# Patient Record
Sex: Male | Born: 1950 | Race: White | Hispanic: No | Smoking: Former smoker
Health system: Southern US, Community
[De-identification: ages and names within clinical notes are randomized; demographics above are authoritative.]

## PROBLEM LIST (undated history)

## (undated) DIAGNOSIS — D1721 Benign lipomatous neoplasm of skin and subcutaneous tissue of right arm: Secondary | ICD-10-CM

## (undated) DIAGNOSIS — K219 Gastro-esophageal reflux disease without esophagitis: Secondary | ICD-10-CM

## (undated) DIAGNOSIS — R0600 Dyspnea, unspecified: Secondary | ICD-10-CM

## (undated) DIAGNOSIS — I10 Essential (primary) hypertension: Secondary | ICD-10-CM

## (undated) DIAGNOSIS — I350 Nonrheumatic aortic (valve) stenosis: Secondary | ICD-10-CM

## (undated) DIAGNOSIS — I5022 Chronic systolic (congestive) heart failure: Secondary | ICD-10-CM

## (undated) HISTORY — DX: Nonrheumatic aortic (valve) stenosis: I35.0

## (undated) HISTORY — DX: Chronic systolic (congestive) heart failure: I50.22

## (undated) HISTORY — PX: HAND SURGERY: SHX662

---

## 2010-01-13 ENCOUNTER — Encounter: Payer: Self-pay | Admitting: Cardiology

## 2010-01-15 ENCOUNTER — Encounter: Payer: Self-pay | Admitting: Cardiology

## 2010-01-15 ENCOUNTER — Ambulatory Visit: Payer: Self-pay | Admitting: Cardiology

## 2010-01-25 ENCOUNTER — Ambulatory Visit: Payer: Self-pay | Admitting: Cardiology

## 2010-01-25 DIAGNOSIS — I10 Essential (primary) hypertension: Secondary | ICD-10-CM | POA: Insufficient documentation

## 2010-01-25 DIAGNOSIS — R42 Dizziness and giddiness: Secondary | ICD-10-CM | POA: Insufficient documentation

## 2010-01-25 DIAGNOSIS — I359 Nonrheumatic aortic valve disorder, unspecified: Secondary | ICD-10-CM | POA: Insufficient documentation

## 2010-02-24 ENCOUNTER — Ambulatory Visit: Payer: Self-pay | Admitting: Cardiology

## 2010-02-24 ENCOUNTER — Ambulatory Visit: Payer: Self-pay

## 2010-06-15 NOTE — Progress Notes (Signed)
Summary: Office Visit/ [facility]  Office Visit/ [facility]   Imported By: Dorise Hiss 01/25/2010 08:37:14  _____________________________________________________________________  External Attachment:    Type:   Image     Comment:   External Document

## 2010-06-15 NOTE — Assessment & Plan Note (Signed)
Summary: NP-AORTIC STENOSIS/LVH   Visit Type:  Initial Consult Primary Provider:  Lonzo Candy  CC:  Aortic Stenosis.  History of Present Illness: The patient presents for evaluation of an abnormal echocardiogram. He had a heart murmur and was recently sent for a stress echocardiogram which demonstrated a probable bicuspid aortic valve with a peak gradient of 34 with moderate to severe aortic stenosis. There was moderate left ventricular hypertrophy with a well-preserved ejection fraction. The mitral valve demonstrated annular calcification. The patient has not seen physicians frequently in the past. He has not known of a heart murmur. He has been active as a Chiropractor. With this activity he denies any chest pressure, neck or arm discomfort. He denies any shortness of breath, PND or orthopnea. He has had some rare episodes of lightheadedness that pass quickly and are infrequent. He has not had any presyncope or syncope. 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  Preventive Screening-Counseling & Management  Alcohol-Tobacco     Smoking Status: quit     Year Quit: 2008     Cans of tobacco/week: chews tobacco 2 pks/week     Tobacco Counseling: not to resume use of tobacco products  Current Medications (verified): 1)  Aspir-Low 81 Mg Tbec (Aspirin) .... Take 1 Tablet By Mouth Once A Day 2)  Vitamin C 500 Mg Tabs (Ascorbic Acid) .... Take 1 Tablet By Mouth Once A Day 3)  Multivitamins  Tabs (Multiple Vitamin) .... Take 1 Tablet By Mouth Once A Day  Allergies (verified): No Known Drug Allergies  Comments:  Nurse/Medical Assistant: The patient's medication list and allergies were reviewed with the patient and were updated in the Medication and Allergy Lists.  Past History:  Past Medical History: Aortic Stenosis  Past Surgical History: Left hand surgery following trauma  Family History: Father died of lung CA Mother heart murmur Brothers  died of lung CA  Social History: Chews tobacco 2 cans/per week Quit tobacco 3 years ago after 40 years 2 ppd No alcohol No drug use Married Scientist, water quality  Smoking Status:  quit Cans of tobacco/week:  chews tobacco 2 pks/week  Review of Systems       As stated in the HPI and negative for all other systems.   Vital Signs:  Patient profile:   60 year old male Height:      68 inches Weight:      164 pounds BMI:     25.03 Pulse rate:   99 / minute BP sitting:   145 / 85  (left arm) Cuff size:   regular  Vitals Entered By: Carlye Grippe (January 25, 2010 3:20 PM)  Nutrition Counseling: Patient's BMI is greater than 25 and therefore counseled on weight management options.  Physical Exam  General:  Well developed, well nourished, in no acute distress. Head:  normocephalic and atraumatic Eyes:  PERRLA/EOM intact; conjunctiva and lids normal. Mouth:  edentulous. Oral mucosa normal. Neck:  Neck supple, no JVD. No masses, thyromegaly or abnormal cervical nodes. Chest Wall:  no deformities or breast masses noted Lungs:  Clear bilaterally to auscultation and percussion. Abdomen:  Bowel sounds positive; abdomen soft and non-tender without masses, organomegaly, or hernias noted. No hepatosplenomegaly. Msk:  Back normal, normal gait. Muscle strength and tone normal. Extremities:  No clubbing or cyanosis. Neurologic:  Alert and oriented x 3. Skin:  Intact without lesions or rashes. Cervical Nodes:  no significant adenopathy Axillary Nodes:  no significant adenopathy  Inguinal Nodes:  no significant adenopathy Psych:  Normal affect.   Detailed Cardiovascular Exam  Neck    Carotids: Carotids full and equal bilaterally without bruits.      Neck Veins: Normal, no JVD.    Heart    Inspection: no deformities or lifts noted.      Palpation: normal PMI with no thrills palpable.      Auscultation: Probable opening snap, S1 and S2 within normal limits, no S3, no S4, 3/6 mid peaking  systolic murmur heard best at the apex and aortic outflow tract, no diastolic murmurs  Vascular    Abdominal Aorta: no palpable masses, pulsations, or audible bruits.      Femoral Pulses: normal femoral pulses bilaterally.      Pedal Pulses: normal pedal pulses bilaterally.      Radial Pulses: normal radial pulses bilaterally.      Peripheral Circulation: no clubbing, cyanosis, or edema noted with normal capillary refill.     EKG  Procedure date:  01/25/2010  Findings:      Sinus rhythm, rate 94, left gingiva hypertrophy, no acute ST-T wave changes  Impression & Recommendations:  Problem # 1:  AORTIC VALVE DISORDERS (ICD-424.1) The patient has moderately severe aortic stenosis.  I reviewed with the patient and his wife this anatomy in detail. It's unclear to me whether any of the dizziness he has described could be related to this. At this point I'm going to put him on an exercise treadmill to see if his exercise tolerance is truly as good as he suspects. If he does well with this I will plan followup echocardiography in 6 months or sooner if he has any symptoms. We discussed at length the symptoms that could develop when this becomes severe to the point of needing valve replacement. Orders: EKG w/ Interpretation (93000) GXT (GXT)  Problem # 2:  DIZZINESS AND GIDDINESS (ICD-780.4) As above  Problem # 3:  ESSENTIAL HYPERTENSION, BENIGN (ICD-401.1) His blood pressure is slightly elevated. I will observe this at the time of his treadmill as well.  Patient Instructions: 1)  Exercise stress test - in [employer] office 2)  Follow up based on results of above

## 2011-11-22 ENCOUNTER — Encounter: Payer: Self-pay | Admitting: Cardiology

## 2017-03-02 ENCOUNTER — Encounter: Payer: Self-pay | Admitting: Cardiology

## 2017-03-02 ENCOUNTER — Ambulatory Visit (INDEPENDENT_AMBULATORY_CARE_PROVIDER_SITE_OTHER): Payer: BLUE CROSS/BLUE SHIELD | Admitting: Cardiology

## 2017-03-02 VITALS — BP 122/82 | HR 94 | Ht 69.0 in | Wt 168.0 lb

## 2017-03-02 DIAGNOSIS — I350 Nonrheumatic aortic (valve) stenosis: Secondary | ICD-10-CM

## 2017-03-02 DIAGNOSIS — Z8774 Personal history of (corrected) congenital malformations of heart and circulatory system: Secondary | ICD-10-CM | POA: Diagnosis not present

## 2017-03-02 DIAGNOSIS — I10 Essential (primary) hypertension: Secondary | ICD-10-CM

## 2017-03-02 NOTE — Patient Instructions (Signed)
Medication Instructions:  Your physician recommends that you continue on your current medications as directed. Please refer to the Current Medication list given to you today.   Labwork: I will request labs from pcp  Testing/Procedures: Your physician has requested that you have an echocardiogram. Echocardiography is a painless test that uses sound waves to create images of your heart. It provides your doctor with information about the size and shape of your heart and how well your heart's chambers and valves are working. This procedure takes approximately one hour. There are no restrictions for this procedure.  Non-Cardiac CT Angiography (CTA), is a special type of CT scan that uses a computer to produce multi-dimensional views of major blood vessels throughout the body. In CT angiography, a contrast material is injected through an IV to help visualize the blood vessels   Follow-Up: Your physician recommends that you schedule a follow-up appointment in: to be determined    Any Other Special Instructions Will Be Listed Below (If Applicable).     If you need a refill on your cardiac medications before your next appointment, please call your pharmacy.

## 2017-03-02 NOTE — Progress Notes (Addendum)
Clinical Summary Jeremy Larson is a 66 y.o.male seen today as a new consult, referred by Dr Quillian Quince for history of bicuspid aortic valve and aortic stenosis.   1. Aortic stenosis/Bicuspid aortic valve - probable bicuspid AV by prior echo.  - echo 01/2010 LVEF 60-65%, bicuspid AV mod AS mean grad 34 and AVA VTI 1.12 - he has not followed up with cardiology in several years.  - DOE at < 1/2 block, DOE 1 flight of stairs. Can have some chest tightness with exertion. No syncope   SH: brick mason. His grandaughter is also a patient, [name], who also has a bicuspid aortic valve.    Past Medical History:  Diagnosis Date  . Aortic stenosis      Allergies not on file   Current Outpatient Prescriptions  Medication Sig Dispense Refill  . aspirin 81 MG tablet Take 81 mg by mouth daily.    . Multiple Vitamins-Minerals (MULTIVITAMIN PO) Take by mouth.    . vitamin C (ASCORBIC ACID) 500 MG tablet Take 500 mg by mouth daily.     No current facility-administered medications for this visit.      Past Surgical History:  Procedure Laterality Date  . HAND SURGERY     left hang following trauma     Allergies not on file    Family History  Problem Relation Age of Onset  . Lung cancer Unknown        brother and father  . Heart murmur Unknown        mother     Social History Mr. Reiland reports that he has quit smoking. He uses smokeless tobacco. Mr. Otting reports that he does not drink alcohol.   Review of Systems CONSTITUTIONAL: No weight loss, fever, chills, weakness or fatigue.  HEENT: Eyes: No visual loss, blurred vision, double vision or yellow sclerae.No hearing loss, sneezing, congestion, runny nose or sore throat.  SKIN: No rash or itching.  CARDIOVASCULAR: per hpi RESPIRATORY: per hpi GASTROINTESTINAL: No anorexia, nausea, vomiting or diarrhea. No abdominal pain or blood.  GENITOURINARY: No burning on urination, no polyuria NEUROLOGICAL: No headache, dizziness,  syncope, paralysis, ataxia, numbness or tingling in the extremities. No change in bowel or bladder control.  MUSCULOSKELETAL: No muscle, back pain, joint pain or stiffness.  LYMPHATICS: No enlarged nodes. No history of splenectomy.  PSYCHIATRIC: No history of depression or anxiety.  ENDOCRINOLOGIC: No reports of sweating, cold or heat intolerance. No polyuria or polydipsia.  Marland Kitchen   Physical Examination Vitals:   03/02/17 0855 03/02/17 0901  BP: 122/82 122/82  Pulse: 94   SpO2: 99%    Vitals:   03/02/17 0855  Weight: 168 lb (76.2 kg)  Height: 5\' 9"  (1.753 m)    Gen: resting comfortably, no acute distress HEENT: no scleral icterus, pupils equal round and reactive, no palptable cervical adenopathy,  CV: RRR, 3/6 systolic murmur rusb, no jvd Resp: Clear to auscultation bilaterally GI: abdomen is soft, non-tender, non-distended, normal bowel sounds, no hepatosplenomegaly MSK: extremities are warm, no edema.  Skin: warm, no rash Neuro:  no focal deficits Psych: appropriate affect    Assessment and Plan  1. Aortic stenosis/bicuspid aortic valve - recent symptoms likely related to progression of his aortic stenosis. We will repeat echo - in setting of bicuspid AV obtain CTA entire aorta to evaluate for underlying aortopathy.  - pending results may require further workup with LHC/RHC, possible valve intervention - EKG today sinus rhythm, LVH with strain pattern likely  due to AS   F/u pending test results.    11/201/18 addendum I have reviewed the risks, indications, and alternatives to cardiac catheterization, possible angioplasty, and stenting with the patient . Risks include but are not limited to bleeding, infection, vascular injury, stroke, myocardial infection, arrhythmia, kidney injury, radiation-related injury in the case of prolonged fluoroscopy use, emergency cardiac surgery, and death. The patient understands the risks of serious complication is 1-2 in 3154 with diagnostic  cardiac cath and 1-2% or less with angioplasty/stenting.   Arnoldo Lenis, M.D.

## 2017-03-07 ENCOUNTER — Other Ambulatory Visit (HOSPITAL_COMMUNITY): Payer: BLUE CROSS/BLUE SHIELD

## 2017-03-14 ENCOUNTER — Ambulatory Visit (HOSPITAL_COMMUNITY): Payer: BLUE CROSS/BLUE SHIELD

## 2017-03-23 ENCOUNTER — Ambulatory Visit (HOSPITAL_COMMUNITY): Payer: BLUE CROSS/BLUE SHIELD

## 2017-04-03 ENCOUNTER — Telehealth: Payer: Self-pay

## 2017-04-03 ENCOUNTER — Ambulatory Visit (HOSPITAL_COMMUNITY)
Admission: RE | Admit: 2017-04-03 | Discharge: 2017-04-03 | Disposition: A | Discharge status: Home / Self Care | Payer: Medicare Other | Source: Ambulatory Visit | Attending: Cardiology | Admitting: Cardiology

## 2017-04-03 DIAGNOSIS — I350 Nonrheumatic aortic (valve) stenosis: Secondary | ICD-10-CM

## 2017-04-03 DIAGNOSIS — I700 Atherosclerosis of aorta: Secondary | ICD-10-CM | POA: Insufficient documentation

## 2017-04-03 DIAGNOSIS — K4020 Bilateral inguinal hernia, without obstruction or gangrene, not specified as recurrent: Secondary | ICD-10-CM | POA: Diagnosis not present

## 2017-04-03 DIAGNOSIS — N281 Cyst of kidney, acquired: Secondary | ICD-10-CM | POA: Insufficient documentation

## 2017-04-03 DIAGNOSIS — I70208 Unspecified atherosclerosis of native arteries of extremities, other extremity: Secondary | ICD-10-CM | POA: Insufficient documentation

## 2017-04-03 DIAGNOSIS — Z01818 Encounter for other preprocedural examination: Secondary | ICD-10-CM

## 2017-04-03 DIAGNOSIS — Z8774 Personal history of (corrected) congenital malformations of heart and circulatory system: Secondary | ICD-10-CM

## 2017-04-03 LAB — ECHOCARDIOGRAM COMPLETE
AO mean calculated velocity dopler: 338 cm/s
AOVTI: 123 cm
AV Area VTI index: 0.2 cm2/m2
AV Area mean vel: 0.43 cm2
AV area mean vel ind: 0.22 cm2/m2
AVA: 0.39 cm2
AVAREAVTI: 0.4 cm2
AVCELMEANRAT: 0.12
AVG: 51 mmHg
AVLVOTPG: 1 mmHg
AVPG: 86 mmHg
AVPKVEL: 465 cm/s
Ao pk vel: 0.12 m/s
CHL CUP AV PEAK INDEX: 0.21
CHL CUP AV VEL: 0.39
CHL CUP MV DEC (S): 225
CHL CUP RV SYS PRESS: 27 mmHg
E decel time: 225 msec
EERAT: 16.89
FS: 16 % — AB (ref 28–44)
IVS/LV PW RATIO, ED: 1.03
LA ID, A-P, ES: 41 mm
LA diam end sys: 41 mm
LA vol index: 33.8 mL/m2
LADIAMINDEX: 2.12 cm/m2
LAVOL: 65.3 mL
LAVOLA4C: 57 mL
LV PW d: 13.6 mm — AB (ref 0.6–1.1)
LV SIMPSON'S DISK: 38
LV TDI E'MEDIAL: 4.46
LV dias vol index: 73 mL/m2
LV dias vol: 142 mL (ref 62–150)
LV sys vol index: 46 mL/m2
LV sys vol: 88 mL — AB
LVEEAVG: 16.89
LVEEMED: 16.89
LVELAT: 4.24 cm/s
LVOT VTI: 13.8 cm
LVOT area: 3.46 cm2
LVOT diameter: 21 mm
LVOT peak VTI: 0.11 cm
LVOT peak vel: 54.3 cm/s
LVOTSV: 48 mL
MV Peak grad: 2 mmHg
MV pk A vel: 102 m/s
MV pk E vel: 71.6 m/s
RV LATERAL S' VELOCITY: 6.85 cm/s
Reg peak vel: 247 cm/s
Stroke v: 54 ml
TAPSE: 14.8 mm
TDI e' lateral: 4.24
TR max vel: 247 cm/s
Valve area index: 0.2

## 2017-04-03 LAB — POCT I-STAT CREATININE: CREATININE: 1.3 mg/dL — AB (ref 0.61–1.24)

## 2017-04-03 MED ORDER — LISINOPRIL 2.5 MG PO TABS: 2.5000 mg | ORAL_TABLET | Freq: Every day | ORAL | 3 refills | Status: DC

## 2017-04-03 MED ORDER — IOPAMIDOL (ISOVUE-370) INJECTION 76%
100.0000 mL | Freq: Once | INTRAVENOUS | Status: AC | PRN
  Administered 2017-04-03: 100 mL via INTRAVENOUS

## 2017-04-03 NOTE — Telephone Encounter (Signed)
Pt returned call. I advised him of test results and next step. I will schedule cath. & send  In lisinopril 2.5 mg  To [facility].

## 2017-04-03 NOTE — Telephone Encounter (Signed)
Returned pt call. Informed him of Cath instructions. He will have labs done Wednesday 11/21

## 2017-04-03 NOTE — Telephone Encounter (Signed)
Jeremy Larson called back requesting to speak with Jeremy Larson. Please call patient.

## 2017-04-03 NOTE — Telephone Encounter (Signed)
Called pt. No answer. Left message for pt. To return call.  

## 2017-04-03 NOTE — Telephone Encounter (Signed)
-----   Message from Jeremy Lenis, MD sent at 04/03/2017 12:12 PM EST ----- Echo shows that his aortic valve is severely thickened and not opening well, this is putting strain on the heart and has caused his heart muscle/pump to become weak. Please start lisinopril 2.5mg  daily, this medicine can help strengthen his heart. Ultimately he is going to need to have his heart valve replaced in the very near future. Please arrange a LHC/RHC with Dr Burt Knack or [facility] for severe aortic stenosis, this is the next step in workup getting him ready for possible valve replacement.    Carlyle Dolly MD

## 2017-04-03 NOTE — Telephone Encounter (Signed)
-----   Message from Arnoldo Lenis, MD sent at 04/03/2017 12:12 PM EST ----- Echo shows that his aortic valve is severely thickened and not opening well, this is putting strain on the heart and has caused his heart muscle/pump to become weak. Please start lisinopril 2.5mg  daily, this medicine can help strengthen his heart. Ultimately he is going to need to have his heart valve replaced in the very near future. Please arrange a LHC/RHC with Dr Burt Knack or [facility] for severe aortic stenosis, this is the next step in workup getting him ready for possible valve replacement.    Carlyle Dolly MD

## 2017-04-03 NOTE — Progress Notes (Signed)
*  PRELIMINARY RESULTS* Echocardiogram 2D Echocardiogram has been performed.  Jeremy Larson 04/03/2017, 9:59 AM

## 2017-04-04 ENCOUNTER — Other Ambulatory Visit: Payer: Self-pay | Admitting: Cardiology

## 2017-04-04 DIAGNOSIS — I350 Nonrheumatic aortic (valve) stenosis: Secondary | ICD-10-CM

## 2017-04-05 ENCOUNTER — Other Ambulatory Visit (HOSPITAL_COMMUNITY)
Admission: RE | Admit: 2017-04-05 | Discharge: 2017-04-05 | Disposition: A | Discharge status: Home / Self Care | Payer: Medicare Other | Source: Ambulatory Visit | Attending: Cardiology | Admitting: Cardiology

## 2017-04-05 DIAGNOSIS — Z01818 Encounter for other preprocedural examination: Secondary | ICD-10-CM | POA: Diagnosis present

## 2017-04-05 LAB — BASIC METABOLIC PANEL
Anion gap: 7 (ref 5–15)
BUN: 15 mg/dL (ref 6–20)
CALCIUM: 9.6 mg/dL (ref 8.9–10.3)
CO2: 27 mmol/L (ref 22–32)
CREATININE: 1.27 mg/dL — AB (ref 0.61–1.24)
Chloride: 102 mmol/L (ref 101–111)
GFR calc non Af Amer: 58 mL/min — ABNORMAL LOW (ref >60)
Glucose, Bld: 88 mg/dL (ref 65–99)
Potassium: 4.5 mmol/L (ref 3.5–5.1)
SODIUM: 136 mmol/L (ref 135–145)

## 2017-04-05 LAB — CBC WITH DIFFERENTIAL/PLATELET
BASOS PCT: 0 %
Basophils Absolute: 0 10*3/uL (ref 0.0–0.1)
EOS ABS: 0.2 10*3/uL (ref 0.0–0.7)
EOS PCT: 4 %
HEMATOCRIT: 46.1 % (ref 39.0–52.0)
HEMOGLOBIN: 15.1 g/dL (ref 13.0–17.0)
Lymphocytes Relative: 38 %
Lymphs Abs: 2.2 10*3/uL (ref 0.7–4.0)
MCH: 31.5 pg (ref 26.0–34.0)
MCHC: 32.8 g/dL (ref 30.0–36.0)
MCV: 96 fL (ref 78.0–100.0)
MONOS PCT: 13 %
Monocytes Absolute: 0.8 10*3/uL (ref 0.1–1.0)
NEUTROS PCT: 45 %
Neutro Abs: 2.6 10*3/uL (ref 1.7–7.7)
Platelets: 223 10*3/uL (ref 150–400)
RBC: 4.8 MIL/uL (ref 4.22–5.81)
RDW: 13 % (ref 11.5–15.5)
WBC: 5.9 10*3/uL (ref 4.0–10.5)

## 2017-04-05 LAB — PROTIME-INR
INR: 0.89
Prothrombin Time: 11.9 seconds (ref 11.4–15.2)

## 2017-04-11 ENCOUNTER — Telehealth: Payer: Self-pay

## 2017-04-11 NOTE — Telephone Encounter (Signed)
Patient contacted pre-catheterization at [facility] scheduled for:  04/12/2017 @ 1130 Verified arrival time and place:  NT @ 0900 Confirmed AM meds to be taken pre-cath with sip of water: Take ASA Confirmed patient has responsible person to drive home post procedure and observe patient for 24 hours: yes Addl concerns:  none

## 2017-04-12 ENCOUNTER — Ambulatory Visit (HOSPITAL_COMMUNITY)
Admission: RE | Admit: 2017-04-12 | Discharge: 2017-04-12 | Disposition: A | Discharge status: Home / Self Care | Payer: Medicare Other | Source: Ambulatory Visit | Attending: Cardiovascular Disease | Admitting: Cardiovascular Disease

## 2017-04-12 ENCOUNTER — Encounter (HOSPITAL_COMMUNITY)
Admission: RE | Disposition: A | Discharge status: Home / Self Care | Payer: Self-pay | Source: Ambulatory Visit | Attending: Cardiovascular Disease

## 2017-04-12 DIAGNOSIS — I2510 Atherosclerotic heart disease of native coronary artery without angina pectoris: Secondary | ICD-10-CM | POA: Insufficient documentation

## 2017-04-12 DIAGNOSIS — Z720 Tobacco use: Secondary | ICD-10-CM | POA: Diagnosis not present

## 2017-04-12 DIAGNOSIS — Z7982 Long term (current) use of aspirin: Secondary | ICD-10-CM | POA: Diagnosis not present

## 2017-04-12 DIAGNOSIS — I428 Other cardiomyopathies: Secondary | ICD-10-CM | POA: Insufficient documentation

## 2017-04-12 DIAGNOSIS — I350 Nonrheumatic aortic (valve) stenosis: Secondary | ICD-10-CM | POA: Diagnosis present

## 2017-04-12 HISTORY — PX: RIGHT/LEFT HEART CATH AND CORONARY ANGIOGRAPHY: CATH118266

## 2017-04-12 LAB — POCT I-STAT 3, ART BLOOD GAS (G3+)
Acid-Base Excess: 2 mmol/L (ref 0.0–2.0)
Bicarbonate: 28.1 mmol/L — ABNORMAL HIGH (ref 20.0–28.0)
O2 SAT: 94 %
PH ART: 7.374 (ref 7.350–7.450)
TCO2: 30 mmol/L (ref 22–32)
pCO2 arterial: 48.2 mmHg — ABNORMAL HIGH (ref 32.0–48.0)
pO2, Arterial: 75 mmHg — ABNORMAL LOW (ref 83.0–108.0)

## 2017-04-12 LAB — POCT I-STAT 3, VENOUS BLOOD GAS (G3P V)
Acid-Base Excess: 1 mmol/L (ref 0.0–2.0)
BICARBONATE: 27.3 mmol/L (ref 20.0–28.0)
O2 SAT: 65 %
PCO2 VEN: 50.5 mmHg (ref 44.0–60.0)
PH VEN: 7.341 (ref 7.250–7.430)
PO2 VEN: 36 mmHg (ref 32.0–45.0)
TCO2: 29 mmol/L (ref 22–32)

## 2017-04-12 SURGERY — RIGHT/LEFT HEART CATH AND CORONARY ANGIOGRAPHY
Anesthesia: LOCAL

## 2017-04-12 MED ORDER — SODIUM CHLORIDE 0.9% FLUSH: 3.0000 mL | INTRAVENOUS | Status: DC | PRN

## 2017-04-12 MED ORDER — SODIUM CHLORIDE 0.9% FLUSH: 3.0000 mL | Freq: Two times a day (BID) | INTRAVENOUS | Status: DC

## 2017-04-12 MED ORDER — LIDOCAINE HCL (PF) 1 % IJ SOLN
INTRAMUSCULAR | Status: AC
  Filled 2017-04-12: qty 30

## 2017-04-12 MED ORDER — SODIUM CHLORIDE 0.9 % IV SOLN: 250.0000 mL | INTRAVENOUS | Status: DC | PRN

## 2017-04-12 MED ORDER — HEPARIN SODIUM (PORCINE) 1000 UNIT/ML IJ SOLN
INTRAMUSCULAR | Status: DC | PRN
  Administered 2017-04-12: 4000 [IU] via INTRAVENOUS

## 2017-04-12 MED ORDER — LIDOCAINE HCL (PF) 1 % IJ SOLN
INTRAMUSCULAR | Status: DC | PRN
  Administered 2017-04-12: 8 mL via SUBCUTANEOUS

## 2017-04-12 MED ORDER — VERAPAMIL HCL 2.5 MG/ML IV SOLN
INTRAVENOUS | Status: DC | PRN
  Administered 2017-04-12: 10 mL via INTRA_ARTERIAL

## 2017-04-12 MED ORDER — HEPARIN (PORCINE) IN NACL 2-0.9 UNIT/ML-% IJ SOLN
INTRAMUSCULAR | Status: AC | PRN
  Administered 2017-04-12: 1000 mL

## 2017-04-12 MED ORDER — HEPARIN (PORCINE) IN NACL 2-0.9 UNIT/ML-% IJ SOLN
INTRAMUSCULAR | Status: AC
  Filled 2017-04-12: qty 1000

## 2017-04-12 MED ORDER — IOPAMIDOL (ISOVUE-370) INJECTION 76%
INTRAVENOUS | Status: AC
  Filled 2017-04-12: qty 100

## 2017-04-12 MED ORDER — SODIUM CHLORIDE 0.9 % IV SOLN: INTRAVENOUS | Status: AC

## 2017-04-12 MED ORDER — FENTANYL CITRATE (PF) 100 MCG/2ML IJ SOLN
INTRAMUSCULAR | Status: AC
  Filled 2017-04-12: qty 2

## 2017-04-12 MED ORDER — VERAPAMIL HCL 2.5 MG/ML IV SOLN
INTRAVENOUS | Status: AC
  Filled 2017-04-12: qty 2

## 2017-04-12 MED ORDER — SODIUM CHLORIDE 0.9 % IV SOLN
INTRAVENOUS | Status: DC
  Administered 2017-04-12: 10:00:00 via INTRAVENOUS

## 2017-04-12 MED ORDER — ASPIRIN 81 MG PO CHEW
CHEWABLE_TABLET | ORAL | Status: AC
  Administered 2017-04-12: 81 mg via ORAL
  Filled 2017-04-12: qty 1

## 2017-04-12 MED ORDER — FENTANYL CITRATE (PF) 100 MCG/2ML IJ SOLN
INTRAMUSCULAR | Status: DC | PRN
  Administered 2017-04-12: 50 ug via INTRAVENOUS
  Administered 2017-04-12: 25 ug via INTRAVENOUS

## 2017-04-12 MED ORDER — MIDAZOLAM HCL 2 MG/2ML IJ SOLN
INTRAMUSCULAR | Status: AC
  Filled 2017-04-12: qty 2

## 2017-04-12 MED ORDER — ASPIRIN 81 MG PO CHEW
81.0000 mg | CHEWABLE_TABLET | ORAL | Status: AC
  Administered 2017-04-12: 81 mg via ORAL

## 2017-04-12 MED ORDER — MIDAZOLAM HCL 2 MG/2ML IJ SOLN
INTRAMUSCULAR | Status: DC | PRN
  Administered 2017-04-12: 1 mg via INTRAVENOUS
  Administered 2017-04-12: 2 mg via INTRAVENOUS

## 2017-04-12 MED ORDER — IOPAMIDOL (ISOVUE-370) INJECTION 76%
INTRAVENOUS | Status: DC | PRN
  Administered 2017-04-12: 90 mL via INTRA_ARTERIAL

## 2017-04-12 SURGICAL SUPPLY — 16 items
CATH BALLN WEDGE 5F 110CM (CATHETERS) ×2 IMPLANT
CATH INFINITI 5 FR AL2 (CATHETERS) ×2 IMPLANT
CATH INFINITI 5 FR JL3.5 (CATHETERS) ×4 IMPLANT
CATH INFINITI 5FR AL1 (CATHETERS) ×2 IMPLANT
CATH INFINITI JR4 5F (CATHETERS) ×2 IMPLANT
DEVICE RAD COMP TR BAND LRG (VASCULAR PRODUCTS) ×2 IMPLANT
GLIDESHEATH SLEND SS 6F .021 (SHEATH) ×2 IMPLANT
GUIDEWIRE .025 260CM (WIRE) ×2 IMPLANT
GUIDEWIRE INQWIRE 1.5J.035X260 (WIRE) ×1 IMPLANT
INQWIRE 1.5J .035X260CM (WIRE) ×2
KIT HEART LEFT (KITS) ×2 IMPLANT
PACK CARDIAC CATHETERIZATION (CUSTOM PROCEDURE TRAY) ×2 IMPLANT
SHEATH GLIDE SLENDER 4/5FR (SHEATH) ×2 IMPLANT
TRANSDUCER W/STOPCOCK (MISCELLANEOUS) ×2 IMPLANT
TUBING CIL FLEX 10 FLL-RA (TUBING) ×2 IMPLANT
WIRE EMERALD ST .035X260CM (WIRE) ×2 IMPLANT

## 2017-04-12 NOTE — Interval H&P Note (Signed)
History and Physical Interval Note:  04/12/2017 10:11 AM  Jeremy Larson  has presented today for cardiac cath with the diagnosis of severe AS. The various methods of treatment have been discussed with the patient and family. After consideration of risks, benefits and other options for treatment, the patient has consented to  Procedure(s): RIGHT/LEFT HEART CATH AND CORONARY ANGIOGRAPHY (N/A) as a surgical intervention .  The patient's history has been reviewed, patient examined, no change in status, stable for surgery.  I have reviewed the patient's chart and labs.  Questions were answered to the patient's satisfaction.    Cath Lab Visit (complete for each Cath Lab visit)  Clinical Evaluation Leading to the Procedure:   ACS: No.  Non-ACS:    Anginal Classification: CCS III  Anti-ischemic medical therapy: Minimal Therapy (1 class of medications)  Non-Invasive Test Results: No non-invasive testing performed  Prior CABG: No previous CABG         Lauree Chandler

## 2017-04-12 NOTE — H&P (Signed)
Cardiology Admission History and Physical:   Patient ID: Jeremy Larson; MRN: [id number]; DOB: 1950/10/31   Admission date: 04/12/2017  Primary Care Provider: Caryl Bis, MD Primary Cardiologist: Branch  Chief Complaint:    Patient Profile:   Jeremy Larson is a 66 y.o. male with a history of bicuspid aortic valve with aortic stenosis referred today for cardiac cath in workup of severe AS. Most recent echo 04/03/17 with LVEF=35-40% with severe AS, mean gradient 51 mmHg, peak 86 mmHg, AVA 0.39 cm2). He reports dyspnea and chest pain with exertion.   History of Present Illness:    Jeremy Larson is a 66 y.o. male with a history of bicuspid aortic valve with aortic stenosis referred today for cardiac cath in workup of severe AS. Most recent echo 04/03/17 with LVEF=35-40% with severe AS, mean gradient 51 mmHg, peak 86 mmHg, AVA 0.39 cm2). He reports dyspnea and chest pain with exertion.    Past Medical History:  Diagnosis Date  . Aortic stenosis     Past Surgical History:  Procedure Laterality Date  . HAND SURGERY     left hang following trauma     Medications Prior to Admission: Prior to Admission medications   Medication Sig Start Date End Date Taking? Authorizing Provider  aspirin 81 MG tablet Take 81 mg by mouth daily.   Yes [provider]  lisinopril (PRINIVIL,ZESTRIL) 2.5 MG tablet Take 1 tablet (2.5 mg total) daily by mouth. 04/03/17 07/02/17 Yes Branch, Alphonse Guild, MD  loratadine (CLARITIN) 10 MG tablet Take 10 mg daily by mouth.   Yes [provider]  metoprolol succinate (TOPROL-XL) 25 MG 24 hr tablet TAKE 25 MG BY MOUTH DAILY FOR BLOOD PRESSURE 02/24/17  Yes [provider]     Allergies:   No Known Allergies  Social History:   Social History   Socioeconomic History  . Marital status: Married    Spouse name: Not on file  . Number of children: Not on file  . Years of education: Not on file  . Highest education level: Not on  file  Social Needs  . Financial resource strain: Not on file  . Food insecurity - worry: Not on file  . Food insecurity - inability: Not on file  . Transportation needs - medical: Not on file  . Transportation needs - non-medical: Not on file  Occupational History  . Not on file  Tobacco Use  . Smoking status: Former Research scientist (life sciences)  . Smokeless tobacco: Current User  Substance and Sexual Activity  . Alcohol use: No  . Drug use: No  . Sexual activity: Not on file  Other Topics Concern  . Not on file  Social History Narrative  . Not on file    Family History:   The patient's family history includes Alcohol abuse in his brother; Cancer in his brother, father, and mother; Cirrhosis in his brother; Heart failure in his mother; Heart murmur in his unknown relative; Lung cancer in his unknown relative.    ROS:  Please see the history of present illness.  All other ROS reviewed and negative.     Physical Exam/Data:   Vitals:   04/12/17 0857  BP: 123/84  Pulse: 76  Resp: 18  Temp: 98 F (36.7 C)  TempSrc: Oral  SpO2: 100%  Weight: 175 lb (79.4 kg)  Height: 5\' 9"  (1.753 m)   No intake or output data in the 24 hours ending 04/12/17 [place]   04/12/17 0857  Weight: 175 lb (79.4 kg)   Body mass index is 25.84 kg/m. General:  Well nourished, well developed, in no acute distress HEENT: normal Lymph: no adenopathy Neck: no JVD Endocrine:  No thryomegaly Vascular: No carotid bruits; FA pulses 2+ bilaterally without bruits  Cardiac:  normal S1, S2; RRR; systolic murmur Lungs:  clear to auscultation bilaterally, no wheezing, rhonchi or rales  Abd: soft, nontender, no hepatomegaly  Ext: no edema Musculoskeletal:  No deformities, BUE and BLE strength normal and equal Skin: warm and dry  Neuro:  CNs 2-12 intact, no focal abnormalities noted Psych:  Normal affect   Laboratory Data:  Radiology/Studies:  No results found.  Assessment and Plan:   1. Severe AS: Right  and left heart cath today to assess pressures and exclude CAD before surgical referral. Will likely be candidate for surgical AVR and not TAVR.   Severity of Illness: The appropriate patient status for this patient is OBSERVATION. Observation status is judged to be reasonable and necessary in order to provide the required intensity of service to ensure the patient's safety. The patient's presenting symptoms, physical exam findings, and initial radiographic and laboratory data in the context of their medical condition is felt to place them at decreased risk for further clinical deterioration. Furthermore, it is anticipated that the patient will be medically stable for discharge from the hospital within 2 midnights of admission. The following factors support the patient status of observation.   " The patient's presenting symptoms include dyspnea. " The physical exam findings include systolic murmur      For questions or updates, please contact [place] Please consult [email or web address] for contact info under Cardiology/STEMI.    Signed, Lauree Chandler, MD  04/12/2017 10:05 AM

## 2017-04-12 NOTE — Discharge Instructions (Signed)

## 2017-04-13 ENCOUNTER — Encounter (HOSPITAL_COMMUNITY): Payer: Self-pay | Admitting: Cardiovascular Disease

## 2017-04-19 ENCOUNTER — Other Ambulatory Visit: Payer: Self-pay

## 2017-04-19 ENCOUNTER — Ambulatory Visit (INDEPENDENT_AMBULATORY_CARE_PROVIDER_SITE_OTHER): Payer: Medicare Other | Admitting: Cardiology

## 2017-04-19 ENCOUNTER — Encounter: Payer: Self-pay | Admitting: Cardiology

## 2017-04-19 VITALS — BP 142/80 | HR 94 | Ht 69.0 in | Wt 172.0 lb

## 2017-04-19 DIAGNOSIS — I5022 Chronic systolic (congestive) heart failure: Secondary | ICD-10-CM | POA: Diagnosis not present

## 2017-04-19 DIAGNOSIS — I350 Nonrheumatic aortic (valve) stenosis: Secondary | ICD-10-CM

## 2017-04-19 DIAGNOSIS — Z8774 Personal history of (corrected) congenital malformations of heart and circulatory system: Secondary | ICD-10-CM

## 2017-04-19 MED ORDER — METOPROLOL SUCCINATE ER 25 MG PO TB24: 37.5000 mg | ORAL_TABLET | Freq: Every day | ORAL | 1 refills | Status: DC

## 2017-04-19 NOTE — Progress Notes (Signed)
Clinical Summary Mr. Popwell is a 66 y.o.male seen today for follow up of the following medical problems.   1. Aortic stenosis/Bicuspid aortic valve - probable bicuspid AV by prior echo.  - echo 01/2010 LVEF 60-65%, bicuspid AV mod AS mean grad 34 and AVA VTI 1.12 - he has not followed up with cardiology in several years.  - DOE at < 1/2 block, DOE 1 flight of stairs. Can have some chest tightness with exertion. No syncope   03/2017 echo: LVEF 58-09%, grade I diastolic dysfunction, AV mean grad 51, AVA VTI 0.39.  03/2017 cath: mild nonsobstructive CAD. Normal filling pressures by RHC 03/2017 CTA Chest/Abd/Pelvis: no aortic pathology  - since last visit stable SOB/DOE.    2. Chronic systolic HF - echo LVEF 98-33% - NICM, likely related to his severe AS  - stable SOB/DOE since last visit. No recent LE edema.      SH: brick mason. His grandaughter is also a patient, [name], who also has a bicuspid aortic valve.    Past Medical History:  Diagnosis Date  . Aortic stenosis      No Known Allergies   Current Outpatient Medications  Medication Sig Dispense Refill  . aspirin 81 MG tablet Take 81 mg by mouth daily.    Marland Kitchen lisinopril (PRINIVIL,ZESTRIL) 2.5 MG tablet Take 1 tablet (2.5 mg total) daily by mouth. 90 tablet 3  . loratadine (CLARITIN) 10 MG tablet Take 10 mg daily by mouth.    . metoprolol succinate (TOPROL-XL) 25 MG 24 hr tablet TAKE 25 MG BY MOUTH DAILY FOR BLOOD PRESSURE  0   No current facility-administered medications for this visit.      Past Surgical History:  Procedure Laterality Date  . HAND SURGERY     left hang following trauma  . RIGHT/LEFT HEART CATH AND CORONARY ANGIOGRAPHY N/A 04/12/2017   Procedure: RIGHT/LEFT HEART CATH AND CORONARY ANGIOGRAPHY;  Surgeon: Burnell Blanks, MD;  Location: [facility];  Service: Cardiovascular;  Laterality: N/A;     No Known Allergies    Family History  Problem Relation Age of Onset   . Lung cancer Unknown        brother and father  . Heart murmur Unknown        mother  . Cancer Mother   . Heart failure Mother   . Cancer Father   . Cancer Brother   . Cirrhosis Brother   . Alcohol abuse Brother      Social History Mr. Petrak reports that he has quit smoking. He uses smokeless tobacco. Mr. Mcinerny reports that he does not drink alcohol.   Review of Systems CONSTITUTIONAL: No weight loss, fever, chills, weakness or fatigue.  HEENT: Eyes: No visual loss, blurred vision, double vision or yellow sclerae.No hearing loss, sneezing, congestion, runny nose or sore throat.  SKIN: No rash or itching.  CARDIOVASCULAR: per hpi RESPIRATORY: per hpi GASTROINTESTINAL: No anorexia, nausea, vomiting or diarrhea. No abdominal pain or blood.  GENITOURINARY: No burning on urination, no polyuria NEUROLOGICAL: No headache, dizziness, syncope, paralysis, ataxia, numbness or tingling in the extremities. No change in bowel or bladder control.  MUSCULOSKELETAL: No muscle, back pain, joint pain or stiffness.  LYMPHATICS: No enlarged nodes. No history of splenectomy.  PSYCHIATRIC: No history of depression or anxiety.  ENDOCRINOLOGIC: No reports of sweating, cold or heat intolerance. No polyuria or polydipsia.  Marland Kitchen   Physical Examination Vitals:   04/19/17 1435  BP: (!) 142/80  Pulse: 94  SpO2: 96%   Vitals:   04/19/17 1435  Weight: 172 lb (78 kg)  Height: 5\' 9"  (0.240 m)    Gen: resting comfortably, no acute distress HEENT: no scleral icterus, pupils equal round and reactive, no palptable cervical adenopathy,  CV: RRR, 3/6 systolic murmur rusb, no jvd Resp: Clear to auscultation bilaterally GI: abdomen is soft, non-tender, non-distended, normal bowel sounds, no hepatosplenomegaly MSK: extremities are warm, no edema.  Skin: warm, no rash Neuro:  no focal deficits Psych: appropriate affect   Diagnostic Studies 03/2017 echo Study Conclusions  - Left ventricle: The  cavity size was normal. Wall thickness was   increased in a pattern of moderate LVH. Systolic function was   moderately reduced. The estimated ejection fraction was in the   range of 35% to 40%. Diffuse hypokinesis. Doppler parameters are   consistent with abnormal left ventricular relaxation (grade 1   diastolic dysfunction). Doppler parameters are consistent with   high ventricular filling pressure. - Aortic valve: Severely calcified annulus. Severely thickened   leaflets. There was severe stenosis. Mean gradient (S): 51 mm Hg.   VTI ratio of LVOT to aortic valve: 0.11. Valve area (VTI): 0.39   cm^2. Valve area (Vmax): 0.4 cm^2. Valve area (Vmean): 0.43 cm^2. - Left atrium: The atrium was mildly dilated. - Technically adequate study.  03/2017 Heart cath  Prox RCA lesion is 10% stenosed.  Ost 2nd Mrg to 2nd Mrg lesion is 10% stenosed.  Prox LAD to Mid LAD lesion is 20% stenosed.   1. Mild non-obstructive CAD 2. Severe aortic stenosis by echo (unable to cross the aortic valve from the radial approach).   Recommendations: He will need referral to CT surgery to discuss surgical AVR. He is low risk for surgery and will not be a candidate for TAVR if his surgical risk is felt to be low.     Assessment and Plan  1. Aortic stenosis/bicuspid aortic valve - severe symptomatic AS with systolic dysfunction - patient is reluctant to pursue AVR at this time, we spoke in detail the poor prognosis without intervention - we will refer to CT surgery for evaluation  2. Chronic systolic HF - increase Toprol XL to 37.5mg  daily   F/u 1 month      Arnoldo Lenis, M.D.

## 2017-04-19 NOTE — Patient Instructions (Signed)
Your physician recommends that you schedule a follow-up appointment in: Scott has recommended you make the following change in your medication:   CHANGE TOPROL XL 37.5 MG (1 AND 1/2 TABLETS) DAILY  You have been referred to CT SURGERY   Thank you for choosing [facility]!!

## 2017-04-22 ENCOUNTER — Encounter: Payer: Self-pay | Admitting: Cardiology

## 2017-04-27 ENCOUNTER — Institutional Professional Consult (permissible substitution) (INDEPENDENT_AMBULATORY_CARE_PROVIDER_SITE_OTHER): Payer: Medicare Other | Admitting: Surgery

## 2017-04-27 ENCOUNTER — Other Ambulatory Visit: Payer: Self-pay

## 2017-04-27 ENCOUNTER — Encounter: Payer: Self-pay | Admitting: Surgery

## 2017-04-27 VITALS — BP 117/82 | HR 77 | Resp 20 | Ht 69.0 in | Wt 175.0 lb

## 2017-04-27 DIAGNOSIS — I350 Nonrheumatic aortic (valve) stenosis: Secondary | ICD-10-CM

## 2017-04-28 ENCOUNTER — Encounter: Payer: Self-pay | Admitting: Surgery

## 2017-04-28 ENCOUNTER — Other Ambulatory Visit: Payer: Self-pay

## 2017-04-28 DIAGNOSIS — I350 Nonrheumatic aortic (valve) stenosis: Secondary | ICD-10-CM

## 2017-04-28 NOTE — Progress Notes (Signed)
Cardiothoracic Surgery Consultation  PCP is Caryl Bis, MD Referring Provider is Burnell Blanks*  Chief Complaint  Patient presents with  . New Patient (Initial Visit)    SAVR vs TAVR, severe aortic stenosis    HPI:  The patient is a 66 year old gentleman with a long history of a heart murmur and known bicuspid aortic valve found on echocardiogram in 2011. He was seen by Dr. Percival Spanish at that time and an echocardiogram in 01/2010 showed an ejection fraction of 60-65% with a mean gradient across aortic valve of 34 mmHg and a valve area of 1.12 cm.  He denies any symptoms at that time and subsequently underwent an exercise tolerance test which was unremarkable.  A decision was made to repeat his echocardiogram in 6 months but he did not return for follow-up.  In October 2018 he was referred to Dr. Harl Bowie by his PCP, Dr. Quillian Quince, for further evaluation.  He reports that he has been having shortness of breath with moderate exertion such as walking less than one half block or up one flight of stairs.  He has had some associated chest tightness.  He denies any dizziness or syncope.  He has had fatigue.  He underwent repeat echocardiogram on 04/03/2017 which showed severe aortic stenosis with a mean gradient of 51 mmHg and a peak gradient of 86 mmHg.  The dimensionless index was 0.12.  There was severe aortic annular calcification and severe thickening and calcification of the leaflets.  The ejection fraction was moderately reduced to 35-40% with diffuse hypokinesis and grade 1 diastolic dysfunction.  His echo in 2011 had shown normal left ventricular ejection fraction of 60-65%.  Cardiac catheterization was performed by Dr. Angelena Form on 04/12/2017 and this showed mild nonobstructive coronary disease.  The patient is here today with his wife.  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  He remains quite active although he has recently had to scale back his  activity to avoid symptoms.  He is 1 of 7 brothers and his family and does not know if any of them have aortic valve disease.  He does have a granddaughter who has bicuspid aortic valve.  Past Medical History:  Diagnosis Date  . Aortic stenosis     Past Surgical History:  Procedure Laterality Date  . HAND SURGERY     left hang following trauma  . RIGHT/LEFT HEART CATH AND CORONARY ANGIOGRAPHY N/A 04/12/2017   Procedure: RIGHT/LEFT HEART CATH AND CORONARY ANGIOGRAPHY;  Surgeon: Burnell Blanks, MD;  Location: [facility];  Service: Cardiovascular;  Laterality: N/A;    Family History  Problem Relation Age of Onset  . Lung cancer Unknown        brother and father  . Heart murmur Unknown        mother  . Cancer Mother   . Heart failure Mother   . Cancer Father   . Cancer Brother   . Cirrhosis Brother   . Alcohol abuse Brother     Social History Social History   Tobacco Use  . Smoking status: Former Smoker    Last attempt to quit: 1958    Years since quitting: 60.9  . Smokeless tobacco: Current User  Substance Use Topics  . Alcohol use: No  . Drug use: No    Current Outpatient Medications  Medication Sig Dispense Refill  . aspirin 81 MG tablet Take 81 mg by mouth daily.    Marland Kitchen  lisinopril (PRINIVIL,ZESTRIL) 2.5 MG tablet Take 1 tablet (2.5 mg total) daily by mouth. 90 tablet 3  . loratadine (CLARITIN) 10 MG tablet Take 10 mg daily by mouth.    . metoprolol succinate (TOPROL XL) 25 MG 24 hr tablet Take 1.5 tablets (37.5 mg total) by mouth daily. 135 tablet 1   No current facility-administered medications for this visit.     No Known Allergies  Review of Systems  Constitutional: Positive for activity change and fatigue.  HENT: Negative.        Full dentures  Eyes: Negative.   Respiratory: Positive for shortness of breath.   Cardiovascular: Positive for chest pain. Negative for leg swelling.  Gastrointestinal: Negative.   Endocrine: Negative.     Genitourinary: Negative.   Musculoskeletal: Negative.   Allergic/Immunologic: Negative.   Neurological: Negative for dizziness and syncope.  Hematological: Negative.   Psychiatric/Behavioral: Negative.     BP 117/82 (BP Location: Right Arm, Patient Position: Sitting, Cuff Size: Large)   Pulse 77   Resp 20   Ht 5\' 9"  (1.753 m)   Wt 175 lb (79.4 kg)   SpO2 99% Comment: on RA  BMI 25.84 kg/m  Physical Exam  Constitutional: He is oriented to person, place, and time. He appears well-developed and well-nourished. No distress.  HENT:  Head: Normocephalic and atraumatic.  Mouth/Throat: Oropharynx is clear and moist.  Eyes: Conjunctivae and EOM are normal. Pupils are equal, round, and reactive to light.  Neck: Normal range of motion. Neck supple. No JVD present. No thyromegaly present.  Cardiovascular: Normal rate, regular rhythm and intact distal pulses.  Murmur heard. 3/6 systolic murmur RSB  Pulmonary/Chest: Effort normal and breath sounds normal. No respiratory distress. He has no wheezes.  Abdominal: Soft. Bowel sounds are normal. He exhibits no distension and no mass. There is no tenderness.  Musculoskeletal: Normal range of motion. He exhibits no edema.  Lymphadenopathy:    He has no cervical adenopathy.  Neurological: He is alert and oriented to person, place, and time. He has normal strength. No cranial nerve deficit or sensory deficit.  Skin: Skin is warm and dry.  Psychiatric: He has a normal mood and affect.     Diagnostic Tests:  Result status: Final result                      *[facility] - [place], [place] [address]                            [phone]  ------------------------------------------------------------------- Transthoracic Echocardiography  Patient:    Claron, Rosencrans MR #:       [id number] Study Date: 04/03/2017 Gender:     M Age:        50 Height:     175.3  cm Weight:     76.2 kg BSA:        1.93 m^2 Pt. Status: Room:   ATTENDING    Kerry Hough, M.D.  Berna Spare, M.D.  REFERRING    Kerry Hough, M.D.  PERFORMING   [facility], [facility]  SONOGRAPHER  Alvino Chapel, RCS  cc:  ------------------------------------------------------------------- LV EF: 35% -   40%  ------------------------------------------------------------------- Indications:      Aortic stenosis 424.1.  ------------------------------------------------------------------- History:   PMH:  Acquired from the patient and from the patient&'s chart.  PMH:  History of probable bicuspid AV by prior echo- echo 01/2010 LVEF 60-65%, bicuspid AV mod AS mean grad 34 and AVA VTI 1.12.  Risk factors:  Hypertension.  ------------------------------------------------------------------- Study Conclusions  - Left ventricle: The cavity size was normal. Wall thickness was   increased in a pattern of moderate LVH. Systolic function was   moderately reduced. The estimated ejection fraction was in the   range of 35% to 40%. Diffuse hypokinesis. Doppler parameters are   consistent with abnormal left ventricular relaxation (grade 1   diastolic dysfunction). Doppler parameters are consistent with   high ventricular filling pressure. - Aortic valve: Severely calcified annulus. Severely thickened   leaflets. There was severe stenosis. Mean gradient (S): 51 mm Hg.   VTI ratio of LVOT to aortic valve: 0.11. Valve area (VTI): 0.39   cm^2. Valve area (Vmax): 0.4 cm^2. Valve area (Vmean): 0.43 cm^2. - Left atrium: The atrium was mildly dilated. - Technically adequate study.  ------------------------------------------------------------------- Study data:  No prior study was available for comparison.  Study status:  Routine.  Procedure:  The patient reported no pain pre or post test. Transthoracic echocardiography. Image quality was adequate.  Study completion:  There  were no complications. Transthoracic echocardiography.  M-mode, complete 2D, spectral Doppler, and color Doppler.  Birthdate:  Patient birthdate: 10-29-1950.  Age:  Patient is 66 yr old.  Sex:  Gender: male. BMI: 24.8 kg/m^2.  Blood pressure:     124/84  Patient status: Inpatient.  Study date:  Study date: 04/03/2017. Study time: 09:16 AM.  Location:  Echo laboratory.  -------------------------------------------------------------------  ------------------------------------------------------------------- Left ventricle:  The cavity size was normal. Wall thickness was increased in a pattern of moderate LVH. Systolic function was moderately reduced. The estimated ejection fraction was in the range of 35% to 40%. Diffuse hypokinesis. Doppler parameters are consistent with abnormal left ventricular relaxation (grade 1 diastolic dysfunction). Doppler parameters are consistent with high ventricular filling pressure.  ------------------------------------------------------------------- Aortic valve:   Severely calcified annulus. Severely thickened leaflets. Uncertain number of leaflets. Heavy calcification limits visualization.  Doppler:   There was severe stenosis.   There was no significant regurgitation.    VTI ratio of LVOT to aortic valve: 0.11. Valve area (VTI): 0.39 cm^2. Indexed valve area (VTI): 0.2 cm^2/m^2. Peak velocity ratio of LVOT to aortic valve: 0.12. Valve area (Vmax): 0.4 cm^2. Indexed valve area (Vmax): 0.21 cm^2/m^2. Mean velocity ratio of LVOT to aortic valve: 0.12. Valve area (Vmean): 0.43 cm^2. Indexed valve area (Vmean): 0.22 cm^2/m^2. Mean gradient (S): 51 mm Hg. Peak gradient (S): 86 mm Hg.  ------------------------------------------------------------------- Aorta:  Aortic root: The aortic root was normal in size.  ------------------------------------------------------------------- Mitral valve:   Normal thickness leaflets .  Doppler:   There was no evidence  for stenosis.   There was no significant regurgitation.    Peak gradient (D): 2 mm Hg.  ------------------------------------------------------------------- Left atrium:  The atrium was mildly dilated.  ------------------------------------------------------------------- Atrial septum:  No defect or patent foramen ovale was identified.   ------------------------------------------------------------------- Right ventricle:  The cavity size was normal. Systolic function was low normal.  ------------------------------------------------------------------- Pulmonic valve:   Not well visualized.  Doppler:   There was no evidence for stenosis.   There was no significant regurgitation.   ------------------------------------------------------------------- Tricuspid  valve:   Normal thickness leaflets.  Doppler:   There was no evidence for stenosis.   There was mild regurgitation.  ------------------------------------------------------------------- Pulmonary artery:   Systolic pressure was within the normal range.   ------------------------------------------------------------------- Right atrium:  The atrium was normal in size.  ------------------------------------------------------------------- Pericardium:  There was no pericardial effusion.  ------------------------------------------------------------------- Systemic veins: Inferior vena cava: The vessel was normal in size. The respirophasic diameter changes were in the normal range (>= 50%), consistent with normal central venous pressure.  ------------------------------------------------------------------- Measurements   Left ventricle                            Value          Reference  LV ID, ED, PLAX chordal                   44.6  mm       43 - 52  LV ID, ES, PLAX chordal                   37.3  mm       23 - 38  LV fx shortening, PLAX chordal    (L)     16    %        >=29  LV PW thickness, ED                       13.6   mm       ---------  IVS/LV PW ratio, ED                       1.03           <=1.3  Stroke volume, 2D                         48    ml       ---------  Stroke volume/bsa, 2D                     25    ml/m^2   ---------  LV ejection fraction, 1-p A4C             35    %        ---------  LV end-diastolic volume, 2-p              142   ml       ---------  LV end-systolic volume, 2-p               88    ml       ---------  LV ejection fraction, 2-p                 38    %        ---------  Stroke volume, 2-p                        54    ml       ---------  LV end-diastolic volume/bsa, 2-p          73    ml/m^2   ---------  LV end-systolic volume/bsa, 2-p           46    ml/m^2   ---------  Stroke volume/bsa, 2-p  27.9  ml/m^2   ---------  LV e&', lateral                            4.24  cm/s     ---------  LV E/e&', lateral                          16.89          ---------  LV e&', medial                             4.46  cm/s     ---------  LV E/e&', medial                           16.05          ---------  LV e&', average                            4.35  cm/s     ---------  LV E/e&', average                          16.46          ---------    Ventricular septum                        Value          Reference  IVS thickness, ED                         14    mm       ---------    LVOT                                      Value          Reference  LVOT ID, S                                21    mm       ---------  LVOT area                                 3.46  cm^2     ---------  LVOT peak velocity, S                     54.3  cm/s     ---------  LVOT mean velocity, S                     41.7  cm/s     ---------  LVOT VTI, S                               13.8  cm       ---------  LVOT peak gradient, S  1     mm Hg    ---------    Aortic valve                              Value          Reference  Aortic valve peak velocity, S             465   cm/s      ---------  Aortic valve mean velocity, S             338   cm/s     ---------  Aortic valve VTI, S                       123   cm       ---------  Aortic mean gradient, S                   51    mm Hg    ---------  Aortic peak gradient, S                   86    mm Hg    ---------  VTI ratio, LVOT/AV                        0.11           ---------  Aortic valve area, VTI                    0.39  cm^2     ---------  Aortic valve area/bsa, VTI                0.2   cm^2/m^2 ---------  Velocity ratio, peak, LVOT/AV             0.12           ---------  Aortic valve area, peak velocity          0.4   cm^2     ---------  Aortic valve area/bsa, peak               0.21  cm^2/m^2 ---------  velocity  Velocity ratio, mean, LVOT/AV             0.12           ---------  Aortic valve area, mean velocity          0.43  cm^2     ---------  Aortic valve area/bsa, mean               0.22  cm^2/m^2 ---------  velocity    Aorta                                     Value          Reference  Aortic root ID, ED                        33    mm       ---------    Left atrium                               Value  Reference  LA ID, A-P, ES                            41    mm       ---------  LA volume/bsa, S                          33.8  ml/m^2   ---------    Mitral valve                              Value          Reference  Mitral E-wave peak velocity               71.6  cm/s     ---------  Mitral A-wave peak velocity               102   cm/s     ---------  Mitral deceleration time                  225   ms       150 - 230  Mitral peak gradient, D                   2     mm Hg    ---------  Mitral E/A ratio, peak                    0.7            ---------    Pulmonary arteries                        Value          Reference  PA pressure, S, DP                        27    mm Hg    <=30    Right ventricle                           Value          Reference  TAPSE                                      16    mm       ---------  Legend: (L)  and  (H)  mark values outside specified reference range.  ------------------------------------------------------------------- Prepared and Electronically Authenticated by  Kerry Hough, M.D. 2018-11-19T11:56:20    Physicians   Panel Physicians Referring Physician Case Authorizing Physician  Burnell Blanks, MD (Primary)    Procedures   RIGHT/LEFT HEART CATH AND CORONARY ANGIOGRAPHY  Conclusion     Prox RCA lesion is 10% stenosed.  Ost 2nd Mrg to 2nd Mrg lesion is 10% stenosed.  Prox LAD to Mid LAD lesion is 20% stenosed.   1. Mild non-obstructive CAD 2. Severe aortic stenosis by echo (unable to cross the aortic valve from the radial approach).   Recommendations: He will need referral to CT surgery to discuss surgical AVR. He is low risk for surgery and will not be a candidate for TAVR if his surgical risk is  felt to be low.    Indications   Severe aortic stenosis [I35.0 (ICD-10-CM)]  Procedural Details/Technique   Technical Details Indication: 66 yo male with history of non-ischemic cardiomyopathy, severe AS, former tobacco abuse here today for cardiac cath as part of workup for his severe AS, probable surgical AVR.   Procedure: The risks, benefits, complications, treatment options, and expected outcomes were discussed with the patient. The patient and/or family concurred with the proposed plan, giving informed consent. The patient was brought to the cath lab after IV hydration was given. The patient was further sedated with Versed and Fentanyl. There was an IV catheter present in the right antecubital vein. This area was prepped and draped. I then changed it out for a 5 [place] sheath. Right heart cath performed with a balloon tipped catheter. The right wrist was prepped and draped in a sterile fashion. 1% lidocaine was used for local anesthesia. Using the modified Seldinger access technique, a 5 French sheath was  placed in the right radial artery. 3 mg Verapamil was given through the sheath. 4000 units IV heparin was given. Standard diagnostic catheters were used to perform selective coronary angiography. I was unable to cross the aortic valve despite attempts with multiple catheters and a straight wire. The sheath was removed from the right radial artery and a Terumo hemostasis band was applied at the arteriotomy site on the right wrist.     Estimated blood loss <50 mL.  During this procedure the patient was administered the following to achieve and maintain moderate conscious sedation: Versed 3 mg, Fentanyl 75 mcg, while the patient's heart rate, blood pressure, and oxygen saturation were continuously monitored. The period of conscious sedation was 43 minutes, of which I was present face-to-face 100% of this time.  Complications   Complications documented before study signed (04/12/2017 11:35 AM EST)    RIGHT/LEFT HEART CATH AND CORONARY ANGIOGRAPHY   None Documented by Burnell Blanks, MD 04/12/2017 11:27 AM EST  Time Range: Intra-procedure      Coronary Findings   Diagnostic  Dominance: Right  Left Anterior Descending  Vessel is large.  Prox LAD to Mid LAD lesion 20% stenosed  Prox LAD to Mid LAD lesion is 20% stenosed.  First Diagonal Branch  Vessel is moderate in size.  Second Diagonal Branch  Vessel is moderate in size.  Left Circumflex  First Obtuse Marginal Branch  Vessel is small in size.  Second Obtuse Marginal Branch  Vessel is moderate in size.  Ost 2nd Mrg to 2nd Mrg lesion 10% stenosed  Ost 2nd Mrg to 2nd Mrg lesion is 10% stenosed.  Third Obtuse Marginal Branch  Vessel is small in size.  Right Coronary Artery  Vessel is large.  Prox RCA lesion 10% stenosed  Prox RCA lesion is 10% stenosed.  Intervention   No interventions have been documented.  Coronary Diagrams   Diagnostic Diagram       Implants     No implant documentation for this case.  MERGE  Images   Show images for CARDIAC CATHETERIZATION   Link to Procedure Log   Procedure Log    Hemo Data    Most Recent Value  RA A Wave 6 mmHg  RA V Wave 5 mmHg  RA Mean 4 mmHg  RV Systolic Pressure 29 mmHg  RV Diastolic Pressure 3 mmHg  RV EDP 6 mmHg  PA Systolic Pressure 29 mmHg  PA Diastolic Pressure 0 mmHg  PA Mean 22 mmHg  PW A Wave  13 mmHg  PW V Wave 11 mmHg  PW Mean 9 mmHg  AO Systolic Pressure 87 mmHg  AO Diastolic Pressure 51 mmHg  AO Mean 67 mmHg    Impression:  This 66 year old gentleman has stage D, severe, symptomatic aortic stenosis with New York Heart Association class II symptoms of exertional shortness of breath and fatigue as well as chest pressure consistent with chronic diastolic heart failure.  He has moderate left ventricular systolic dysfunction with ejection fraction of 35-40% compared to 60-65% in 2011.  This is probably the end result of untreated severe aortic stenosis.  I have personally reviewed his echocardiogram and cardiac catheterization.  His echocardiogram shows severe calcification of the aortic annulus and leaflets with markedly restricted mobility and severe aortic stenosis.  He has moderate left ventricular hypertrophy.  His aorta appears normal size.  He had a CTA of the chest on 04/03/2017 which showed no aortic aneurysm.  His cardiac catheterization shows minimal nonobstructive coronary disease.  I agree that aortic valve replacement is indicated in this patient to prevent progressive left ventricular deterioration and to improve his symptoms.  He is in the low risk group for open surgical aortic valve replacement and I do not think TAVR is an option for him.  I discussed the options of bioprosthetic and mechanical valves.  We reviewed the pros and cons of both.  Given his age of 78 years I think a bioprosthetic valve would work well for him and avoid the need for long-term anticoagulation with Coumadin.  He is in agreement with that.   I  discussed the operative procedure with the patient and his wife including alternatives, benefits and risks; including but not limited to bleeding, blood transfusion, infection, stroke, myocardial infarction, graft failure, heart block requiring a permanent pacemaker, organ dysfunction, and death.  [name] understands and agrees to proceed.  He would like to wait until after the holidays to have his surgery.  Plan:  Aortic valve replacement using a bioprosthetic valve on Thursday, May 18, 2017.   I spent 60 minutes performing this consultation and > 50% of this time was spent face to face counseling and coordinating the care of this patient's severe aortic stenosis.   Gaye Pollack, MD [facility] [phone]

## 2017-05-12 ENCOUNTER — Encounter (HOSPITAL_COMMUNITY): Payer: Self-pay

## 2017-05-12 NOTE — Pre-Procedure Instructions (Signed)
Jeremy Larson  05/12/2017      [facility] - [address], [place], [place] - [address] [place] [address] Phone: [phone] Fax: [phone]    Your procedure is scheduled on 05-18-2017  Thursday .  Report to [facility] Admitting at 5:30A.M.   Call this number if you have problems the morning of surgery:  [phone]   Remember:  Do not eat food or drink liquids after midnight.   Take these medicines the morning of surgery with A SIP OF WATER    Loratadine(Clairitin)   STOP ASPIRIN,ANTIINFLAMATORIES (IBUPROFEN,ALEVE,MOTRIN,ADVIL,GOODY'S POWDERS),HERBAL SUPPLEMENTS,FISH OIL,AND VITAMINS 5-7 DAYS PRIOR TO SURGERY    Do not wear jewelry, make-up or nail polish.  Do not wear lotions, powders, or perfumes, or deodorant.  Do not shave 48 hours prior to surgery.  Men may shave face and neck.  Do not bring valuables to the hospital.  [facility] is not responsible for any belongings or valuables.  Contacts, dentures or bridgework may not be worn into surgery.  Leave your suitcase in the car.  After surgery it may be brought to your room.  For patients admitted to the hospital, discharge time will be determined by your treatment team.  Patients discharged the day of surgery will not be allowed to drive home.     Special Instructions: [place] - Preparing for Surgery  Before surgery, you can play an important role.  Because skin is not sterile, your skin needs to be as free of germs as possible.  You can reduce the number of germs on you skin by washing with CHG (chlorahexidine gluconate) soap before surgery.  CHG is an antiseptic cleaner which kills germs and bonds with the skin to continue killing germs even after washing.  Please DO NOT use if you have an allergy to CHG or antibacterial soaps.  If your skin becomes reddened/irritated stop using the CHG and inform your nurse when you arrive at Short Stay.  Do not shave (including  legs and underarms) for at least 48 hours prior to the first CHG shower.  You may shave your face.  Please follow these instructions carefully:   1.  Shower with CHG Soap the night before surgery and the   morning of Surgery.  2.  If you choose to wash your hair, wash your hair first as usual with your normal shampoo.  3.  After you shampoo, rinse your hair and body thoroughly to remove the  Shampoo.  4.  Use CHG as you would any other liquid soap.  You can apply chg directly  to the skin and wash gently with scrungie or a clean washcloth.  5.  Apply the CHG Soap to your body ONLY FROM THE NECK DOWN.   Do not use on open wounds or open sores.  Avoid contact with your eyes,  ears, mouth and genitals (private parts).  Wash genitals (private parts) with your normal soap.  6.  Wash thoroughly, paying special attention to the area where your surgery will be performed.  7.  Thoroughly rinse your body with warm water from the neck down.  8.  DO NOT shower/wash with your normal soap after using and rinsing o  the CHG Soap.  9.  Pat yourself dry with a clean towel.            10.  Wear clean pajamas.            11.  Place clean sheets on your bed the night of your first shower and do not sleep with pets.  Day of Surgery  Do not apply any lotions/deodorants the morning of surgery.  Please wear clean clothes to the hospital/surgery center.   Please read over the following fact sheets that you were given. Pain Booklet, Coughing and Deep Breathing, MRSA Information and Surgical Site Infection Prevention

## 2017-05-15 ENCOUNTER — Encounter (HOSPITAL_COMMUNITY)
Admission: RE | Admit: 2017-05-15 | Discharge: 2017-05-15 | Disposition: A | Discharge status: Home / Self Care | Payer: Medicare Other | Source: Ambulatory Visit | Attending: Surgery | Admitting: Surgery

## 2017-05-15 ENCOUNTER — Other Ambulatory Visit (HOSPITAL_COMMUNITY): Payer: Self-pay | Admitting: *Deleted

## 2017-05-15 ENCOUNTER — Other Ambulatory Visit: Payer: Self-pay | Admitting: *Deleted

## 2017-05-15 ENCOUNTER — Encounter (HOSPITAL_COMMUNITY): Payer: Self-pay

## 2017-05-15 ENCOUNTER — Ambulatory Visit (HOSPITAL_BASED_OUTPATIENT_CLINIC_OR_DEPARTMENT_OTHER)
Admission: RE | Admit: 2017-05-15 | Discharge: 2017-05-15 | Disposition: A | Discharge status: Home / Self Care | Payer: Medicare Other | Source: Ambulatory Visit | Attending: Surgery | Admitting: Surgery

## 2017-05-15 ENCOUNTER — Other Ambulatory Visit: Payer: Self-pay

## 2017-05-15 ENCOUNTER — Ambulatory Visit (HOSPITAL_COMMUNITY)
Admission: RE | Admit: 2017-05-15 | Discharge: 2017-05-15 | Disposition: A | Discharge status: Home / Self Care | Payer: Medicare Other | Source: Ambulatory Visit | Attending: Surgery | Admitting: Surgery

## 2017-05-15 DIAGNOSIS — Z79899 Other long term (current) drug therapy: Secondary | ICD-10-CM | POA: Diagnosis not present

## 2017-05-15 DIAGNOSIS — Z8249 Family history of ischemic heart disease and other diseases of the circulatory system: Secondary | ICD-10-CM | POA: Diagnosis not present

## 2017-05-15 DIAGNOSIS — I350 Nonrheumatic aortic (valve) stenosis: Secondary | ICD-10-CM

## 2017-05-15 DIAGNOSIS — I700 Atherosclerosis of aorta: Secondary | ICD-10-CM | POA: Insufficient documentation

## 2017-05-15 DIAGNOSIS — Z7982 Long term (current) use of aspirin: Secondary | ICD-10-CM | POA: Diagnosis not present

## 2017-05-15 DIAGNOSIS — R7303 Prediabetes: Secondary | ICD-10-CM | POA: Diagnosis not present

## 2017-05-15 DIAGNOSIS — E8770 Fluid overload, unspecified: Secondary | ICD-10-CM | POA: Diagnosis not present

## 2017-05-15 DIAGNOSIS — I440 Atrioventricular block, first degree: Secondary | ICD-10-CM | POA: Diagnosis not present

## 2017-05-15 DIAGNOSIS — I5032 Chronic diastolic (congestive) heart failure: Secondary | ICD-10-CM | POA: Diagnosis not present

## 2017-05-15 DIAGNOSIS — I97190 Other postprocedural cardiac functional disturbances following cardiac surgery: Secondary | ICD-10-CM | POA: Diagnosis not present

## 2017-05-15 DIAGNOSIS — I10 Essential (primary) hypertension: Secondary | ICD-10-CM

## 2017-05-15 DIAGNOSIS — Z87891 Personal history of nicotine dependence: Secondary | ICD-10-CM | POA: Diagnosis not present

## 2017-05-15 DIAGNOSIS — D62 Acute posthemorrhagic anemia: Secondary | ICD-10-CM | POA: Diagnosis not present

## 2017-05-15 DIAGNOSIS — I2510 Atherosclerotic heart disease of native coronary artery without angina pectoris: Secondary | ICD-10-CM | POA: Diagnosis not present

## 2017-05-15 DIAGNOSIS — I4891 Unspecified atrial fibrillation: Secondary | ICD-10-CM | POA: Diagnosis not present

## 2017-05-15 DIAGNOSIS — I428 Other cardiomyopathies: Secondary | ICD-10-CM | POA: Diagnosis not present

## 2017-05-15 DIAGNOSIS — I110 Hypertensive heart disease with heart failure: Secondary | ICD-10-CM | POA: Diagnosis not present

## 2017-05-15 DIAGNOSIS — Z8379 Family history of other diseases of the digestive system: Secondary | ICD-10-CM | POA: Diagnosis not present

## 2017-05-15 HISTORY — DX: Essential (primary) hypertension: I10

## 2017-05-15 HISTORY — DX: Dyspnea, unspecified: R06.00

## 2017-05-15 LAB — PULMONARY FUNCTION TEST
DL/VA % pred: 104 %
DL/VA: 4.77 ml/min/mmHg/L
DLCO UNC % PRED: 66 %
DLCO UNC: 20.51 ml/min/mmHg
FEF 25-75 PRE: 2.45 L/s
FEF 25-75 Post: 1.84 L/sec
FEF2575-%CHANGE-POST: -24 %
FEF2575-%PRED-POST: 69 %
FEF2575-%PRED-PRE: 93 %
FEV1-%Change-Post: -6 %
FEV1-%PRED-POST: 77 %
FEV1-%Pred-Pre: 82 %
FEV1-Post: 2.56 L
FEV1-Pre: 2.74 L
FEV1FVC-%CHANGE-POST: -4 %
FEV1FVC-%Pred-Pre: 102 %
FEV6-%CHANGE-POST: -6 %
FEV6-%Pred-Post: 79 %
FEV6-%Pred-Pre: 84 %
FEV6-Post: 3.34 L
FEV6-Pre: 3.55 L
FEV6FVC-%Change-Post: 0 %
FEV6FVC-%Pred-Post: 105 %
FEV6FVC-%Pred-Pre: 105 %
FVC-%CHANGE-POST: -2 %
FVC-%Pred-Post: 78 %
FVC-%Pred-Pre: 80 %
FVC-Post: 3.47 L
FVC-Pre: 3.56 L
POST FEV1/FVC RATIO: 74 %
PRE FEV1/FVC RATIO: 77 %
Post FEV6/FVC ratio: 100 %
Pre FEV6/FVC Ratio: 100 %
RV % pred: 117 %
RV: 2.69 L
TLC % pred: 88 %
TLC: 6.06 L

## 2017-05-15 LAB — CBC
HEMATOCRIT: 42.9 % (ref 39.0–52.0)
HEMOGLOBIN: 14.7 g/dL (ref 13.0–17.0)
MCH: 31.7 pg (ref 26.0–34.0)
MCHC: 34.3 g/dL (ref 30.0–36.0)
MCV: 92.7 fL (ref 78.0–100.0)
Platelets: 182 10*3/uL (ref 150–400)
RBC: 4.63 MIL/uL (ref 4.22–5.81)
RDW: 13 % (ref 11.5–15.5)
WBC: 6.2 10*3/uL (ref 4.0–10.5)

## 2017-05-15 LAB — HEMOGLOBIN A1C
Hgb A1c MFr Bld: 5.7 % — ABNORMAL HIGH (ref 4.8–5.6)
Mean Plasma Glucose: 116.89 mg/dL

## 2017-05-15 LAB — SURGICAL PCR SCREEN
MRSA, PCR: NEGATIVE
Staphylococcus aureus: NEGATIVE

## 2017-05-15 LAB — URINALYSIS, ROUTINE W REFLEX MICROSCOPIC
Bilirubin Urine: NEGATIVE
GLUCOSE, UA: NEGATIVE mg/dL
Hgb urine dipstick: NEGATIVE
KETONES UR: NEGATIVE mg/dL
LEUKOCYTES UA: NEGATIVE
NITRITE: NEGATIVE
Protein, ur: NEGATIVE mg/dL
Specific Gravity, Urine: 1.005 (ref 1.005–1.030)
pH: 6 (ref 5.0–8.0)

## 2017-05-15 LAB — BLOOD GAS, ARTERIAL
Acid-Base Excess: 0.8 mmol/L (ref 0.0–2.0)
Bicarbonate: 25.1 mmol/L (ref 20.0–28.0)
DRAWN BY: 42180
FIO2: 21
O2 Saturation: 97.2 %
PATIENT TEMPERATURE: 98.6
PH ART: 7.396 (ref 7.350–7.450)
PO2 ART: 94.1 mmHg (ref 83.0–108.0)
pCO2 arterial: 41.7 mmHg (ref 32.0–48.0)

## 2017-05-15 LAB — COMPREHENSIVE METABOLIC PANEL
ALBUMIN: 3.9 g/dL (ref 3.5–5.0)
ALK PHOS: 68 U/L (ref 38–126)
ALT: 16 U/L — ABNORMAL LOW (ref 17–63)
AST: 19 U/L (ref 15–41)
Anion gap: 8 (ref 5–15)
BILIRUBIN TOTAL: 0.8 mg/dL (ref 0.3–1.2)
BUN: 10 mg/dL (ref 6–20)
CALCIUM: 9.4 mg/dL (ref 8.9–10.3)
CO2: 21 mmol/L — ABNORMAL LOW (ref 22–32)
CREATININE: 1.13 mg/dL (ref 0.61–1.24)
Chloride: 106 mmol/L (ref 101–111)
GFR calc Af Amer: >60 mL/min (ref >60)
GFR calc non Af Amer: >60 mL/min (ref >60)
GLUCOSE: 101 mg/dL — AB (ref 65–99)
Potassium: 4.4 mmol/L (ref 3.5–5.1)
Sodium: 135 mmol/L (ref 135–145)
TOTAL PROTEIN: 6.5 g/dL (ref 6.5–8.1)

## 2017-05-15 LAB — PROTIME-INR
INR: 0.91
Prothrombin Time: 12.2 seconds (ref 11.4–15.2)

## 2017-05-15 LAB — TYPE AND SCREEN
ABO/RH(D): O NEG
Antibody Screen: NEGATIVE

## 2017-05-15 LAB — ABO/RH: ABO/RH(D): O NEG

## 2017-05-15 LAB — APTT: aPTT: 30 seconds (ref 24–36)

## 2017-05-15 MED ORDER — ALBUTEROL SULFATE (2.5 MG/3ML) 0.083% IN NEBU
2.5000 mg | INHALATION_SOLUTION | Freq: Once | RESPIRATORY_TRACT | Status: AC
  Administered 2017-05-15: 2.5 mg via RESPIRATORY_TRACT

## 2017-05-15 NOTE — Progress Notes (Signed)
Cardiologist    Dr. Harl Bowie

## 2017-05-15 NOTE — Progress Notes (Signed)
Pre-op Cardiac Surgery  Carotid Findings:  Bilateral 1-39% ICA stenosis, antegrade vertebral flow.   Upper Extremity Right Left  Brachial Pressures 97, Tri 99, Tri  Radial Waveforms Tri Tri  Ulnar Waveforms Tri Tri  Palmar Arch (Allen's Test) waveform increases slightly  with radial compression and reverses with ulnar compression waveform increases with radial compression and obliterates with ulnar compression    Lita Mains- RDMS, RVT 1:44 PM  05/15/2017

## 2017-05-17 ENCOUNTER — Encounter (HOSPITAL_COMMUNITY): Payer: Self-pay | Admitting: Certified Registered Nurse Anesthetist

## 2017-05-17 MED ORDER — TRANEXAMIC ACID (OHS) PUMP PRIME SOLUTION
2.0000 mg/kg | INTRAVENOUS | Status: DC
Start: 2017-05-18 — End: 2017-05-18
  Filled 2017-05-17: qty 1.52

## 2017-05-17 MED ORDER — NITROGLYCERIN IN D5W 200-5 MCG/ML-% IV SOLN
2.0000 ug/min | INTRAVENOUS | Status: AC
  Administered 2017-05-18: 20 ug/min via INTRAVENOUS
  Filled 2017-05-17: qty 250

## 2017-05-17 MED ORDER — TRANEXAMIC ACID 1000 MG/10ML IV SOLN
1.5000 mg/kg/h | INTRAVENOUS | Status: AC
  Administered 2017-05-18: 1.5 mg/kg/h via INTRAVENOUS
  Filled 2017-05-17: qty 25

## 2017-05-17 MED ORDER — SODIUM CHLORIDE 0.9 % IV SOLN
1250.0000 mg | INTRAVENOUS | Status: AC
  Administered 2017-05-18: 1250 mg via INTRAVENOUS
  Filled 2017-05-17: qty 1250

## 2017-05-17 MED ORDER — SODIUM CHLORIDE 0.9 % IV SOLN
30.0000 ug/min | INTRAVENOUS | Status: AC
  Administered 2017-05-18: 15 ug/min via INTRAVENOUS
  Filled 2017-05-17: qty 2

## 2017-05-17 MED ORDER — SODIUM CHLORIDE 0.9 % IV SOLN
INTRAVENOUS | Status: AC
  Administered 2017-05-18: 1.2 [IU]/h via INTRAVENOUS
  Filled 2017-05-17: qty 1

## 2017-05-17 MED ORDER — TRANEXAMIC ACID (OHS) BOLUS VIA INFUSION
15.0000 mg/kg | INTRAVENOUS | Status: AC
  Administered 2017-05-18: 1141.5 mg via INTRAVENOUS
  Filled 2017-05-17: qty 1142

## 2017-05-17 MED ORDER — DEXMEDETOMIDINE HCL IN NACL 400 MCG/100ML IV SOLN
0.1000 ug/kg/h | INTRAVENOUS | Status: AC
  Administered 2017-05-18: .5 ug/kg/h via INTRAVENOUS
  Filled 2017-05-17: qty 100

## 2017-05-17 MED ORDER — HEPARIN SODIUM (PORCINE) 1000 UNIT/ML IJ SOLN
INTRAMUSCULAR | Status: DC
  Filled 2017-05-17: qty 30

## 2017-05-17 MED ORDER — POTASSIUM CHLORIDE 2 MEQ/ML IV SOLN
80.0000 meq | INTRAVENOUS | Status: DC
  Filled 2017-05-17: qty 40

## 2017-05-17 MED ORDER — MAGNESIUM SULFATE 50 % IJ SOLN
40.0000 meq | INTRAMUSCULAR | Status: DC
  Filled 2017-05-17: qty 9.85

## 2017-05-17 MED ORDER — CHLORHEXIDINE GLUCONATE 0.12 % MT SOLN
15.0000 mL | Freq: Once | OROMUCOSAL | Status: AC
  Administered 2017-05-18: 15 mL via OROMUCOSAL
  Filled 2017-05-17: qty 15

## 2017-05-17 MED ORDER — CEFUROXIME SODIUM 750 MG IJ SOLR
750.0000 mg | INTRAMUSCULAR | Status: DC
  Filled 2017-05-17: qty 750

## 2017-05-17 MED ORDER — METOPROLOL TARTRATE 12.5 MG HALF TABLET
12.5000 mg | ORAL_TABLET | Freq: Once | ORAL | Status: DC
  Filled 2017-05-17: qty 1

## 2017-05-17 MED ORDER — MILRINONE LACTATE IN DEXTROSE 20-5 MG/100ML-% IV SOLN
0.1250 ug/kg/min | INTRAVENOUS | Status: DC
  Filled 2017-05-17: qty 100

## 2017-05-17 MED ORDER — DEXTROSE 5 % IV SOLN
1.5000 g | INTRAVENOUS | Status: AC
  Administered 2017-05-18: 1.5 g via INTRAVENOUS
  Filled 2017-05-17 (×2): qty 1.5

## 2017-05-17 MED ORDER — PLASMA-LYTE 148 IV SOLN
INTRAVENOUS | Status: DC
  Filled 2017-05-17: qty 2.5

## 2017-05-17 MED ORDER — EPINEPHRINE PF 1 MG/ML IJ SOLN
0.0000 ug/min | INTRAVENOUS | Status: DC
  Filled 2017-05-17: qty 4

## 2017-05-17 MED ORDER — DOPAMINE-DEXTROSE 3.2-5 MG/ML-% IV SOLN
0.0000 ug/kg/min | INTRAVENOUS | Status: DC
  Filled 2017-05-17: qty 250

## 2017-05-17 NOTE — Anesthesia Preprocedure Evaluation (Addendum)
Anesthesia Evaluation  Patient identified by MRN, date of birth, ID band Patient awake    Reviewed: Allergy & Precautions, NPO status , Patient's Chart, lab work & pertinent test results, reviewed documented beta blocker date and time   History of Anesthesia Complications Negative for: history of anesthetic complications  Airway Mallampati: II  TM Distance: >3 FB Neck ROM: Full    Dental  (+) Edentulous Upper, Edentulous Lower   Pulmonary COPD, former smoker,    breath sounds clear to auscultation       Cardiovascular hypertension, Pt. on medications and Pt. on home beta blockers (-) angina(-) CAD + Valvular Problems/Murmurs (severe) AS  Rhythm:Regular Rate:Normal  11/18 ECHO: EF 35-40%, diffuse hypokinesis, bicuspid aortic valve with severe AS, peak gradient 86 mmHg, mean 53 mmHg, AVA 0.39 cm2(VTI)   Neuro/Psych Recent syncope    GI/Hepatic negative GI ROS, Neg liver ROS,   Endo/Other  negative endocrine ROS  Renal/GU negative Renal ROS     Musculoskeletal   Abdominal   Peds  Hematology negative hematology ROS (+)   Anesthesia Other Findings   Reproductive/Obstetrics                            Anesthesia Physical Anesthesia Plan  ASA: III  Anesthesia Plan: General   Post-op Pain Management:    Induction: Intravenous  PONV Risk Score and Plan: 2 and Treatment may vary due to age or medical condition  Airway Management Planned: Oral ETT  Additional Equipment: Arterial line, PA Cath, 3D TEE and Ultrasound Guidance Line Placement  Intra-op Plan:   Post-operative Plan: Post-operative intubation/ventilation  Informed Consent: I have reviewed the patients History and Physical, chart, labs and discussed the procedure including the risks, benefits and alternatives for the proposed anesthesia with the patient or authorized representative who has indicated his/her understanding and  acceptance.     Plan Discussed with: CRNA and Surgeon  Anesthesia Plan Comments: (Plan routine monitors, A line, PA cath, GETA with TEE and post op ventilation)        Anesthesia Quick Evaluation

## 2017-05-17 NOTE — H&P (Signed)
[address]       [place], [address]             [phone]      Cardiothoracic Surgery Admission History and Physical   PCP is Caryl Bis, MD  Referring Provider is Burnell Blanks*      Chief Complaint  Patient presents with Aortic stenosis          HPI:  The patient is a 67 year old gentleman with a long history of a heart murmur and known bicuspid aortic valve found on echocardiogram in 2011. He was seen by Dr. Percival Spanish at that time and an echocardiogram in 01/2010 showed an ejection fraction of 60-65% with a mean gradient across aortic valve of 34 mmHg and a valve area of 1.12 cm. He denies any symptoms at that time and subsequently underwent an exercise tolerance test which was unremarkable. A decision was made to repeat his echocardiogram in 6 months but he did not return for follow-up. In October 2018 he was referred to Dr. Harl Bowie by his PCP, Dr. Quillian Quince, for further evaluation. He reports that he has been having shortness of breath with moderate exertion such as walking less than one half block or up one flight of stairs. He has had some associated chest tightness. He denies any dizziness or syncope. He has had fatigue. He underwent repeat echocardiogram on 04/03/2017 which showed severe aortic stenosis with a mean gradient of 51 mmHg and a peak gradient of 86 mmHg. The dimensionless index was 0.12. There was severe aortic annular calcification and severe thickening and calcification of the leaflets. The ejection fraction was moderately reduced to 35-40% with diffuse hypokinesis and grade 1 diastolic dysfunction. His echo in 2011 had shown normal left ventricular ejection fraction of 60-65%. Cardiac catheterization was performed by Dr. Angelena Form on 04/12/2017 and this showed mild nonobstructive coronary disease.  The patient is here today with his wife. 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 He remains quite active  although he has recently had to scale back his activity to avoid symptoms. He is 1 of 7 brothers and his family and does not know if any of them have aortic valve disease. He does have a granddaughter who has bicuspid aortic valve.      Past Medical History:  Diagnosis Date  . Aortic stenosis         Past Surgical History:  Procedure Laterality Date  . HAND SURGERY     left hang following trauma  . RIGHT/LEFT HEART CATH AND CORONARY ANGIOGRAPHY N/A 04/12/2017   Procedure: RIGHT/LEFT HEART CATH AND CORONARY ANGIOGRAPHY; Surgeon: Burnell Blanks, MD; Location: [facility]; Service: Cardiovascular; Laterality: N/A;        Family History  Problem Relation Age of Onset  . Lung cancer Unknown    brother and father  . Heart murmur Unknown    mother  . Cancer Mother   . Heart failure Mother   . Cancer Father   . Cancer Brother   . Cirrhosis Brother   . Alcohol abuse Brother    Social History  Social History        Tobacco Use  . Smoking status: Former Smoker    Last attempt to quit: 1958    Years since quitting: 60.9  . Smokeless tobacco: Current User  Substance Use Topics  . Alcohol use: No  . Drug use: No  Current Outpatient Medications  Medication Sig Dispense Refill  . aspirin 81 MG tablet Take 81 mg by mouth daily.    Marland Kitchen lisinopril (PRINIVIL,ZESTRIL) 2.5 MG tablet Take 1 tablet (2.5 mg total) daily by mouth. 90 tablet 3  . loratadine (CLARITIN) 10 MG tablet Take 10 mg daily by mouth.    . metoprolol succinate (TOPROL XL) 25 MG 24 hr tablet Take 1.5 tablets (37.5 mg total) by mouth daily. 135 tablet 1   No current facility-administered medications for this visit.    No Known Allergies  Review of Systems  Constitutional: Positive for activity change and fatigue.  HENT: Negative.  Full dentures  Eyes: Negative.  Respiratory: Positive for shortness of breath.  Cardiovascular: Positive for chest pain. Negative for leg swelling.    Gastrointestinal: Negative.  Endocrine: Negative.  Genitourinary: Negative.  Musculoskeletal: Negative.  Allergic/Immunologic: Negative.  Neurological: Negative for dizziness and syncope.  Hematological: Negative.  Psychiatric/Behavioral: Negative.   BP 117/82 (BP Location: Right Arm, Patient Position: Sitting, Cuff Size: Large)  Pulse 77  Resp 20  Ht 5\' 9"  (1.753 m)  Wt 175 lb (79.4 kg)  SpO2 99% Comment: on RA  BMI 25.84 kg/m  Physical Exam  Constitutional: He is oriented to person, place, and time. He appears well-developed and well-nourished. No distress.  HENT:  Head: Normocephalic and atraumatic.  Mouth/Throat: Oropharynx is clear and moist.  Eyes: Conjunctivae and EOM are normal. Pupils are equal, round, and reactive to light.  Neck: Normal range of motion. Neck supple. No JVD present. No thyromegaly present.  Cardiovascular: Normal rate, regular rhythm and intact distal pulses.  Murmur heard. 3/6 systolic murmur RSB  Pulmonary/Chest: Effort normal and breath sounds normal. No respiratory distress. He has no wheezes.  Abdominal: Soft. Bowel sounds are normal. He exhibits no distension and no mass. There is no tenderness.  Musculoskeletal: Normal range of motion. He exhibits no edema.  Lymphadenopathy:  He has no cervical adenopathy.  Neurological: He is alert and oriented to person, place, and time. He has normal strength. No cranial nerve deficit or sensory deficit.  Skin: Skin is warm and dry.  Psychiatric: He has a normal mood and affect.   Diagnostic Tests:  Result status: Final result  *[facility] [place], [place] [address]  [phone]  -------------------------------------------------------------------  Transthoracic Echocardiography  Patient: Jeston, Junkins  MR #: [id number]  Study Date: 04/03/2017  Gender: M  Age: 20  Height: 175.3 cm  Weight: 76.2 kg  BSA: 1.93 m^2  Pt. Status:  Room:  ATTENDING Kerry Hough, M.D.  Berna Spare, M.D.  REFERRING Kerry Hough, M.D.  PERFORMING [facility], [facility]  SONOGRAPHER Alvino Chapel, RCS  cc:  -------------------------------------------------------------------  LV EF: 35% - 40%  -------------------------------------------------------------------  Indications: Aortic stenosis 424.1.  -------------------------------------------------------------------  History: PMH: Acquired from the patient and from the patient&'s  chart. PMH: History of probable bicuspid AV by prior echo- echo  01/2010 LVEF 60-65%, bicuspid AV mod AS mean grad 34 and AVA VTI  1.12. Risk factors: Hypertension.  -------------------------------------------------------------------  Study Conclusions  - Left ventricle: The cavity size was normal. Wall thickness was  increased in a pattern of moderate LVH. Systolic function was  moderately reduced. The estimated ejection fraction was in the  range of 35% to 40%. Diffuse hypokinesis. Doppler parameters are  consistent with abnormal left ventricular relaxation (grade 1  diastolic dysfunction). Doppler parameters are consistent with  high ventricular filling pressure.  -  Aortic valve: Severely calcified annulus. Severely thickened  leaflets. There was severe stenosis. Mean gradient (S): 51 mm Hg.  VTI ratio of LVOT to aortic valve: 0.11. Valve area (VTI): 0.39  cm^2. Valve area (Vmax): 0.4 cm^2. Valve area (Vmean): 0.43 cm^2.  - Left atrium: The atrium was mildly dilated.  - Technically adequate study.  -------------------------------------------------------------------  Study data: No prior study was available for comparison. Study  status: Routine. Procedure: The patient reported no pain pre or  post test. Transthoracic echocardiography. Image quality was  adequate. Study completion: There were no complications.  Transthoracic echocardiography. M-mode, complete 2D, spectral  Doppler, and color Doppler. Birthdate:  Patient birthdate:  17-Apr-1951. Age: Patient is 67 yr old. Sex: Gender: male.  BMI: 24.8 kg/m^2. Blood pressure: 124/84 Patient status:  Inpatient. Study date: Study date: 04/03/2017. Study time: 09:16  AM. Location: Echo laboratory.  -------------------------------------------------------------------  -------------------------------------------------------------------  Left ventricle: The cavity size was normal. Wall thickness was  increased in a pattern of moderate LVH. Systolic function was  moderately reduced. The estimated ejection fraction was in the  range of 35% to 40%. Diffuse hypokinesis. Doppler parameters are  consistent with abnormal left ventricular relaxation (grade 1  diastolic dysfunction). Doppler parameters are consistent with high  ventricular filling pressure.  -------------------------------------------------------------------  Aortic valve: Severely calcified annulus. Severely thickened  leaflets. Uncertain number of leaflets. Heavy calcification limits  visualization. Doppler: There was severe stenosis. There was  no significant regurgitation. VTI ratio of LVOT to aortic valve:  0.11. Valve area (VTI): 0.39 cm^2. Indexed valve area (VTI): 0.2  cm^2/m^2. Peak velocity ratio of LVOT to aortic valve: 0.12. Valve  area (Vmax): 0.4 cm^2. Indexed valve area (Vmax): 0.21 cm^2/m^2.  Mean velocity ratio of LVOT to aortic valve: 0.12. Valve area  (Vmean): 0.43 cm^2. Indexed valve area (Vmean): 0.22 cm^2/m^2.  Mean gradient (S): 51 mm Hg. Peak gradient (S): 86 mm Hg.  -------------------------------------------------------------------  Aorta: Aortic root: The aortic root was normal in size.  -------------------------------------------------------------------  Mitral valve: Normal thickness leaflets . Doppler: There was  no evidence for stenosis. There was no significant regurgitation.  Peak gradient (D): 2 mm Hg.   -------------------------------------------------------------------  Left atrium: The atrium was mildly dilated.  -------------------------------------------------------------------  Atrial septum: No defect or patent foramen ovale was identified.  -------------------------------------------------------------------  Right ventricle: The cavity size was normal. Systolic function was  low normal.  -------------------------------------------------------------------  Pulmonic valve: Not well visualized. Doppler: There was no  evidence for stenosis. There was no significant regurgitation.  -------------------------------------------------------------------  Tricuspid valve: Normal thickness leaflets. Doppler: There was  no evidence for stenosis. There was mild regurgitation.  -------------------------------------------------------------------  Pulmonary artery: Systolic pressure was within the normal range.  -------------------------------------------------------------------  Right atrium: The atrium was normal in size.  -------------------------------------------------------------------  Pericardium: There was no pericardial effusion.  -------------------------------------------------------------------  Systemic veins:  Inferior vena cava: The vessel was normal in size. The  respirophasic diameter changes were in the normal range (>= 50%),  consistent with normal central venous pressure.  -------------------------------------------------------------------  Measurements  Left ventricle Value Reference  LV ID, ED, PLAX chordal 44.6 mm 43 - 52  LV ID, ES, PLAX chordal 37.3 mm 23 - 38  LV fx shortening, PLAX chordal (L) 16 % >=29  LV PW thickness, ED 13.6 mm ---------  IVS/LV PW ratio, ED 1.03 <=1.3  Stroke volume, 2D 48 ml ---------  Stroke volume/bsa, 2D 25 ml/m^2 ---------  LV ejection fraction, 1-p A4C 35 % ---------  LV end-diastolic volume, 2-p  142 ml ---------  LV end-systolic  volume, 2-p 88 ml ---------  LV ejection fraction, 2-p 38 % ---------  Stroke volume, 2-p 54 ml ---------  LV end-diastolic volume/bsa, 2-p 73 ml/m^2 ---------  LV end-systolic volume/bsa, 2-p 46 ml/m^2 ---------  Stroke volume/bsa, 2-p 27.9 ml/m^2 ---------  LV e&', lateral 4.24 cm/s ---------  LV E/e&', lateral 16.89 ---------  LV e&', medial 4.46 cm/s ---------  LV E/e&', medial 16.05 ---------  LV e&', average 4.35 cm/s ---------  LV E/e&', average 16.46 ---------  Ventricular septum Value Reference  IVS thickness, ED 14 mm ---------  LVOT Value Reference  LVOT ID, S 21 mm ---------  LVOT area 3.46 cm^2 ---------  LVOT peak velocity, S 54.3 cm/s ---------  LVOT mean velocity, S 41.7 cm/s ---------  LVOT VTI, S 13.8 cm ---------  LVOT peak gradient, S 1 mm Hg ---------  Aortic valve Value Reference  Aortic valve peak velocity, S 465 cm/s ---------  Aortic valve mean velocity, S 338 cm/s ---------  Aortic valve VTI, S 123 cm ---------  Aortic mean gradient, S 51 mm Hg ---------  Aortic peak gradient, S 86 mm Hg ---------  VTI ratio, LVOT/AV 0.11 ---------  Aortic valve area, VTI 0.39 cm^2 ---------  Aortic valve area/bsa, VTI 0.2 cm^2/m^2 ---------  Velocity ratio, peak, LVOT/AV 0.12 ---------  Aortic valve area, peak velocity 0.4 cm^2 ---------  Aortic valve area/bsa, peak 0.21 cm^2/m^2 ---------  velocity  Velocity ratio, mean, LVOT/AV 0.12 ---------  Aortic valve area, mean velocity 0.43 cm^2 ---------  Aortic valve area/bsa, mean 0.22 cm^2/m^2 ---------  velocity  Aorta Value Reference  Aortic root ID, ED 33 mm ---------  Left atrium Value Reference  LA ID, A-P, ES 41 mm ---------  LA volume/bsa, S 33.8 ml/m^2 ---------  Mitral valve Value Reference  Mitral E-wave peak velocity 71.6 cm/s ---------  Mitral A-wave peak velocity 102 cm/s ---------  Mitral deceleration time 225 ms 150 - 230  Mitral peak gradient, D 2 mm Hg ---------  Mitral E/A ratio, peak 0.7  ---------  Pulmonary arteries Value Reference  PA pressure, S, DP 27 mm Hg <=30  Right ventricle Value Reference  TAPSE 16 mm ---------  Legend:  (L) and (H) mark values outside specified reference range.  -------------------------------------------------------------------  Prepared and Electronically Authenticated by  Kerry Hough, M.D.  2018-11-19T11:56:20  Physicians  Panel Physicians Referring Physician Case Authorizing Physician  Burnell Blanks, MD (Primary)    Procedures  RIGHT/LEFT HEART CATH AND CORONARY ANGIOGRAPHY  Conclusion  Prox RCA lesion is 10% stenosed.  Ost 2nd Mrg to 2nd Mrg lesion is 10% stenosed.  Prox LAD to Mid LAD lesion is 20% stenosed. 1. Mild non-obstructive CAD  2. Severe aortic stenosis by echo (unable to cross the aortic valve from the radial approach).  Recommendations: He will need referral to CT surgery to discuss surgical AVR. He is low risk for surgery and will not be a candidate for TAVR if his surgical risk is felt to be low.   Indications  Severe aortic stenosis [I35.0 (ICD-10-CM)]  Procedural Details/Technique  Technical Details Indication: 67 yo male with history of non-ischemic cardiomyopathy, severe AS, former tobacco abuse here today for cardiac cath as part of workup for his severe AS, probable surgical AVR.   Procedure: The risks, benefits, complications, treatment options, and expected outcomes were discussed with the patient. The patient and/or family concurred with the proposed plan, giving informed consent. The patient was brought to the cath lab after IV  hydration was given. The patient was further sedated with Versed and Fentanyl. There was an IV catheter present in the right antecubital vein. This area was prepped and draped. I then changed it out for a 5 [place] sheath. Right heart cath performed with a balloon tipped catheter. The right wrist was prepped and draped in a sterile fashion. 1% lidocaine was used for local  anesthesia. Using the modified Seldinger access technique, a 5 French sheath was placed in the right radial artery. 3 mg Verapamil was given through the sheath. 4000 units IV heparin was given. Standard diagnostic catheters were used to perform selective coronary angiography. I was unable to cross the aortic valve despite attempts with multiple catheters and a straight wire. The sheath was removed from the right radial artery and a Terumo hemostasis band was applied at the arteriotomy site on the right wrist.     Estimated blood loss <50 mL.  During this procedure the patient was administered the following to achieve and maintain moderate conscious sedation: Versed 3 mg, Fentanyl 75 mcg, while the patient's heart rate, blood pressure, and oxygen saturation were continuously monitored. The period of conscious sedation was 43 minutes, of which I was present face-to-face 100% of this time.  Complications  Complications documented before study signed (04/12/2017 11:35 AM EST)   RIGHT/LEFT HEART CATH AND CORONARY ANGIOGRAPHY   None Documented by Burnell Blanks, MD 04/12/2017 11:27 AM EST  Time Range: Intra-procedure    Coronary Findings  Diagnostic  Dominance: Right  Left Anterior Descending  Vessel is large.  Prox LAD to Mid LAD lesion 20% stenosed  Prox LAD to Mid LAD lesion is 20% stenosed.  First Diagonal Branch  Vessel is moderate in size.  Second Diagonal Branch  Vessel is moderate in size.  Left Circumflex  First Obtuse Marginal Branch  Vessel is small in size.  Second Obtuse Marginal Branch  Vessel is moderate in size.  Ost 2nd Mrg to 2nd Mrg lesion 10% stenosed  Ost 2nd Mrg to 2nd Mrg lesion is 10% stenosed.  Third Obtuse Marginal Branch  Vessel is small in size.  Right Coronary Artery  Vessel is large.  Prox RCA lesion 10% stenosed  Prox RCA lesion is 10% stenosed.  Intervention  No interventions have been documented.  Coronary Diagrams  Diagnostic Diagram      Implants     No implant documentation for this case.  MERGE Images  Link to Procedure Log   Show images for CARDIAC CATHETERIZATION Procedure Log  Hemo Data   Most Recent Value  RA A Wave 6 mmHg  RA V Wave 5 mmHg  RA Mean 4 mmHg  RV Systolic Pressure 29 mmHg  RV Diastolic Pressure 3 mmHg  RV EDP 6 mmHg  PA Systolic Pressure 29 mmHg  PA Diastolic Pressure 0 mmHg  PA Mean 22 mmHg  PW A Wave 13 mmHg  PW V Wave 11 mmHg  PW Mean 9 mmHg  AO Systolic Pressure 87 mmHg  AO Diastolic Pressure 51 mmHg  AO Mean 67 mmHg   Impression:   This 67 year old gentleman has stage D, severe, symptomatic aortic stenosis with New York Heart Association class II symptoms of exertional shortness of breath and fatigue as well as chest pressure consistent with chronic diastolic heart failure. He has moderate left ventricular systolic dysfunction with ejection fraction of 35-40% compared to 60-65% in 2011. This is probably the end result of untreated severe aortic stenosis. I have personally reviewed his echocardiogram and  cardiac catheterization. His echocardiogram shows severe calcification of the aortic annulus and leaflets with markedly restricted mobility and severe aortic stenosis. He has moderate left ventricular hypertrophy. His aorta appears normal size. He had a CTA of the chest on 04/03/2017 which showed no aortic aneurysm. His cardiac catheterization shows minimal nonobstructive coronary disease. I agree that aortic valve replacement is indicated in this patient to prevent progressive left ventricular deterioration and to improve his symptoms. He is in the low risk group for open surgical aortic valve replacement and I do not think TAVR is an option for him. I discussed the options of bioprosthetic and mechanical valves. We reviewed the pros and cons of both. Given his age of 16 years I think a bioprosthetic valve would work well for him and avoid the need for long-term anticoagulation with Coumadin. He is  in agreement with that.  I discussed the operative procedure with the patient and his wife including alternatives, benefits and risks; including but not limited to bleeding, blood transfusion, infection, stroke, myocardial infarction, graft failure, heart block requiring a permanent pacemaker, organ dysfunction, and death. [name] understands and agrees to proceed.   Plan:   Aortic valve replacement using a bioprosthetic valve.  Gaye Pollack, MD  [facility]  [phone]

## 2017-05-18 ENCOUNTER — Inpatient Hospital Stay (HOSPITAL_COMMUNITY): Payer: Medicare Other | Admitting: Certified Registered Nurse Anesthetist

## 2017-05-18 ENCOUNTER — Encounter (HOSPITAL_COMMUNITY)
Admission: RE | Disposition: A | Discharge status: Home / Self Care | Payer: Self-pay | Source: Ambulatory Visit | Attending: Surgery

## 2017-05-18 ENCOUNTER — Encounter (HOSPITAL_COMMUNITY): Payer: Self-pay

## 2017-05-18 ENCOUNTER — Ambulatory Visit (HOSPITAL_COMMUNITY): Payer: Medicare Other

## 2017-05-18 ENCOUNTER — Inpatient Hospital Stay (HOSPITAL_COMMUNITY): Payer: Medicare Other

## 2017-05-18 ENCOUNTER — Inpatient Hospital Stay (HOSPITAL_COMMUNITY)
Admission: RE | Admit: 2017-05-18 | Discharge: 2017-05-23 | DRG: 220 | Disposition: A | Discharge status: Home / Self Care | Payer: Medicare Other | Source: Ambulatory Visit | Attending: Surgery | Admitting: Surgery

## 2017-05-18 DIAGNOSIS — Z452 Encounter for adjustment and management of vascular access device: Secondary | ICD-10-CM | POA: Diagnosis not present

## 2017-05-18 DIAGNOSIS — I2510 Atherosclerotic heart disease of native coronary artery without angina pectoris: Secondary | ICD-10-CM | POA: Diagnosis present

## 2017-05-18 DIAGNOSIS — E8770 Fluid overload, unspecified: Secondary | ICD-10-CM | POA: Diagnosis present

## 2017-05-18 DIAGNOSIS — Z09 Encounter for follow-up examination after completed treatment for conditions other than malignant neoplasm: Secondary | ICD-10-CM

## 2017-05-18 DIAGNOSIS — I110 Hypertensive heart disease with heart failure: Secondary | ICD-10-CM | POA: Diagnosis present

## 2017-05-18 DIAGNOSIS — R42 Dizziness and giddiness: Secondary | ICD-10-CM | POA: Diagnosis not present

## 2017-05-18 DIAGNOSIS — I082 Rheumatic disorders of both aortic and tricuspid valves: Secondary | ICD-10-CM | POA: Diagnosis not present

## 2017-05-18 DIAGNOSIS — Z87891 Personal history of nicotine dependence: Secondary | ICD-10-CM

## 2017-05-18 DIAGNOSIS — Y838 Other surgical procedures as the cause of abnormal reaction of the patient, or of later complication, without mention of misadventure at the time of the procedure: Secondary | ICD-10-CM | POA: Diagnosis not present

## 2017-05-18 DIAGNOSIS — Z79899 Other long term (current) drug therapy: Secondary | ICD-10-CM

## 2017-05-18 DIAGNOSIS — Z4682 Encounter for fitting and adjustment of non-vascular catheter: Secondary | ICD-10-CM | POA: Diagnosis not present

## 2017-05-18 DIAGNOSIS — Z811 Family history of alcohol abuse and dependence: Secondary | ICD-10-CM | POA: Diagnosis not present

## 2017-05-18 DIAGNOSIS — D62 Acute posthemorrhagic anemia: Secondary | ICD-10-CM | POA: Diagnosis not present

## 2017-05-18 DIAGNOSIS — I350 Nonrheumatic aortic (valve) stenosis: Secondary | ICD-10-CM | POA: Diagnosis not present

## 2017-05-18 DIAGNOSIS — I10 Essential (primary) hypertension: Secondary | ICD-10-CM | POA: Diagnosis not present

## 2017-05-18 DIAGNOSIS — Z7982 Long term (current) use of aspirin: Secondary | ICD-10-CM | POA: Diagnosis not present

## 2017-05-18 DIAGNOSIS — Z952 Presence of prosthetic heart valve: Secondary | ICD-10-CM

## 2017-05-18 DIAGNOSIS — I440 Atrioventricular block, first degree: Secondary | ICD-10-CM | POA: Diagnosis present

## 2017-05-18 DIAGNOSIS — R7303 Prediabetes: Secondary | ICD-10-CM | POA: Diagnosis present

## 2017-05-18 DIAGNOSIS — I428 Other cardiomyopathies: Secondary | ICD-10-CM | POA: Diagnosis not present

## 2017-05-18 DIAGNOSIS — I97190 Other postprocedural cardiac functional disturbances following cardiac surgery: Secondary | ICD-10-CM | POA: Diagnosis not present

## 2017-05-18 DIAGNOSIS — I5032 Chronic diastolic (congestive) heart failure: Secondary | ICD-10-CM | POA: Diagnosis not present

## 2017-05-18 DIAGNOSIS — Z8379 Family history of other diseases of the digestive system: Secondary | ICD-10-CM | POA: Diagnosis not present

## 2017-05-18 DIAGNOSIS — J90 Pleural effusion, not elsewhere classified: Secondary | ICD-10-CM | POA: Diagnosis not present

## 2017-05-18 DIAGNOSIS — I358 Other nonrheumatic aortic valve disorders: Secondary | ICD-10-CM | POA: Diagnosis not present

## 2017-05-18 DIAGNOSIS — I4891 Unspecified atrial fibrillation: Secondary | ICD-10-CM | POA: Diagnosis not present

## 2017-05-18 DIAGNOSIS — I351 Nonrheumatic aortic (valve) insufficiency: Secondary | ICD-10-CM | POA: Diagnosis not present

## 2017-05-18 DIAGNOSIS — Z8249 Family history of ischemic heart disease and other diseases of the circulatory system: Secondary | ICD-10-CM | POA: Diagnosis not present

## 2017-05-18 DIAGNOSIS — J9811 Atelectasis: Secondary | ICD-10-CM | POA: Diagnosis not present

## 2017-05-18 HISTORY — PX: AORTIC VALVE REPLACEMENT: SHX41

## 2017-05-18 HISTORY — PX: TEE WITHOUT CARDIOVERSION: SHX5443

## 2017-05-18 LAB — POCT I-STAT, CHEM 8
BUN: 12 mg/dL (ref 6–20)
BUN: 10 mg/dL (ref 6–20)
BUN: 11 mg/dL (ref 6–20)
BUN: 12 mg/dL (ref 6–20)
BUN: 11 mg/dL (ref 6–20)
CALCIUM ION: 1 mmol/L — AB (ref 1.15–1.40)
CALCIUM ION: 1.19 mmol/L (ref 1.15–1.40)
CALCIUM ION: 1.15 mmol/L (ref 1.15–1.40)
CREATININE: 0.9 mg/dL (ref 0.61–1.24)
CREATININE: 0.8 mg/dL (ref 0.61–1.24)
CREATININE: 1 mg/dL (ref 0.61–1.24)
CREATININE: 1.2 mg/dL (ref 0.61–1.24)
Calcium, Ion: 1.24 mmol/L (ref 1.15–1.40)
Calcium, Ion: 0.97 mmol/L — ABNORMAL LOW (ref 1.15–1.40)
Chloride: 103 mmol/L (ref 101–111)
Chloride: 100 mmol/L — ABNORMAL LOW (ref 101–111)
Chloride: 104 mmol/L (ref 101–111)
Chloride: 101 mmol/L (ref 101–111)
Chloride: 106 mmol/L (ref 101–111)
Creatinine, Ser: 0.9 mg/dL (ref 0.61–1.24)
GLUCOSE: 98 mg/dL (ref 65–99)
GLUCOSE: 107 mg/dL — AB (ref 65–99)
GLUCOSE: 115 mg/dL — AB (ref 65–99)
Glucose, Bld: 118 mg/dL — ABNORMAL HIGH (ref 65–99)
Glucose, Bld: 107 mg/dL — ABNORMAL HIGH (ref 65–99)
HCT: 39 % (ref 39.0–52.0)
HCT: 26 % — ABNORMAL LOW (ref 39.0–52.0)
HCT: 34 % — ABNORMAL LOW (ref 39.0–52.0)
HCT: 26 % — ABNORMAL LOW (ref 39.0–52.0)
HEMATOCRIT: 30 % — AB (ref 39.0–52.0)
HEMOGLOBIN: 8.8 g/dL — AB (ref 13.0–17.0)
HEMOGLOBIN: 10.2 g/dL — AB (ref 13.0–17.0)
Hemoglobin: 13.3 g/dL (ref 13.0–17.0)
Hemoglobin: 11.6 g/dL — ABNORMAL LOW (ref 13.0–17.0)
Hemoglobin: 8.8 g/dL — ABNORMAL LOW (ref 13.0–17.0)
POTASSIUM: 4.2 mmol/L (ref 3.5–5.1)
POTASSIUM: 4.6 mmol/L (ref 3.5–5.1)
Potassium: 4.4 mmol/L (ref 3.5–5.1)
Potassium: 4.5 mmol/L (ref 3.5–5.1)
Potassium: 5.8 mmol/L — ABNORMAL HIGH (ref 3.5–5.1)
SODIUM: 141 mmol/L (ref 135–145)
Sodium: 137 mmol/L (ref 135–145)
Sodium: 140 mmol/L (ref 135–145)
Sodium: 139 mmol/L (ref 135–145)
Sodium: 139 mmol/L (ref 135–145)
TCO2: 28 mmol/L (ref 22–32)
TCO2: 25 mmol/L (ref 22–32)
TCO2: 28 mmol/L (ref 22–32)
TCO2: 26 mmol/L (ref 22–32)
TCO2: 24 mmol/L (ref 22–32)

## 2017-05-18 LAB — CBC
HCT: 28.5 % — ABNORMAL LOW (ref 39.0–52.0)
HCT: 32.6 % — ABNORMAL LOW (ref 39.0–52.0)
Hemoglobin: 9.5 g/dL — ABNORMAL LOW (ref 13.0–17.0)
Hemoglobin: 10.7 g/dL — ABNORMAL LOW (ref 13.0–17.0)
MCH: 30.4 pg (ref 26.0–34.0)
MCH: 30.4 pg (ref 26.0–34.0)
MCHC: 33.3 g/dL (ref 30.0–36.0)
MCHC: 32.8 g/dL (ref 30.0–36.0)
MCV: 91.1 fL (ref 78.0–100.0)
MCV: 92.6 fL (ref 78.0–100.0)
PLATELETS: 112 10*3/uL — AB (ref 150–400)
PLATELETS: 139 10*3/uL — AB (ref 150–400)
RBC: 3.13 MIL/uL — AB (ref 4.22–5.81)
RBC: 3.52 MIL/uL — ABNORMAL LOW (ref 4.22–5.81)
RDW: 12.7 % (ref 11.5–15.5)
RDW: 12.9 % (ref 11.5–15.5)
WBC: 5.6 10*3/uL (ref 4.0–10.5)
WBC: 7.4 10*3/uL (ref 4.0–10.5)

## 2017-05-18 LAB — POCT I-STAT 3, ART BLOOD GAS (G3+)
ACID-BASE DEFICIT: 2 mmol/L (ref 0.0–2.0)
ACID-BASE EXCESS: 4 mmol/L — AB (ref 0.0–2.0)
Acid-base deficit: 1 mmol/L (ref 0.0–2.0)
Acid-base deficit: 4 mmol/L — ABNORMAL HIGH (ref 0.0–2.0)
Acid-base deficit: 1 mmol/L (ref 0.0–2.0)
BICARBONATE: 23.9 mmol/L (ref 20.0–28.0)
BICARBONATE: 24.9 mmol/L (ref 20.0–28.0)
Bicarbonate: 28.5 mmol/L — ABNORMAL HIGH (ref 20.0–28.0)
Bicarbonate: 23.9 mmol/L (ref 20.0–28.0)
Bicarbonate: 25.6 mmol/L (ref 20.0–28.0)
O2 SAT: 98 %
O2 SAT: 96 %
O2 SAT: 97 %
O2 Saturation: 100 %
O2 Saturation: 97 %
PCO2 ART: 38.9 mmHg (ref 32.0–48.0)
PCO2 ART: 55.4 mmHg — AB (ref 32.0–48.0)
PO2 ART: 93 mmHg (ref 83.0–108.0)
Patient temperature: 35.7
TCO2: 30 mmol/L (ref 22–32)
TCO2: 25 mmol/L (ref 22–32)
TCO2: 26 mmol/L (ref 22–32)
TCO2: 27 mmol/L (ref 22–32)
TCO2: 26 mmol/L (ref 22–32)
pCO2 arterial: 43.9 mmHg (ref 32.0–48.0)
pCO2 arterial: 55.9 mmHg — ABNORMAL HIGH (ref 32.0–48.0)
pCO2 arterial: 53.2 mmHg — ABNORMAL HIGH (ref 32.0–48.0)
pH, Arterial: 7.42 (ref 7.350–7.450)
pH, Arterial: 7.392 (ref 7.350–7.450)
pH, Arterial: 7.245 — ABNORMAL LOW (ref 7.350–7.450)
pH, Arterial: 7.274 — ABNORMAL LOW (ref 7.350–7.450)
pH, Arterial: 7.284 — ABNORMAL LOW (ref 7.350–7.450)
pO2, Arterial: 337 mmHg — ABNORMAL HIGH (ref 83.0–108.0)
pO2, Arterial: 102 mmHg (ref 83.0–108.0)
pO2, Arterial: 108 mmHg (ref 83.0–108.0)
pO2, Arterial: 112 mmHg — ABNORMAL HIGH (ref 83.0–108.0)

## 2017-05-18 LAB — GLUCOSE, CAPILLARY
GLUCOSE-CAPILLARY: 65 mg/dL (ref 65–99)
GLUCOSE-CAPILLARY: 138 mg/dL — AB (ref 65–99)
GLUCOSE-CAPILLARY: 111 mg/dL — AB (ref 65–99)
Glucose-Capillary: 150 mg/dL — ABNORMAL HIGH (ref 65–99)
Glucose-Capillary: 72 mg/dL (ref 65–99)

## 2017-05-18 LAB — POCT I-STAT 4, (NA,K, GLUC, HGB,HCT)
Glucose, Bld: 81 mg/dL (ref 65–99)
HEMATOCRIT: 26 % — AB (ref 39.0–52.0)
Hemoglobin: 8.8 g/dL — ABNORMAL LOW (ref 13.0–17.0)
Potassium: 3.7 mmol/L (ref 3.5–5.1)
Sodium: 142 mmol/L (ref 135–145)

## 2017-05-18 LAB — CREATININE, SERUM
CREATININE: 1.24 mg/dL (ref 0.61–1.24)
GFR calc non Af Amer: 59 mL/min — ABNORMAL LOW (ref >60)

## 2017-05-18 LAB — HEMOGLOBIN AND HEMATOCRIT, BLOOD
HCT: 32.9 % — ABNORMAL LOW (ref 39.0–52.0)
Hemoglobin: 11.5 g/dL — ABNORMAL LOW (ref 13.0–17.0)

## 2017-05-18 LAB — PLATELET COUNT: Platelets: 155 10*3/uL (ref 150–400)

## 2017-05-18 LAB — PROTIME-INR
INR: 1.43
PROTHROMBIN TIME: 17.3 s — AB (ref 11.4–15.2)

## 2017-05-18 LAB — APTT: APTT: 38 s — AB (ref 24–36)

## 2017-05-18 LAB — MAGNESIUM: Magnesium: 3.3 mg/dL — ABNORMAL HIGH (ref 1.7–2.4)

## 2017-05-18 SURGERY — REPLACEMENT, AORTIC VALVE, OPEN
Anesthesia: General | Site: Chest

## 2017-05-18 MED ORDER — METOCLOPRAMIDE HCL 5 MG/ML IJ SOLN
10.0000 mg | Freq: Four times a day (QID) | INTRAMUSCULAR | Status: AC
  Administered 2017-05-18 – 2017-05-19 (×3): 10 mg via INTRAVENOUS
  Filled 2017-05-18 (×2): qty 2

## 2017-05-18 MED ORDER — DEXTROSE 5 % IV SOLN
1.5000 g | Freq: Two times a day (BID) | INTRAVENOUS | Status: AC
  Administered 2017-05-18 – 2017-05-20 (×4): 1.5 g via INTRAVENOUS
  Filled 2017-05-18 (×4): qty 1.5

## 2017-05-18 MED ORDER — LORATADINE 10 MG PO TABS
10.0000 mg | ORAL_TABLET | Freq: Every day | ORAL | Status: DC
  Administered 2017-05-19 – 2017-05-23 (×5): 10 mg via ORAL
  Filled 2017-05-18 (×5): qty 1

## 2017-05-18 MED ORDER — BISACODYL 10 MG RE SUPP: 10.0000 mg | Freq: Every day | RECTAL | Status: DC

## 2017-05-18 MED ORDER — MAGNESIUM SULFATE 4 GM/100ML IV SOLN
4.0000 g | Freq: Once | INTRAVENOUS | Status: AC
  Administered 2017-05-18: 4 g via INTRAVENOUS
  Filled 2017-05-18: qty 100

## 2017-05-18 MED ORDER — SODIUM CHLORIDE 0.9 % IV SOLN
0.0000 ug/min | INTRAVENOUS | Status: DC
  Filled 2017-05-18: qty 2

## 2017-05-18 MED ORDER — LACTATED RINGERS IV SOLN: 500.0000 mL | Freq: Once | INTRAVENOUS | Status: DC | PRN

## 2017-05-18 MED ORDER — INSULIN REGULAR BOLUS VIA INFUSION
0.0000 [IU] | Freq: Three times a day (TID) | INTRAVENOUS | Status: DC
  Filled 2017-05-18: qty 10

## 2017-05-18 MED ORDER — ORAL CARE MOUTH RINSE
15.0000 mL | Freq: Four times a day (QID) | OROMUCOSAL | Status: DC
  Administered 2017-05-18 (×2): 15 mL via OROMUCOSAL

## 2017-05-18 MED ORDER — SODIUM CHLORIDE 0.9% FLUSH: 3.0000 mL | INTRAVENOUS | Status: DC | PRN

## 2017-05-18 MED ORDER — PANTOPRAZOLE SODIUM 40 MG PO TBEC
40.0000 mg | DELAYED_RELEASE_TABLET | Freq: Every day | ORAL | Status: DC
  Administered 2017-05-20 – 2017-05-22 (×3): 40 mg via ORAL
  Filled 2017-05-18 (×3): qty 1

## 2017-05-18 MED ORDER — METOPROLOL TARTRATE 5 MG/5ML IV SOLN
2.5000 mg | INTRAVENOUS | Status: DC | PRN
  Administered 2017-05-18: 2.5 mg via INTRAVENOUS
  Administered 2017-05-20: 5 mg via INTRAVENOUS
  Administered 2017-05-20: 2.5 mg via INTRAVENOUS
  Filled 2017-05-18 (×2): qty 5

## 2017-05-18 MED ORDER — ACETAMINOPHEN 160 MG/5ML PO SOLN
1000.0000 mg | Freq: Four times a day (QID) | ORAL | Status: DC
  Filled 2017-05-18: qty 40.6

## 2017-05-18 MED ORDER — VANCOMYCIN HCL IN DEXTROSE 1-5 GM/200ML-% IV SOLN
1000.0000 mg | Freq: Once | INTRAVENOUS | Status: AC
  Administered 2017-05-18: 1000 mg via INTRAVENOUS
  Filled 2017-05-18: qty 200

## 2017-05-18 MED ORDER — CHLORHEXIDINE GLUCONATE 0.12 % MT SOLN
15.0000 mL | OROMUCOSAL | Status: AC
  Administered 2017-05-18: 15 mL via OROMUCOSAL

## 2017-05-18 MED ORDER — CHLORHEXIDINE GLUCONATE 0.12% ORAL RINSE (MEDLINE KIT)
15.0000 mL | Freq: Two times a day (BID) | OROMUCOSAL | Status: DC
  Administered 2017-05-18: 15 mL via OROMUCOSAL

## 2017-05-18 MED ORDER — PROTAMINE SULFATE 10 MG/ML IV SOLN
INTRAVENOUS | Status: DC | PRN
  Administered 2017-05-18: 180 mg via INTRAVENOUS
  Administered 2017-05-18: 20 mg via INTRAVENOUS

## 2017-05-18 MED ORDER — SODIUM CHLORIDE 0.9 % IV SOLN: 250.0000 mL | INTRAVENOUS | Status: DC

## 2017-05-18 MED ORDER — SODIUM CHLORIDE 0.9% FLUSH
3.0000 mL | Freq: Two times a day (BID) | INTRAVENOUS | Status: DC
  Administered 2017-05-19 – 2017-05-22 (×6): 3 mL via INTRAVENOUS

## 2017-05-18 MED ORDER — MIDAZOLAM HCL 5 MG/5ML IJ SOLN
INTRAMUSCULAR | Status: DC | PRN
  Administered 2017-05-18: 1 mg via INTRAVENOUS
  Administered 2017-05-18: 3 mg via INTRAVENOUS
  Administered 2017-05-18: 4 mg via INTRAVENOUS
  Administered 2017-05-18: 2 mg via INTRAVENOUS

## 2017-05-18 MED ORDER — SODIUM CHLORIDE 0.45 % IV SOLN
INTRAVENOUS | Status: DC | PRN
  Administered 2017-05-18: 13:00:00 via INTRAVENOUS

## 2017-05-18 MED ORDER — DOCUSATE SODIUM 100 MG PO CAPS
200.0000 mg | ORAL_CAPSULE | Freq: Every day | ORAL | Status: DC
  Administered 2017-05-19 – 2017-05-21 (×3): 200 mg via ORAL
  Filled 2017-05-18 (×4): qty 2

## 2017-05-18 MED ORDER — SODIUM CHLORIDE 0.9 % IV SOLN
0.0000 ug/kg/h | INTRAVENOUS | Status: DC
  Administered 2017-05-18: 0.3 ug/kg/h via INTRAVENOUS
  Filled 2017-05-18 (×2): qty 2

## 2017-05-18 MED ORDER — TRAMADOL HCL 50 MG PO TABS
50.0000 mg | ORAL_TABLET | ORAL | Status: DC | PRN
  Administered 2017-05-20: 100 mg via ORAL
  Filled 2017-05-18: qty 2

## 2017-05-18 MED ORDER — ASPIRIN 81 MG PO CHEW: 324.0000 mg | CHEWABLE_TABLET | Freq: Every day | ORAL | Status: DC

## 2017-05-18 MED ORDER — MORPHINE SULFATE (PF) 4 MG/ML IV SOLN
1.0000 mg | INTRAVENOUS | Status: AC | PRN
Start: 2017-05-18 — End: 2017-05-19

## 2017-05-18 MED ORDER — FENTANYL CITRATE (PF) 250 MCG/5ML IJ SOLN
INTRAMUSCULAR | Status: AC
  Filled 2017-05-18: qty 20

## 2017-05-18 MED ORDER — LIDOCAINE 2% (20 MG/ML) 5 ML SYRINGE
INTRAMUSCULAR | Status: AC
  Filled 2017-05-18: qty 5

## 2017-05-18 MED ORDER — OXYCODONE HCL 5 MG PO TABS
5.0000 mg | ORAL_TABLET | ORAL | Status: DC | PRN
  Administered 2017-05-19: 10 mg via ORAL
  Administered 2017-05-19: 5 mg via ORAL
  Administered 2017-05-19: 10 mg via ORAL
  Filled 2017-05-18: qty 1
  Filled 2017-05-18 (×2): qty 2

## 2017-05-18 MED ORDER — ETOMIDATE 2 MG/ML IV SOLN
INTRAVENOUS | Status: DC | PRN
  Administered 2017-05-18: 14 mg via INTRAVENOUS

## 2017-05-18 MED ORDER — LACTATED RINGERS IV SOLN
INTRAVENOUS | Status: DC | PRN
  Administered 2017-05-18: 08:00:00 via INTRAVENOUS

## 2017-05-18 MED ORDER — FAMOTIDINE IN NACL 20-0.9 MG/50ML-% IV SOLN
20.0000 mg | Freq: Two times a day (BID) | INTRAVENOUS | Status: AC
  Administered 2017-05-18 (×2): 20 mg via INTRAVENOUS
  Filled 2017-05-18: qty 50

## 2017-05-18 MED ORDER — FENTANYL CITRATE (PF) 250 MCG/5ML IJ SOLN
INTRAMUSCULAR | Status: DC | PRN
  Administered 2017-05-18: 50 ug via INTRAVENOUS
  Administered 2017-05-18 (×2): 100 ug via INTRAVENOUS
  Administered 2017-05-18: 150 ug via INTRAVENOUS
  Administered 2017-05-18: 100 ug via INTRAVENOUS
  Administered 2017-05-18: 250 ug via INTRAVENOUS
  Administered 2017-05-18: 450 ug via INTRAVENOUS
  Administered 2017-05-18: 250 ug via INTRAVENOUS
  Administered 2017-05-18 (×2): 150 ug via INTRAVENOUS

## 2017-05-18 MED ORDER — MORPHINE SULFATE (PF) 4 MG/ML IV SOLN
2.0000 mg | INTRAVENOUS | Status: DC | PRN
  Administered 2017-05-18 – 2017-05-19 (×4): 2 mg via INTRAVENOUS
  Filled 2017-05-18 (×3): qty 1

## 2017-05-18 MED ORDER — ALBUMIN HUMAN 5 % IV SOLN
INTRAVENOUS | Status: DC | PRN
  Administered 2017-05-18 (×2): via INTRAVENOUS

## 2017-05-18 MED ORDER — LACTATED RINGERS IV SOLN: INTRAVENOUS | Status: DC

## 2017-05-18 MED ORDER — INSULIN ASPART 100 UNIT/ML ~~LOC~~ SOLN
0.0000 [IU] | SUBCUTANEOUS | Status: DC
  Administered 2017-05-19: 2 [IU] via SUBCUTANEOUS

## 2017-05-18 MED ORDER — DEXTROSE 5 % IV SOLN
INTRAVENOUS | Status: DC | PRN
  Administered 2017-05-18: 750 mg via INTRAVENOUS

## 2017-05-18 MED ORDER — PROTAMINE SULFATE 10 MG/ML IV SOLN
INTRAVENOUS | Status: AC
  Filled 2017-05-18: qty 25

## 2017-05-18 MED ORDER — LACTATED RINGERS IV SOLN
INTRAVENOUS | Status: DC | PRN
  Administered 2017-05-18: 07:00:00 via INTRAVENOUS

## 2017-05-18 MED ORDER — ALBUMIN HUMAN 5 % IV SOLN
250.0000 mL | INTRAVENOUS | Status: AC | PRN
  Administered 2017-05-18: 250 mL via INTRAVENOUS

## 2017-05-18 MED ORDER — MIDAZOLAM HCL 2 MG/2ML IJ SOLN: 2.0000 mg | INTRAMUSCULAR | Status: DC | PRN

## 2017-05-18 MED ORDER — HEPARIN SODIUM (PORCINE) 1000 UNIT/ML IJ SOLN
INTRAMUSCULAR | Status: DC | PRN
  Administered 2017-05-18: 23000 [IU] via INTRAVENOUS

## 2017-05-18 MED ORDER — METOPROLOL TARTRATE 12.5 MG HALF TABLET: 12.5000 mg | ORAL_TABLET | Freq: Two times a day (BID) | ORAL | Status: DC

## 2017-05-18 MED ORDER — FENTANYL CITRATE (PF) 250 MCG/5ML IJ SOLN
INTRAMUSCULAR | Status: AC
  Filled 2017-05-18: qty 10

## 2017-05-18 MED ORDER — HEMOSTATIC AGENTS (NO CHARGE) OPTIME
TOPICAL | Status: DC | PRN
  Administered 2017-05-18: 1 via TOPICAL

## 2017-05-18 MED ORDER — BISACODYL 5 MG PO TBEC
10.0000 mg | DELAYED_RELEASE_TABLET | Freq: Every day | ORAL | Status: DC
  Administered 2017-05-19 – 2017-05-21 (×3): 10 mg via ORAL
  Filled 2017-05-18 (×4): qty 2

## 2017-05-18 MED ORDER — HEPARIN SODIUM (PORCINE) 1000 UNIT/ML IJ SOLN
INTRAMUSCULAR | Status: AC
  Filled 2017-05-18: qty 1

## 2017-05-18 MED ORDER — ONDANSETRON HCL 4 MG/2ML IJ SOLN
4.0000 mg | Freq: Four times a day (QID) | INTRAMUSCULAR | Status: DC | PRN
  Administered 2017-05-18 – 2017-05-19 (×2): 4 mg via INTRAVENOUS
  Filled 2017-05-18 (×3): qty 2

## 2017-05-18 MED ORDER — THROMBIN (RECOMBINANT) 5000 UNITS EX SOLR
CUTANEOUS | Status: AC
  Filled 2017-05-18: qty 15000

## 2017-05-18 MED ORDER — MIDAZOLAM HCL 10 MG/2ML IJ SOLN
INTRAMUSCULAR | Status: AC
  Filled 2017-05-18: qty 2

## 2017-05-18 MED ORDER — SODIUM CHLORIDE 0.9 % IV SOLN
INTRAVENOUS | Status: DC
  Administered 2017-05-18: 13:00:00 via INTRAVENOUS

## 2017-05-18 MED ORDER — SODIUM CHLORIDE 0.9% FLUSH
10.0000 mL | Freq: Two times a day (BID) | INTRAVENOUS | Status: DC
  Administered 2017-05-18: 20 mL
  Administered 2017-05-18 – 2017-05-21 (×5): 10 mL

## 2017-05-18 MED ORDER — SODIUM CHLORIDE 0.9 % IV SOLN
INTRAVENOUS | Status: DC
  Filled 2017-05-18: qty 1

## 2017-05-18 MED ORDER — POTASSIUM CHLORIDE 10 MEQ/50ML IV SOLN
10.0000 meq | INTRAVENOUS | Status: AC
  Administered 2017-05-18 (×3): 10 meq via INTRAVENOUS

## 2017-05-18 MED ORDER — THROMBIN (RECOMBINANT) 5000 UNITS EX SOLR
OROMUCOSAL | Status: DC | PRN
  Administered 2017-05-18 (×3): via TOPICAL

## 2017-05-18 MED ORDER — DEXTROSE 50 % IV SOLN
INTRAVENOUS | Status: AC
  Administered 2017-05-18: 14 mL
  Filled 2017-05-18: qty 50

## 2017-05-18 MED ORDER — CHLORHEXIDINE GLUCONATE 4 % EX LIQD: 30.0000 mL | CUTANEOUS | Status: DC

## 2017-05-18 MED ORDER — FENTANYL CITRATE (PF) 250 MCG/5ML IJ SOLN
INTRAMUSCULAR | Status: AC
  Filled 2017-05-18: qty 5

## 2017-05-18 MED ORDER — ACETAMINOPHEN 500 MG PO TABS
1000.0000 mg | ORAL_TABLET | Freq: Four times a day (QID) | ORAL | Status: DC
  Administered 2017-05-19 – 2017-05-22 (×14): 1000 mg via ORAL
  Filled 2017-05-18 (×13): qty 2

## 2017-05-18 MED ORDER — SODIUM CHLORIDE 0.9% FLUSH: 10.0000 mL | INTRAVENOUS | Status: DC | PRN

## 2017-05-18 MED ORDER — SODIUM BICARBONATE 8.4 % IV SOLN
50.0000 meq | Freq: Once | INTRAVENOUS | Status: AC
  Administered 2017-05-18: 50 meq via INTRAVENOUS

## 2017-05-18 MED ORDER — ETOMIDATE 2 MG/ML IV SOLN
INTRAVENOUS | Status: AC
  Filled 2017-05-18: qty 10

## 2017-05-18 MED ORDER — ROCURONIUM BROMIDE 10 MG/ML (PF) SYRINGE
PREFILLED_SYRINGE | INTRAVENOUS | Status: AC
  Filled 2017-05-18: qty 10

## 2017-05-18 MED ORDER — ROCURONIUM BROMIDE 10 MG/ML (PF) SYRINGE
PREFILLED_SYRINGE | INTRAVENOUS | Status: DC | PRN
  Administered 2017-05-18 (×4): 50 mg via INTRAVENOUS

## 2017-05-18 MED ORDER — ACETAMINOPHEN 160 MG/5ML PO SOLN: 650.0000 mg | Freq: Once | ORAL | Status: AC

## 2017-05-18 MED ORDER — LACTATED RINGERS IV SOLN
INTRAVENOUS | Status: DC
  Administered 2017-05-19: 08:00:00 via INTRAVENOUS

## 2017-05-18 MED ORDER — NITROGLYCERIN IN D5W 200-5 MCG/ML-% IV SOLN: 0.0000 ug/min | INTRAVENOUS | Status: DC

## 2017-05-18 MED ORDER — MORPHINE SULFATE (PF) 2 MG/ML IV SOLN: 1.0000 mg | INTRAVENOUS | Status: DC | PRN

## 2017-05-18 MED ORDER — CHLORHEXIDINE GLUCONATE CLOTH 2 % EX PADS
6.0000 | MEDICATED_PAD | Freq: Every day | CUTANEOUS | Status: DC
  Administered 2017-05-18 – 2017-05-22 (×5): 6 via TOPICAL

## 2017-05-18 MED ORDER — ASPIRIN EC 325 MG PO TBEC
325.0000 mg | DELAYED_RELEASE_TABLET | Freq: Every day | ORAL | Status: DC
  Administered 2017-05-19 – 2017-05-22 (×4): 325 mg via ORAL
  Filled 2017-05-18 (×4): qty 1

## 2017-05-18 MED ORDER — FENTANYL CITRATE (PF) 250 MCG/5ML IJ SOLN
INTRAMUSCULAR | Status: AC
Start: 2017-05-18 — End: ?
  Filled 2017-05-18: qty 5

## 2017-05-18 MED ORDER — 0.9 % SODIUM CHLORIDE (POUR BTL) OPTIME
TOPICAL | Status: DC | PRN
  Administered 2017-05-18: 5000 mL

## 2017-05-18 MED ORDER — ACETAMINOPHEN 650 MG RE SUPP
650.0000 mg | Freq: Once | RECTAL | Status: AC
  Administered 2017-05-18: 650 mg via RECTAL

## 2017-05-18 MED ORDER — METOPROLOL TARTRATE 25 MG/10 ML ORAL SUSPENSION: 12.5000 mg | Freq: Two times a day (BID) | ORAL | Status: DC

## 2017-05-18 MED ORDER — LACTATED RINGERS IV SOLN
INTRAVENOUS | Status: DC | PRN
Start: 2017-05-18 — End: 2017-05-18
  Administered 2017-05-18 (×2): via INTRAVENOUS

## 2017-05-18 SURGICAL SUPPLY — 64 items
ADAPTER CARDIO PERF ANTE/RETRO (ADAPTER) ×6 IMPLANT
BAG DECANTER FOR FLEXI CONT (MISCELLANEOUS) IMPLANT
BLADE STERNUM SYSTEM 6 (BLADE) ×3 IMPLANT
BLADE SURG 15 STRL LF DISP TIS (BLADE) ×2 IMPLANT
BLADE SURG 15 STRL SS (BLADE) ×1
CANISTER SUCT 3000ML PPV (MISCELLANEOUS) ×3 IMPLANT
CANNULA GUNDRY RCSP 15FR (MISCELLANEOUS) ×6 IMPLANT
CATH HEART VENT LEFT (CATHETERS) ×2 IMPLANT
CATH ROBINSON RED A/P 18FR (CATHETERS) ×9 IMPLANT
CATH THORACIC 36FR (CATHETERS) ×3 IMPLANT
CATH THORACIC 36FR RT ANG (CATHETERS) ×3 IMPLANT
CONT SPEC 4OZ CLIKSEAL STRL BL (MISCELLANEOUS) ×3 IMPLANT
COVER SURGICAL LIGHT HANDLE (MISCELLANEOUS) ×3 IMPLANT
CRADLE DONUT ADULT HEAD (MISCELLANEOUS) ×3 IMPLANT
DRAPE SLUSH/WARMER DISC (DRAPES) ×3 IMPLANT
DRSG COVADERM 4X14 (GAUZE/BANDAGES/DRESSINGS) ×3 IMPLANT
ELECT CAUTERY BLADE 6.4 (BLADE) ×3 IMPLANT
ELECT REM PT RETURN 9FT ADLT (ELECTROSURGICAL) ×6
ELECTRODE REM PT RTRN 9FT ADLT (ELECTROSURGICAL) ×4 IMPLANT
FELT TEFLON 1X6 (MISCELLANEOUS) ×6 IMPLANT
GAUZE SPONGE 4X4 12PLY STRL (GAUZE/BANDAGES/DRESSINGS) ×3 IMPLANT
GAUZE SPONGE 4X4 12PLY STRL LF (GAUZE/BANDAGES/DRESSINGS) ×3 IMPLANT
GLOVE BIO SURGEON STRL SZ 6 (GLOVE) IMPLANT
GLOVE BIO SURGEON STRL SZ 6.5 (GLOVE) ×21 IMPLANT
GLOVE BIO SURGEON STRL SZ7 (GLOVE) IMPLANT
GLOVE BIO SURGEON STRL SZ7.5 (GLOVE) IMPLANT
GLOVE EUDERMIC 7 POWDERFREE (GLOVE) ×6 IMPLANT
GOWN STRL REUS W/ TWL LRG LVL3 (GOWN DISPOSABLE) ×8 IMPLANT
GOWN STRL REUS W/ TWL XL LVL3 (GOWN DISPOSABLE) ×2 IMPLANT
GOWN STRL REUS W/TWL LRG LVL3 (GOWN DISPOSABLE) ×4
GOWN STRL REUS W/TWL XL LVL3 (GOWN DISPOSABLE) ×1
HEART VENT LT CURVED (MISCELLANEOUS) ×3 IMPLANT
HEMOSTAT POWDER SURGIFOAM 1G (HEMOSTASIS) ×9 IMPLANT
HEMOSTAT SURGICEL 2X14 (HEMOSTASIS) ×3 IMPLANT
KIT BASIN OR (CUSTOM PROCEDURE TRAY) ×3 IMPLANT
KIT CATH CPB BARTLE (MISCELLANEOUS) ×3 IMPLANT
KIT ROOM TURNOVER OR (KITS) ×3 IMPLANT
KIT SUCTION CATH 14FR (SUCTIONS) ×3 IMPLANT
LINE VENT (MISCELLANEOUS) ×3 IMPLANT
NS IRRIG 1000ML POUR BTL (IV SOLUTION) ×18 IMPLANT
PACK E OPEN HEART (SUTURE) ×3 IMPLANT
PACK OPEN HEART (CUSTOM PROCEDURE TRAY) ×3 IMPLANT
PAD ARMBOARD 7.5X6 YLW CONV (MISCELLANEOUS) ×6 IMPLANT
SET CARDIOPLEGIA MPS 5001102 (MISCELLANEOUS) ×3 IMPLANT
SUT BONE WAX W31G (SUTURE) ×3 IMPLANT
SUT ETHIBON 2 0 V 52N 30 (SUTURE) ×12 IMPLANT
SUT ETHIBON EXCEL 2-0 V-5 (SUTURE) ×3 IMPLANT
SUT PROLENE 3 0 SH DA (SUTURE) IMPLANT
SUT PROLENE 3 0 SH1 36 (SUTURE) ×6 IMPLANT
SUT PROLENE 4 0 RB 1 (SUTURE) ×4
SUT PROLENE 4-0 RB1 .5 CRCL 36 (SUTURE) ×8 IMPLANT
SUT STEEL 6MS V (SUTURE) IMPLANT
SUT STEEL SZ 6 DBL 3X14 BALL (SUTURE) ×9 IMPLANT
SUT VIC AB 1 CTX 36 (SUTURE) ×3
SUT VIC AB 1 CTX36XBRD ANBCTR (SUTURE) ×6 IMPLANT
SYSTEM SAHARA CHEST DRAIN ATS (WOUND CARE) ×3 IMPLANT
TAPE CLOTH SURG 4X10 WHT LF (GAUZE/BANDAGES/DRESSINGS) ×3 IMPLANT
TOWEL GREEN STERILE (TOWEL DISPOSABLE) ×3 IMPLANT
TOWEL GREEN STERILE FF (TOWEL DISPOSABLE) ×3 IMPLANT
TRAY FOLEY SILVER 16FR TEMP (SET/KITS/TRAYS/PACK) ×3 IMPLANT
UNDERPAD 30X30 (UNDERPADS AND DIAPERS) ×3 IMPLANT
VALVE MAGNA EASE AORTIC 23MM (Prosthesis & Implant Heart) ×3 IMPLANT
VENT LEFT HEART 12002 (CATHETERS) ×3
WATER STERILE IRR 1000ML POUR (IV SOLUTION) ×6 IMPLANT

## 2017-05-18 NOTE — Progress Notes (Signed)
  Echocardiogram Echocardiogram Transesophageal has been performed.  Jeremy Larson 05/18/2017, 8:44 AM

## 2017-05-18 NOTE — Progress Notes (Signed)
Placed pt back on rate of 12 d/t ABG ph of 7.25. Will attempt to wean again later.

## 2017-05-18 NOTE — Anesthesia Procedure Notes (Signed)
Central Venous Catheter Insertion Performed by: Annye Asa, MD, anesthesiologist Start/End1/07/2017 6:52 AM, 05/18/2017 7:08 AM Patient location: Pre-op. Preanesthetic checklist: patient identified, IV checked, risks and benefits discussed, surgical consent, monitors and equipment checked, pre-op evaluation, timeout performed and anesthesia consent Position: supine Lidocaine 1% used for infiltration and patient sedated Hand hygiene performed , maximum sterile barriers used  and Seldinger technique used Catheter size: 8.5 Fr PA cath was placed.Sheath introducer Swan type:thermodilution Procedure performed using ultrasound guided technique. Ultrasound Notes:anatomy identified, needle tip was noted to be adjacent to the nerve/plexus identified and no ultrasound evidence of intravascular and/or intraneural injection Attempts: 1 Following insertion, line sutured, dressing applied and Biopatch. Post procedure assessment: blood return through all ports, free fluid flow and no air  Patient tolerated the procedure well with no immediate complications. Additional procedure comments: PA catheter:  Routine monitors. Timeout, sterile prep, drape, FBP R neck.  Supine position.  1% [place]ido local, finder and trocar RIJ 1st pass with US guidance.  Cordis placed over J wire. PA catheter in easily.  Sterile dressing applied.  Patient tolerated well, VSS.  Jenita Seashore, MD.

## 2017-05-18 NOTE — Op Note (Signed)
CARDIOVASCULAR SURGERY OPERATIVE NOTE  05/18/2017 [name] [id number]  Surgeon:  Gaye Pollack, MD  First Assistant: Ellwood Handler,  PA-C   Preoperative Diagnosis:  Severe aortic stenosis   Postoperative Diagnosis:  Same   Procedure:  1. Median Sternotomy 2. Extracorporeal circulation 3.   Aortic valve replacement using a 23 mm Edwards Magna-Ease Pericardial valve.  Anesthesia:  General Endotracheal   Clinical History/Surgical Indication:  The patient is a 67 year old gentleman with a long history of a heart murmur and known bicuspid aortic valve found on echocardiogram in 2011. He was seen by Dr. Percival Spanish at that time and an echocardiogram in 01/2010 showed an ejection fraction of 60-65% with a mean gradient across aortic valve of 34 mmHg and a valve area of 1.12 cm. He denies any symptoms at that time and subsequently underwent an exercise tolerance test which was unremarkable. A decision was made to repeat his echocardiogram in 6 months but he did not return for follow-up. In October 2018 he was referred to Dr. Harl Bowie by his PCP, Dr. Quillian Quince, for further evaluation. He reports that he has been having shortness of breath with moderate exertion such as walking less than one half block or up one flight of stairs. He has had some associated chest tightness. He denies any dizziness or syncope. He has had fatigue. He underwent repeat echocardiogram on 04/03/2017 which showed severe aortic stenosis with a mean gradient of 51 mmHg and a peak gradient of 86 mmHg. The dimensionless index was 0.12. There was severe aortic annular calcification and severe thickening and calcification of the leaflets. The ejection fraction was moderately reduced to 35-40% with diffuse hypokinesis and grade 1 diastolic dysfunction. His echo in 2011 had shown normal left ventricular ejection fraction of 60-65%. Cardiac catheterization was performed by Dr. Angelena Form on 04/12/2017 and this showed mild nonobstructive  coronary disease.   He has stage D, severe, symptomatic aortic stenosis with New York Heart Association class II symptoms of exertional shortness of breath and fatigue as well as chest pressure consistent with chronic diastolic heart failure. He has moderate left ventricular systolic dysfunction with ejection fraction of 35-40% compared to 60-65% in 2011. This is probably the end result of untreated severe aortic stenosis. I have personally reviewed his echocardiogram and cardiac catheterization. His echocardiogram shows severe calcification of the aortic annulus and leaflets with markedly restricted mobility and severe aortic stenosis. He has moderate left ventricular hypertrophy. His aorta appears normal size. He had a CTA of the chest on 04/03/2017 which showed no aortic aneurysm. His cardiac catheterization shows minimal nonobstructive coronary disease. I agree that aortic valve replacement is indicated in this patient to prevent progressive left ventricular deterioration and to improve his symptoms. He is in the low risk group for open surgical aortic valve replacement and I do not think TAVR is an option for him. I discussed the options of bioprosthetic and mechanical valves. We reviewed the pros and cons of both. Given his age of 67 years I think a bioprosthetic valve would work well for him and avoid the need for long-term anticoagulation with Coumadin. He is in agreement with that.  I discussed the operative procedure with the patient and his wife including alternatives, benefits and risks; including but not limited to bleeding, blood transfusion, infection, stroke, myocardial infarction, graft failure, heart block requiring a permanent pacemaker, organ dysfunction, and death. [name] understands and agrees to proceed.    Preparation:  The patient was seen in the preoperative  holding area and the correct patient, correct operation were confirmed with the patient after reviewing the medical  record and catheterization. The consent was signed by me. Preoperative antibiotics were given. A pulmonary arterial line and radial arterial line were placed by the anesthesia team. The patient was taken back to the operating room and positioned supine on the operating room table. After being placed under general endotracheal anesthesia by the anesthesia team a foley catheter was placed. The neck, chest, abdomen, and both legs were prepped with betadine soap and solution and draped in the usual sterile manner. A surgical time-out was taken and the correct patient and operative procedure were confirmed with the nursing and anesthesia staff.   TEE:   Complete TEE assessment was performed by Dr. Annye Asa.                             *[facility]*                         [address] [place], [place] [address]                            [phone]  ------------------------------------------------------------------- Intraoperative Transesophageal Echocardiography  Patient:    Mahonri, Seiden MR #:       [id number] Study Date: 05/18/2017 Gender:     M Age:        67 Height:     175.3 cm Weight:     75.9 kg BSA:        1.93 m^2 Pt. Status: Room:       2H01C   ADMITTING    Gilford Raid, MD  ATTENDING    Gilford Raid, MD  ORDERING     Gilford Raid, MD  REFERRING    Gilford Raid, MD  PERFORMING   Annye Asa, MD  SONOGRAPHER  Roseanna Rainbow  cc:  ------------------------------------------------------------------- LV EF: 40% -   45%  ------------------------------------------------------------------- Indications:      Aortic stenosis 424.1.  Dizziness 780.4.  ------------------------------------------------------------------- History:   Risk factors:  Hypertension.  ------------------------------------------------------------------- Study Conclusions  - Left ventricle: Mild, concentric,  hypertrophy was noted. Systolic   function was mildly to moderately reduced. The estimated ejection   fraction was in the range of 40% to 45%. There is diffuse   hypokinesis. - Aortic valve: Normal-sized, mildly calcified annulus. 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 Cusp separation was severely reduced. Transvalvular   velocity was increased, due to stenosis. There was severe   stenosis. The peak gradient is 99 mmHg, with a mean gradient of   60 mmHg. There was trivial regurgitation directed towards the   mitral valve anterior leaflet. - Staged echo: Limited post-CPB exam: Improved EF, with vigorous LV   function. Overall EF 50-60%. There are no wall motion   abnormalities. The prosthetic aortic valve is well seated in the   aortic annulus. No AI seen in LV outflow tract. Post aortic valve   replacement images demonstrate no residual valvular  insufficiency   or perivalvular leak. There is essentially no change in mitral   valve function.  Impressions:  - Post aortic valve replacement surgery, the prosthetic valve   appears to be functioning normally. Excellent prosthetic aortic   valve function without evidence for perivalvular leak. Other   valves are essentially unchanged. LV function improvement from   pre-bypass images, with EF 50-60%. The small amount of   intracardiac air seen at the end of CPB was evacuated prior to   separation from pump. No other significant changes from   pre-bypass images.  ------------------------------------------------------------------- Study data:   Study status:  Routine.  Consent:  The risks, benefits, and alternatives to the procedure were explained to the patient and informed consent was obtained.  Procedure:  The patient reported no pain pre or post test. Sedation. General anesthesia was administered by anesthesiology staff.  Transesophageal echocardiography. An adult multiplane transesophageal probe was inserted by the anesthesiologistwithout difficulty. Image quality was adequate.  Study completion:  There were no complications.     Intraoperative transesophageal echocardiography.  Birthdate: Patient birthdate: 1951-03-17.  Age:  Patient is 68 yr old.  Sex: Gender: male.    BMI: 24.7 kg/m^2.  Blood pressure:     145/81 Patient status:  Inpatient.  Study date:  Study date: 05/18/2017. Study time: 07:25 AM.  Location:  Operating room.  -------------------------------------------------------------------  ------------------------------------------------------------------- Left ventricle:  Mild, concentric, hypertrophy was noted. Systolic function was mildly to moderately reduced. The estimated ejection fraction was in the range of 40% to 45%. There is diffuse hypokinesis.  ------------------------------------------------------------------- Aortic valve:  Normal-sized, mildly calcified annulus. The valve is functionally bicuspid; with severely thickened, severely calcified leaflets; there is fusion of the right-left coronary commissure. Cusp separation was severely reduced.  Doppler:  Transvalvular velocity was increased, due to stenosis. There was severe stenosis. The peak gradient is 99 mmHg, with a mean gradient of 60 mmHg. There was trivial regurgitation directed towards the mitral valve anterior leaflet.  ------------------------------------------------------------------- Aorta:  The aorta was only mildly atheromatous.  ------------------------------------------------------------------- Mitral valve:   Noncalcified annulus. Structurally normal valve. Normal thickness leaflets . Leaflet separation was normal. Mobility was not restricted. No echocardiographic evidence for prolapse.  No evidence of vegetation.  Doppler:  Transvalvular velocity was within the normal range. There was no evidence for  stenosis. There was no regurgitation.  ------------------------------------------------------------------- Left atrium:   No evidence of thrombus in the atrial cavity or appendage.  ------------------------------------------------------------------- Atrial septum:  No defect or patent foramen ovale was identified on color doppler interrogation.  ------------------------------------------------------------------- Pulmonary veins:  Visualization of the pulmonary venous anatomy is incomplete, but a significant abnormality is unlikely.  ------------------------------------------------------------------- Right ventricle:  The cavity size was normal. Wall thickness was normal. Systolic function was normal.  ------------------------------------------------------------------- Pulmonic valve:    Structurally normal valve.   Cusp separation was normal.  No evidence of vegetation.  Doppler:  There was trivial regurgitation around the PA catheter.  ------------------------------------------------------------------- Tricuspid valve:   Structurally normal valve.   Leaflet separation was normal.  No evidence of vegetation.  Doppler:  There was trivial regurgitation.  ------------------------------------------------------------------- Pulmonary artery:   The main pulmonary artery was normal-sized.  ------------------------------------------------------------------- Right atrium:  The atrium was normal in size.  No evidence of thrombus.  ------------------------------------------------------------------- Pre bypass:  Post bypass:  ------------------------------------------------------------------- Post procedure conclusions Left ventricle:  Limited post-CPB exam: Improved EF, with vigorous LV function. Overall EF 50-60%. There are no wall motion abnormalities. Aortic valve:  - The prosthetic aortic  valve is well seated in the aortic annulus.   No AI seen in LV outflow  tract. Post aortic valve replacement   images demonstrate no residual valvular insufficiency or   perivalvular leak.  Mitral valve:  - There is essentially no change in mitral valve function.  Ascending Aorta:  - The aorta was only mildly atheromatous.  ------------------------------------------------------------------- Prepared and Electronically Authenticated by  Annye Asa, MD 2019-01-03T13:28:49    Cardiopulmonary Bypass:  A median sternotomy was performed. The pericardium was opened in the midline. Right ventricular function appeared normal. The ascending aorta was of normal size and had no palpable plaque. There were no contraindications to aortic cannulation or cross-clamping. The patient was fully systemically heparinized and the ACT was maintained > 400 sec. The proximal aortic arch was cannulated with a 20 F aortic cannula for arterial inflow. Venous cannulation was performed via the right atrial appendage using a two-staged venous cannula. An antegrade cardioplegia/vent cannula was inserted into the mid-ascending aorta. A left ventricular vent was placed via the right superior pulmonary vein. A retrograde cardioplegia cannnula was placed into the coronary sinus via the right atrium. Aortic occlusion was performed with a single cross-clamp. Systemic cooling to 32 degrees Centigrade and topical cooling of the heart with iced saline were used. Hyperkalemic antegrade cold blood cardioplegia was used to induce diastolic arrest and then cold blood retrograde cardioplegia was given at about 20 minute intervals throughout the period of arrest to maintain myocardial temperature at or below 10 degrees centigrade. A temperature probe was inserted into the interventricular septum and an insulating pad was placed in the pericardium. Carbon dioxide was insufflated into the pericardium at 5L/min throughout the procedure to minimize intracardiac air.   Aortic Valve Replacement:   A  transverse aortotomy was performed 1 cm above the take-off of the right coronary artery. The native valve was type 1 bicuspid with severely calcified leaflets and severe annular calcification. There was a single raphe and 3 commissures.The ostia of the coronary arteries were in normal position and were not obstructed. The native valve leaflets were excised and the annulus was decalcified with rongeurs. Care was taken to remove all particulate debris. The left ventricle was directly inspected for debris and then irrigated with ice saline solution. The annulus was sized and a size 23 mm Edwards Magna-Ease pericardial valve was chosen. The model number was [id number] and the serial number was [id number]. While the valve was being prepared 2-0 Ethibond pledgeted horizontal mattress sutures were placed around the annulus with the pledgets in a sub-annular position. The sutures were placed through the sewing ring and the valve lowered into place. The sutures were tied sequentially. The valve seated nicely and the coronary ostia were not obstructed. The prosthetic valve leaflets moved normally and there was no sub-valvular obstruction. The aortotomy was closed using 4-0 Prolene suture in 2 layers with felt strips to reinforce the closure.  Completion:  The patient was rewarmed to 37 degrees Centigrade. De-airing maneuvers were performed and the head placed in trendelenburg position. The crossclamp was removed with a time of 97 minutes. There was spontaneous return of sinus rhythm. The aortotomy was checked for hemostasis. Two temporary epicardial pacing wires were placed on the right atrium and two on the right ventricle. The left ventricular vent and retrograde cardioplegia cannulas were removed. The patient was weaned from CPB without difficulty on no inotropes. CPB time was 123 minutes. Cardiac output was 6 LPM. Heparin was fully reversed with protamine and the aortic  and venous cannulas removed. Hemostasis was achieved.  Mediastinal drainage tubes were placed. The sternum was closed with double #6 stainless steel wires. The fascia was closed with continuous # 1 vicryl suture. The subcutaneous tissue was closed with 2-0 vicryl continuous suture. The skin was closed with 3-0 vicryl subcuticular suture. All sponge, needle, and instrument counts were reported correct at the end of the case. Dry sterile dressings were placed over the incisions and around the chest tubes which were connected to pleurevac suction. The patient was then transported to the surgical intensive care unit in critical but stable condition.

## 2017-05-18 NOTE — Anesthesia Procedure Notes (Signed)
Arterial Line Insertion Start/End1/07/2017 7:00 AM, 05/18/2017 7:10 AM Performed by: Verdie Drown, CRNA, CRNA  Patient location: OOR procedure area. Preanesthetic checklist: patient identified, IV checked, site marked, risks and benefits discussed, surgical consent, monitors and equipment checked, pre-op evaluation, timeout performed and anesthesia consent Lidocaine 1% used for infiltration Left, radial was placed Catheter size: 20 G Hand hygiene performed , maximum sterile barriers used  and Seldinger technique used Allen's test indicative of satisfactory collateral circulation Attempts: 1 Procedure performed without using ultrasound guided technique. Following insertion, Biopatch and dressing applied. Post procedure assessment: unchanged  Patient tolerated the procedure well with no immediate complications.

## 2017-05-18 NOTE — OR Nursing (Signed)
1st call to SICU @ 1133. 2nd call 1210.

## 2017-05-18 NOTE — Interval H&P Note (Signed)
History and Physical Interval Note:  05/18/2017 5:43 AM  Jeremy Larson  has presented today for surgery, with the diagnosis of Severe Aortic Stenosis  The various methods of treatment have been discussed with the patient and family. After consideration of risks, benefits and other options for treatment, the patient has consented to  Procedure(s): AORTIC VALVE REPLACEMENT (AVR),TEE (N/A) TRANSESOPHAGEAL ECHOCARDIOGRAM (TEE) (N/A) as a surgical intervention .  The patient's history has been reviewed, patient examined, no change in status, stable for surgery.  I have reviewed the patient's chart and labs.  Questions were answered to the patient's satisfaction.     Gaye Pollack

## 2017-05-18 NOTE — Transfer of Care (Signed)
Immediate Anesthesia Transfer of Care Note  Patient: Jeremy Larson  Procedure(s) Performed: AORTIC VALVE REPLACEMENT (AVR),TEE (N/A Chest) TRANSESOPHAGEAL ECHOCARDIOGRAM (TEE) (N/A )  Patient Location: SICU  Anesthesia Type:General  Level of Consciousness: sedated and Patient remains intubated per anesthesia plan  Airway & Oxygen Therapy: Patient remains intubated per anesthesia plan and Patient placed on Ventilator (see vital sign flow sheet for setting)  Post-op Assessment: Report given to RN and Post -op Vital signs reviewed and stable  Post vital signs: Reviewed and stable  Last Vitals:  Vitals:   05/18/17 0559 05/18/17 1250  BP: (!) 145/81 (!) 116/52  Pulse: 72 99  Resp: 20 12  Temp: 36.6 C   SpO2: 100% 100%    Last Pain:  Vitals:   05/18/17 0559  TempSrc: Oral      Patients Stated Pain Goal: 1 (78/24/23 5361)  Complications: No apparent anesthesia complications

## 2017-05-18 NOTE — Brief Op Note (Signed)
05/18/2017  8:13 AM  PATIENT:  [name]  67 y.o. male  PRE-OPERATIVE DIAGNOSIS:  Severe Aortic Stenosis  POST-OPERATIVE DIAGNOSIS:  * No post-op diagnosis entered *  PROCEDURE:  Procedure(s):  AORTIC VALVE REPLACEMENT -23 mm Edwards Magna Ease Pericardial Tissue Valve  TRANSESOPHAGEAL ECHOCARDIOGRAM (TEE) (N/A)  SURGEON:  Surgeon(s) and Role:    * Bartle, Fernande Boyden, MD - Primary  PHYSICIAN ASSISTANT: Ellwood Handler PA-C  ANESTHESIA:   general  EBL:  Minimal  BLOOD ADMINISTERED: CELLSAVER  DRAINS: Mediastinal Chest Drains   LOCAL MEDICATIONS USED:  NONE  SPECIMEN:  Source of Specimen:  Aortic Valve Leaflets  DISPOSITION OF SPECIMEN:  PATHOLOGY  COUNTS:  YES  TOURNIQUET:  * No tourniquets in log *  DICTATION: .Dragon Dictation  PLAN OF CARE: Admit to inpatient   PATIENT DISPOSITION:  ICU - intubated and hemodynamically stable.   Delay start of Pharmacological VTE agent (>24hrs) due to surgical blood loss or risk of bleeding: yes

## 2017-05-18 NOTE — Plan of Care (Signed)
  Not Progressing Respiratory: Respiratory status will improve 05/18/2017 2023 - Not Progressing by Netta Corrigan, RN Note Attempted to wean patient twice. Both times pH was too low and CO2 was too high. Will attempt wean again soon.

## 2017-05-18 NOTE — Progress Notes (Signed)
      [address]       [place] [address]             [phone]      S/p AVR  Intubated, but waking up and following commands  BP (!) 85/60   Pulse (!) 103   Temp 99.3 F (37.4 C)   Resp (!) 9   Ht 5\' 9"  (1.753 m)   Wt 167 lb 11.2 oz (76.1 kg)   SpO2 100%   BMI 24.76 kg/m    Intake/Output Summary (Last 24 hours) at 05/18/2017 1807 Last data filed at 05/18/2017 1800 Gross per 24 hour  Intake 3679 ml  Output 2340 ml  Net 1339 ml   Failed 1st wean attempt due to high pCO2.  Will attempt to wean again  Remo Lipps C. Roxan Hockey, MD [facility] [phone]

## 2017-05-19 ENCOUNTER — Inpatient Hospital Stay (HOSPITAL_COMMUNITY): Payer: Medicare Other

## 2017-05-19 ENCOUNTER — Other Ambulatory Visit: Payer: Self-pay

## 2017-05-19 ENCOUNTER — Encounter (HOSPITAL_COMMUNITY): Payer: Self-pay | Admitting: Surgery

## 2017-05-19 LAB — POCT I-STAT 3, ART BLOOD GAS (G3+)
Acid-base deficit: 2 mmol/L (ref 0.0–2.0)
Acid-base deficit: 2 mmol/L (ref 0.0–2.0)
Bicarbonate: 23.7 mmol/L (ref 20.0–28.0)
Bicarbonate: 23.7 mmol/L (ref 20.0–28.0)
O2 SAT: 96 %
O2 SAT: 96 %
PCO2 ART: 48.6 mmHg — AB (ref 32.0–48.0)
PCO2 ART: 47.8 mmHg (ref 32.0–48.0)
PH ART: 7.302 — AB (ref 7.350–7.450)
PO2 ART: 93 mmHg (ref 83.0–108.0)
Patient temperature: 38.3
Patient temperature: 37.8
TCO2: 25 mmol/L (ref 22–32)
TCO2: 25 mmol/L (ref 22–32)
pH, Arterial: 7.308 — ABNORMAL LOW (ref 7.350–7.450)
pO2, Arterial: 91 mmHg (ref 83.0–108.0)

## 2017-05-19 LAB — BASIC METABOLIC PANEL
Anion gap: 5 (ref 5–15)
BUN: 11 mg/dL (ref 6–20)
CALCIUM: 7.9 mg/dL — AB (ref 8.9–10.3)
CO2: 25 mmol/L (ref 22–32)
CREATININE: 1.26 mg/dL — AB (ref 0.61–1.24)
Chloride: 108 mmol/L (ref 101–111)
GFR calc Af Amer: >60 mL/min (ref >60)
GFR, EST NON AFRICAN AMERICAN: 58 mL/min — AB (ref >60)
GLUCOSE: 121 mg/dL — AB (ref 65–99)
Potassium: 4.6 mmol/L (ref 3.5–5.1)
Sodium: 138 mmol/L (ref 135–145)

## 2017-05-19 LAB — MAGNESIUM
MAGNESIUM: 2.5 mg/dL — AB (ref 1.7–2.4)
Magnesium: 2.8 mg/dL — ABNORMAL HIGH (ref 1.7–2.4)

## 2017-05-19 LAB — POCT I-STAT, CHEM 8
BUN: 10 mg/dL (ref 6–20)
BUN: 13 mg/dL (ref 6–20)
CALCIUM ION: 1.15 mmol/L (ref 1.15–1.40)
CHLORIDE: 98 mmol/L — AB (ref 101–111)
CREATININE: 0.9 mg/dL (ref 0.61–1.24)
CREATININE: 1.3 mg/dL — AB (ref 0.61–1.24)
Calcium, Ion: 1.07 mmol/L — ABNORMAL LOW (ref 1.15–1.40)
Chloride: 105 mmol/L (ref 101–111)
Glucose, Bld: 125 mg/dL — ABNORMAL HIGH (ref 65–99)
Glucose, Bld: 111 mg/dL — ABNORMAL HIGH (ref 65–99)
HCT: 28 % — ABNORMAL LOW (ref 39.0–52.0)
HEMATOCRIT: 32 % — AB (ref 39.0–52.0)
HEMOGLOBIN: 10.9 g/dL — AB (ref 13.0–17.0)
HEMOGLOBIN: 9.5 g/dL — AB (ref 13.0–17.0)
POTASSIUM: 4.6 mmol/L (ref 3.5–5.1)
Potassium: 6.5 mmol/L (ref 3.5–5.1)
SODIUM: 137 mmol/L (ref 135–145)
Sodium: 136 mmol/L (ref 135–145)
TCO2: 25 mmol/L (ref 22–32)
TCO2: 27 mmol/L (ref 22–32)

## 2017-05-19 LAB — CBC
HCT: 31.1 % — ABNORMAL LOW (ref 39.0–52.0)
HEMATOCRIT: 30.1 % — AB (ref 39.0–52.0)
Hemoglobin: 10.4 g/dL — ABNORMAL LOW (ref 13.0–17.0)
Hemoglobin: 9.8 g/dL — ABNORMAL LOW (ref 13.0–17.0)
MCH: 31.1 pg (ref 26.0–34.0)
MCH: 30.6 pg (ref 26.0–34.0)
MCHC: 33.4 g/dL (ref 30.0–36.0)
MCHC: 32.6 g/dL (ref 30.0–36.0)
MCV: 93.1 fL (ref 78.0–100.0)
MCV: 94.1 fL (ref 78.0–100.0)
PLATELETS: 132 10*3/uL — AB (ref 150–400)
Platelets: 125 10*3/uL — ABNORMAL LOW (ref 150–400)
RBC: 3.34 MIL/uL — ABNORMAL LOW (ref 4.22–5.81)
RBC: 3.2 MIL/uL — ABNORMAL LOW (ref 4.22–5.81)
RDW: 13.1 % (ref 11.5–15.5)
RDW: 13.1 % (ref 11.5–15.5)
WBC: 8.7 10*3/uL (ref 4.0–10.5)
WBC: 9.8 10*3/uL (ref 4.0–10.5)

## 2017-05-19 LAB — GLUCOSE, CAPILLARY
GLUCOSE-CAPILLARY: 107 mg/dL — AB (ref 65–99)
GLUCOSE-CAPILLARY: 102 mg/dL — AB (ref 65–99)
GLUCOSE-CAPILLARY: 113 mg/dL — AB (ref 65–99)

## 2017-05-19 LAB — CREATININE, SERUM
CREATININE: 1.4 mg/dL — AB (ref 0.61–1.24)
GFR calc Af Amer: 59 mL/min — ABNORMAL LOW (ref >60)
GFR, EST NON AFRICAN AMERICAN: 51 mL/min — AB (ref >60)

## 2017-05-19 MED ORDER — METOPROLOL TARTRATE 25 MG PO TABS
25.0000 mg | ORAL_TABLET | Freq: Two times a day (BID) | ORAL | Status: DC
  Administered 2017-05-19 – 2017-05-21 (×6): 25 mg via ORAL
  Filled 2017-05-19 (×6): qty 1

## 2017-05-19 MED ORDER — ORAL CARE MOUTH RINSE: 15.0000 mL | Freq: Two times a day (BID) | OROMUCOSAL | Status: DC

## 2017-05-19 MED ORDER — ENOXAPARIN SODIUM 30 MG/0.3ML ~~LOC~~ SOLN
30.0000 mg | Freq: Every day | SUBCUTANEOUS | Status: DC
  Administered 2017-05-19 – 2017-05-20 (×2): 30 mg via SUBCUTANEOUS
  Filled 2017-05-19 (×2): qty 0.3

## 2017-05-19 MED ORDER — LACTATED RINGERS IV SOLN
INTRAVENOUS | Status: DC
Start: 2017-05-19 — End: 2017-05-22

## 2017-05-19 MED ORDER — METOPROLOL TARTRATE 25 MG/10 ML ORAL SUSPENSION: 25.0000 mg | Freq: Two times a day (BID) | ORAL | Status: DC

## 2017-05-19 MED ORDER — FUROSEMIDE 10 MG/ML IJ SOLN
40.0000 mg | Freq: Once | INTRAMUSCULAR | Status: AC
  Administered 2017-05-19: 40 mg via INTRAVENOUS
  Filled 2017-05-19: qty 4

## 2017-05-19 MED FILL — Heparin Sodium (Porcine) Inj 1000 Unit/ML: INTRAMUSCULAR | Qty: 2500 | Status: AC

## 2017-05-19 MED FILL — Magnesium Sulfate Inj 50%: INTRAMUSCULAR | Qty: 10 | Status: AC

## 2017-05-19 MED FILL — Potassium Chloride Inj 2 mEq/ML: INTRAVENOUS | Qty: 40 | Status: AC

## 2017-05-19 MED FILL — Heparin Sodium (Porcine) Inj 1000 Unit/ML: INTRAMUSCULAR | Qty: 30 | Status: AC

## 2017-05-19 NOTE — Progress Notes (Signed)
1 Day Post-Op Procedure(s) (LRB): AORTIC VALVE REPLACEMENT (AVR),TEE (N/A) TRANSESOPHAGEAL ECHOCARDIOGRAM (TEE) (N/A) Subjective: Sore.  Did not get extubated till 2 am due to sleepiness.  Objective: Vital signs in last 24 hours: Temp:  [96.1 F (35.6 C)-101.1 F (38.4 C)] 99.3 F (37.4 C) (01/04 0800) Pulse Rate:  [85-110] 102 (01/04 0800) Cardiac Rhythm: Normal sinus rhythm (01/04 0745) Resp:  [9-30] 16 (01/04 0800) BP: (85-154)/(46-85) 140/67 (01/04 0800) SpO2:  [96 %-100 %] 96 % (01/04 0800) Arterial Line BP: (89-174)/(32-82) 124/56 (01/04 0800) FiO2 (%):  [40 %-100 %] 40 % (01/04 0122) Weight:  [78.4 kg (172 lb 13.5 oz)] 78.4 kg (172 lb 13.5 oz) (01/04 0500)  Hemodynamic parameters for last 24 hours: PAP: (17-49)/(9-29) 44/26 CO:  [5.7 L/min-6.9 L/min] 5.9 L/min CI:  [2 L/min/m2-3.7 L/min/m2] 3.2 L/min/m2  Intake/Output from previous day: 01/03 0701 - 01/04 0700 In: 4942.5 [P.O.:60; I.V.:3647.5; Blood:375; IV Piggyback:850] Out: 2330 [Urine:2985; Chest Tube:590] Intake/Output this shift: Total I/O In: 110 [I.V.:60; IV Piggyback:50] Out: 40 [Urine:40]  General appearance: alert and cooperative Neurologic: intact Heart: regular rate and rhythm, S1, S2 normal, no murmur, click, rub or gallop Lungs: clear to auscultation bilaterally Extremities: edema mild Wound: dressing dry  Lab Results: Recent Labs    05/18/17 1845 05/19/17 0330  WBC 7.4 8.7  HGB 10.7* 10.4*  HCT 32.6* 31.1*  PLT 139* 132*   BMET:  Recent Labs    05/18/17 1844 05/18/17 1845 05/19/17 0330  NA 139  --  138  K 5.8*  --  4.6  CL 106  --  108  CO2  --   --  25  GLUCOSE 115*  --  121*  BUN 11  --  11  CREATININE 1.20 1.24 1.26*  CALCIUM  --   --  7.9*    PT/INR:  Recent Labs    05/18/17 1248  LABPROT 17.3*  INR 1.43   ABG    Component Value Date/Time   PHART 7.308 (L) 05/19/2017 0329   HCO3 23.7 05/19/2017 0329   TCO2 25 05/19/2017 0329   ACIDBASEDEF 2.0 05/19/2017 0329    O2SAT 96.0 05/19/2017 0329   CBG (last 3)  Recent Labs    05/18/17 1600 05/18/17 1705 05/19/17 0736  GLUCAP 72 111* 102*   CXR: ok  ECG: sinus, nonspecific ST-T changes inferolaterally.  Assessment/Plan: S/P Procedure(s) (LRB): AORTIC VALVE REPLACEMENT (AVR),TEE (N/A) TRANSESOPHAGEAL ECHOCARDIOGRAM (TEE) (N/A)  Hemodynamically stable POD 1 Continue Lopressor DC chest tubes, swan and arterial line. Diurese. IS, mobilize.   LOS: 1 day    Gaye Pollack 05/19/2017

## 2017-05-19 NOTE — Progress Notes (Signed)
Pt now CPAP/PS per protocol by respiratory. Will draw ABG in 20 minutes.

## 2017-05-19 NOTE — Plan of Care (Signed)
  Progressing Clinical Measurements: Ability to maintain clinical measurements within normal limits will improve 05/19/2017 1021 - Progressing by Boris Lown, RN Diagnostic test results will improve 05/19/2017 1021 - Progressing by Boris Lown, RN Respiratory complications will improve 05/19/2017 1021 - Progressing by Boris Lown, RN Cardiovascular complication will be avoided 05/19/2017 1021 - Progressing by Boris Lown, RN Activity: Risk for activity intolerance will decrease 05/19/2017 1021 - Progressing by Boris Lown, RN Note Up to chair this morning. Pain Managment: General experience of comfort will improve 05/19/2017 1021 - Progressing by Boris Lown, RN

## 2017-05-19 NOTE — Discharge Summary (Signed)
Physician Discharge Summary  Patient ID: Jeremy Larson MRN: [id number] DOB/AGE: Oct 02, 1950 67 y.o.  Admit date: 05/18/2017 Discharge date: 05/23/2017  Admission Diagnoses:  Patient Active Problem List   Diagnosis Date Noted  . Severe aortic valve stenosis   . ESSENTIAL HYPERTENSION, BENIGN 01/25/2010  . AORTIC VALVE DISORDERS 01/25/2010  . DIZZINESS AND GIDDINESS 01/25/2010   Discharge Diagnoses:   Patient Active Problem List   Diagnosis Date Noted  . S/P AVR (aortic valve replacement) 05/18/2017  . Severe aortic valve stenosis   . ESSENTIAL HYPERTENSION, BENIGN 01/25/2010  . AORTIC VALVE DISORDERS 01/25/2010  . DIZZINESS AND GIDDINESS 01/25/2010   Discharged Condition: Stable and discharge to home.  History of Present Illness:  Mr. Jeremy Larson is a 67 yo male with long standing history of a heart murmur and known bicuspid valve.  This was originally diagnosed in 2011, at which point he was evaluated by Dr. Percival Spanish.  Exercise tolerance test was obtained and was unremarkable.  It was felt he could be monitored with serial echocardiograms.  However, the patient did not return for follow up.  In October the patient was again referred for cardiac evaluation by his PCP.  He was evaluated by Dr. Harl Bowie at which time the patient admitted to having more shortness of breath with moderate exertion.  He was also experiencing some associated chest tightness and fatigue.  He denied dizziness and syncope.  Repeat Echocardiogram was obtained on 04/03/2017 which showed severe aortic stenosis with annular thickening and calcification, a reduction in his EF to 35-40%, diffuse hypokinesis and grade 1 diastolic dysfunction.  He underwent cardiac catheterization by Dr. Angelena Form on 04/12/2017 which showed non-obstructive CAD.  It was felt surgical intervention would be indicated and he was referred to [facility].  He was evaluated by Dr. Cyndia Bent who was in agreement the patient would require surgical intervention.  His  CTA of his chest was reviewed and his aortic root was not enlarged and would not require replacement.  He was given the risks and benefits of Aortic Valve Replacement and he was agreeable to proceed.    Hospital Course:   Mr. Jeremy Larson presented to [facility] on 05/18/2016.  He was taken to the operating room and underwent Aortic Valve Replacment with a 23 Edwards Magna Ease Pericardial Tissue Valve.  He tolerated the procedure without difficulty and was taken to the SICU in stable condition.  During his stay in the SICU, the patient was weaned and extubated on the second attempt. The patient was extubated the evening of surgery without difficulty. He remained afebrile and hemodynamically stable. [name], a line, chest tubes, and foley were removed early in the post operative course. Lopressor was started and titrated accordingly. He was volume over loaded and diuresed. He had ABL anemia. He did not require a post op transfusion. Last H and H was 9.4 and 29. He was weaned off the insulin drip.   The patient's glucose remained well controlled.The patient's HGA1C pre op was 5.7. 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 The patient was felt surgically stable for transfer from the ICU to PCTU for the last few days;however, there was not a bed available. He remains in the ICU. He continues to progress with cardiac rehab. He was ambulating on room air. He has been tolerating a diet and has had a bowel movement. Epicardial pacing wires were removed on 05/23/2017. Chest tube sutures will be removed in the office after discharge. The patient  is felt surgically stable for discharge today.    Significant Diagnostic Studies:  CLINICAL DATA:  Postop  EXAM: CHEST  2 VIEW  COMPARISON:  05/20/2017  FINDINGS: Small left pleural effusion.  No pneumothorax.  The heart is normal in size.  Prosthetic aortic valve.  Median sternotomy.  Prior right IJ venous sheath has  been removed.  IMPRESSION: Small left pleural effusion.  No pneumothorax.   Electronically Signed   By: Julian Hy M.D.   On: 05/21/2017 09:53  Echocardiogram: Study Conclusions  - Left ventricle: The cavity size was normal. Wall thickness was   increased in a pattern of moderate LVH. Systolic function was   moderately reduced. The estimated ejection fraction was in the   range of 35% to 40%. Diffuse hypokinesis. Doppler parameters are   consistent with abnormal left ventricular relaxation (grade 1   diastolic dysfunction). Doppler parameters are consistent with   high ventricular filling pressure. - Aortic valve: Severely calcified annulus. Severely thickened   leaflets. There was severe stenosis. Mean gradient (S): 51 mm Hg.   VTI ratio of LVOT to aortic valve: 0.11. Valve area (VTI): 0.39   cm^2. Valve area (Vmax): 0.4 cm^2. Valve area (Vmean): 0.43 cm^2. - Left atrium: The atrium was mildly dilated. - Technically adequate study.  Treatments: surgery:    Procedure:  1. Median Sternotomy 2. Extracorporeal circulation 3.   Aortic valve replacement using a 23 mm Edwards Magna-Ease Pericardial valve by Dr. Cyndia Bent on 05/18/2017.   Discharge Medications:   Discharge Instructions    Amb Referral to Cardiac Rehabilitation   Complete by:  As directed    Diagnosis:  Valve Replacement   Valve:  Aortic     Allergies as of 05/23/2017   No Known Allergies     Medication List    STOP taking these medications   aspirin 81 MG tablet Replaced by:  aspirin 325 MG EC tablet     TAKE these medications   acetaminophen 325 MG tablet Commonly known as:  TYLENOL Take 2 tablets (650 mg total) by mouth every 6 (six) hours as needed for mild pain.   amiodarone 400 MG tablet Commonly known as:  PACERONE Take 0.5 tablets (200 mg total) by mouth 2 (two) times daily. For one week then take Amiodarone 200 mg by mouth daily thereafter.   aspirin 325 MG EC tablet Take 1  tablet (325 mg total) by mouth daily. Replaces:  aspirin 81 MG tablet   lisinopril 2.5 MG tablet Commonly known as:  PRINIVIL,ZESTRIL Take 1 tablet (2.5 mg total) daily by mouth.   loratadine 10 MG tablet Commonly known as:  CLARITIN Take 10 mg daily by mouth.   metoprolol succinate 100 MG 24 hr tablet Commonly known as:  TOPROL XL Take 1 tablet (100 mg total) by mouth daily. What changed:    medication strength  how much to take   traMADol 50 MG tablet Commonly known as:  ULTRAM Take 50 mg by mouth every 4-6 hours PRN moderate to severe pain.      The patient has been discharged on:   1.Beta Blocker:  Yes [ x  ]                              No   [   ]  If No, reason:  2.Ace Inhibitor/ARB: Yes [ x  ]                                     No  [    ]                                     If No, reason:  3.Statin:   Yes [   ]                  No  [ x  ]                  If No, reason:No CAD  4.Shela CommonsVelta Addison  [ x  ]                  No   [   ]                  If No, reason:  Follow-up Information    Gaye Pollack, MD. Go on 06/21/2017.   Specialty:  Cardiothoracic Surgery Why:  PA/LAT CXR to be taken (at [facility] which is in the same building as Dr. Vivi Martens office) on 06/21/2017 at 9:00 am;Appointment time is at 9:30 am Contact information: [place] [address] [phone]        Arnoldo Lenis, MD. Go on 06/23/2017.   Specialty:  Cardiology Why:  Appointment itme is at 2:00 pm Contact information: [place] [address] [phone]        Caryl Bis, MD. Call.   Specialty:  Family Medicine Why:  for a follow up appointment regarding further surveillance of HGA1C 5.7 (pre diabetes) Contact information: [address] [place] [address] [phone]        Nurse. Go on 05/30/2017.   Why:  Appointment is with nurse for chest tube suture removal only.  Office will call with appointment time. Contact information: [place] [address] [phone]          Signed: Arnoldo Lenis 05/23/2017, 8:14 AM

## 2017-05-19 NOTE — Progress Notes (Signed)
Rapid wean protocol started. Respiratory therapist switched pt to 40/4.

## 2017-05-19 NOTE — Progress Notes (Signed)
Respiratory therapy notified of readiness to wean.

## 2017-05-19 NOTE — Progress Notes (Signed)
Respiratory notified again of pt's readiness to wean.

## 2017-05-19 NOTE — Progress Notes (Signed)
Patient ID: Jeremy Larson, male   DOB: [birth year], 67 y.o.   MRN: [id number] EVENING ROUNDS NOTE :     [address]       [place] [address]             [phone]                 1 Day Post-Op Procedure(s) (LRB): AORTIC VALVE REPLACEMENT (AVR),TEE (N/A) TRANSESOPHAGEAL ECHOCARDIOGRAM (TEE) (N/A)  Total Length of Stay:  LOS: 1 day  BP 121/70   Pulse 96   Temp 99.4 F (37.4 C) (Oral)   Resp 16   Ht 5\' 9"  (1.753 m)   Wt 172 lb 13.5 oz (78.4 kg)   SpO2 94%   BMI 25.52 kg/m   .Intake/Output      01/03 0701 - 01/04 0700 01/04 0701 - 01/05 0700   P.O. 60 610   I.V. (mL/kg) 3647.5 (46.5) 240 (3.1)   Blood 375    Other 10    IV Piggyback 850 50   Total Intake(mL/kg) 4942.5 (63) 900 (11.5)   Urine (mL/kg/hr) 2985 (1.6) 1245 (1.5)   Chest Tube 590 0   Total Output 3575 1245   Net +1367.5 -345          . sodium chloride Stopped (05/19/17 0900)  . sodium chloride    . sodium chloride Stopped (05/19/17 0900)  . cefUROXime (ZINACEF)  IV Stopped (05/19/17 0806)  . lactated ringers    . lactated ringers Stopped (05/18/17 2000)  . lactated ringers 20 mL/hr at 05/19/17 0745  . lactated ringers 20 mL/hr at 05/19/17 1200  . nitroGLYCERIN Stopped (05/18/17 1529)  . phenylephrine (NEO-SYNEPHRINE) Adult infusion Stopped (05/19/17 0434)     Lab Results  Component Value Date   WBC 9.8 05/19/2017   HGB 9.5 (L) 05/19/2017   HCT 28.0 (L) 05/19/2017   PLT 125 (L) 05/19/2017   GLUCOSE 111 (H) 05/19/2017   ALT 16 (L) 05/15/2017   AST 19 05/15/2017   NA 136 05/19/2017   K 4.6 05/19/2017   CL 98 (L) 05/19/2017   CREATININE 1.30 (H) 05/19/2017   BUN 13 05/19/2017   CO2 25 05/19/2017   INR 1.43 05/18/2017   HGBA1C 5.7 (H) 05/15/2017   Stable day Ambulating  Cr 1.3    Grace Isaac MD  Beeper [phone] Office [phone] 05/19/2017 5:37 PM

## 2017-05-19 NOTE — Procedures (Signed)
Extubation Procedure Note  Patient Details:   Name: Jeremy Larson DOB: 11/29/1950 MRN: [id number]   Airway Documentation:     Evaluation  O2 sats: stable throughout Complications: No apparent complications Patient did tolerate procedure well. Bilateral Breath Sounds: Clear, Diminished   Yes   Patient with positive cuff leak prior to extubation.  Weaning parameters as follows:  NIF-35 VC 774ml    Post extubation patient was able to achieve 576ml on IS.    [name] [name] 05/19/2017, 2:52 AM

## 2017-05-20 ENCOUNTER — Inpatient Hospital Stay (HOSPITAL_COMMUNITY): Payer: Medicare Other

## 2017-05-20 LAB — CBC
HCT: 28.6 % — ABNORMAL LOW (ref 39.0–52.0)
HEMOGLOBIN: 9.4 g/dL — AB (ref 13.0–17.0)
MCH: 30.8 pg (ref 26.0–34.0)
MCHC: 32.9 g/dL (ref 30.0–36.0)
MCV: 93.8 fL (ref 78.0–100.0)
PLATELETS: 109 10*3/uL — AB (ref 150–400)
RBC: 3.05 MIL/uL — AB (ref 4.22–5.81)
RDW: 13.1 % (ref 11.5–15.5)
WBC: 9 10*3/uL (ref 4.0–10.5)

## 2017-05-20 LAB — URINALYSIS, ROUTINE W REFLEX MICROSCOPIC
Bilirubin Urine: NEGATIVE
Glucose, UA: NEGATIVE mg/dL
Ketones, ur: 5 mg/dL — AB
Nitrite: NEGATIVE
Protein, ur: 30 mg/dL — AB
Specific Gravity, Urine: 1.021 (ref 1.005–1.030)
pH: 7 (ref 5.0–8.0)

## 2017-05-20 LAB — BASIC METABOLIC PANEL
ANION GAP: 7 (ref 5–15)
BUN: 14 mg/dL (ref 6–20)
CALCIUM: 7.7 mg/dL — AB (ref 8.9–10.3)
CO2: 26 mmol/L (ref 22–32)
CREATININE: 1.19 mg/dL (ref 0.61–1.24)
Chloride: 101 mmol/L (ref 101–111)
Glucose, Bld: 104 mg/dL — ABNORMAL HIGH (ref 65–99)
Potassium: 4.1 mmol/L (ref 3.5–5.1)
SODIUM: 134 mmol/L — AB (ref 135–145)

## 2017-05-20 MED ORDER — ALPRAZOLAM 0.25 MG PO TABS
0.2500 mg | ORAL_TABLET | Freq: Two times a day (BID) | ORAL | Status: DC
  Administered 2017-05-20 – 2017-05-23 (×7): 0.25 mg via ORAL
  Filled 2017-05-20 (×7): qty 1

## 2017-05-20 MED ORDER — VITAMIN B-1 100 MG PO TABS
100.0000 mg | ORAL_TABLET | Freq: Every day | ORAL | Status: DC
  Administered 2017-05-20 – 2017-05-23 (×4): 100 mg via ORAL
  Filled 2017-05-20 (×4): qty 1

## 2017-05-20 MED ORDER — AMIODARONE IV BOLUS ONLY 150 MG/100ML
150.0000 mg | Freq: Once | INTRAVENOUS | Status: AC
  Administered 2017-05-20: 150 mg via INTRAVENOUS
  Filled 2017-05-20: qty 100

## 2017-05-20 MED ORDER — AMIODARONE HCL 200 MG PO TABS
400.0000 mg | ORAL_TABLET | Freq: Two times a day (BID) | ORAL | Status: DC
  Administered 2017-05-20 – 2017-05-23 (×6): 400 mg via ORAL
  Filled 2017-05-20 (×7): qty 2

## 2017-05-20 MED ORDER — THIAMINE HCL 100 MG/ML IJ SOLN
100.0000 mg | Freq: Every day | INTRAMUSCULAR | Status: DC
  Filled 2017-05-20: qty 2

## 2017-05-20 NOTE — Progress Notes (Signed)
Pt. Had two episodes of intermittent confusion with trying to get out of bed, after reorientation patient was okay.
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
## 2017-05-20 NOTE — Anesthesia Postprocedure Evaluation (Signed)
Anesthesia Post Note  Patient: Jeremy Larson  Procedure(s) Performed: AORTIC VALVE REPLACEMENT (AVR),TEE (N/A Chest) TRANSESOPHAGEAL ECHOCARDIOGRAM (TEE) (N/A )     Patient location during evaluation: SICU Anesthesia Type: General Level of consciousness: sedated, patient cooperative and confused Pain management: pain level controlled Vital Signs Assessment: post-procedure vital signs reviewed and stable Respiratory status: spontaneous breathing, nonlabored ventilation, respiratory function stable and patient connected to nasal cannula oxygen Cardiovascular status: blood pressure returned to baseline and stable Postop Assessment: no apparent nausea or vomiting Anesthetic complications: yes (delirium) Comments: Pt doing well from hemodynamic standpoint, but remains confused. ICU RNs and family working hard to reassure and orient patient.     Last Vitals:  Vitals:   05/20/17 1200 05/20/17 1215  BP: 101/67 116/66  Pulse: (!) 122 100  Resp: 20 18  Temp:    SpO2: 90% 94%    Last Pain:  Vitals:   05/20/17 1252  TempSrc:   PainSc: Asleep                 Gaege Sangalang,E. Selen Smucker

## 2017-05-20 NOTE — Progress Notes (Signed)
End of shift summary: Patient has slept well throughout the day. When he woke up he was disoriented. RN encouraged patient to go back to sleep and he did. When he woke up he would begin taking his 02 probe off, cardiac leads off, pull at F/C, and even took his old chest tube dressings off once. RN asked patient several times where he was, what month it was and patient responded, "I don't know" every time. Mittens applied to hands bilaterally around 1630 and safety sitter ordered for patients safety. Patient was more alert and oriented towards the end of the shift and was able to talk to Dr.Gerhardt about the football game on TV and knew today's date. The only pain medication administered was Tylenol and Ultram. Dr.Gerhardt concurred with holding strong narcotic pain medications if possible in case they were contributing to his confusion.

## 2017-05-20 NOTE — Progress Notes (Signed)
Patient ID: Jeremy Larson, male   DOB: June 22, 1950, 67 y.o.   MRN: [id number] [facility] DAILY ICU PROGRESS NOTE                   [address]            [place] [address]          [phone]   2 Days Post-Op Procedure(s) (LRB): AORTIC VALVE REPLACEMENT (AVR),TEE (N/A) TRANSESOPHAGEAL ECHOCARDIOGRAM (TEE) (N/A)  Total Length of Stay:  LOS: 2 days   Subjective: Patient awake alert, knows where he is and that the date is Saturday.  But is mildly agitated/frustrated about his family not coming to visit and needing to go home.  Objective: Vital signs in last 24 hours: Temp:  [98.8 F (37.1 C)-99.7 F (37.6 C)] 99.5 F (37.5 C) (01/05 0728) Pulse Rate:  [88-109] 106 (01/05 0700) Cardiac Rhythm: Normal sinus rhythm (01/05 0400) Resp:  [10-24] 16 (01/05 0700) BP: (92-153)/(59-101) 135/77 (01/05 0700) SpO2:  [84 %-100 %] 94 % (01/05 0700) Arterial Line BP: (124-147)/(56-60) 147/60 (01/04 0900) Weight:  [171 lb 15.3 oz (78 kg)] 171 lb 15.3 oz (78 kg) (01/05 0306)  Filed Weights   05/18/17 0559 05/19/17 0500 05/20/17 0306  Weight: 167 lb 11.2 oz (76.1 kg) 172 lb 13.5 oz (78.4 kg) 171 lb 15.3 oz (78 kg)    Weight change: -14.1 oz (-0.4 kg)   Hemodynamic parameters for last 24 hours: PAP: (44-46)/(20-26) 46/20 CO:  [5.9 L/min] 5.9 L/min CI:  [3.2 L/min/m2] 3.2 L/min/m2  Intake/Output from previous day: 01/04 0701 - 01/05 0700 In: 1150 [P.O.:830; I.V.:270; IV Piggyback:50] Out: 1860 [Urine:1860]  Intake/Output this shift: No intake/output data recorded.  Current Meds: Scheduled Meds: . acetaminophen  1,000 mg Oral Q6H   Or  . acetaminophen (TYLENOL) oral liquid 160 mg/5 mL  1,000 mg Per Tube Q6H  . aspirin EC  325 mg Oral Daily   Or  . aspirin  324 mg Per Tube Daily  . bisacodyl  10 mg Oral Daily   Or  . bisacodyl  10 mg Rectal Daily  . Chlorhexidine Gluconate Cloth  6 each Topical Daily  . docusate sodium  200 mg Oral Daily  . enoxaparin (LOVENOX)  injection  30 mg Subcutaneous QHS  . loratadine  10 mg Oral Daily  . metoprolol tartrate  25 mg Oral BID   Or  . metoprolol tartrate  25 mg Per Tube BID  . pantoprazole  40 mg Oral Daily  . sodium chloride flush  10-40 mL Intracatheter Q12H  . sodium chloride flush  3 mL Intravenous Q12H   Continuous Infusions: . sodium chloride Stopped (05/19/17 0900)  . sodium chloride    . sodium chloride Stopped (05/19/17 0900)  . cefUROXime (ZINACEF)  IV Stopped (05/19/17 2050)  . lactated ringers    . lactated ringers Stopped (05/18/17 2000)  . lactated ringers 20 mL/hr at 05/19/17 0745  . lactated ringers 20 mL/hr at 05/20/17 0600  . nitroGLYCERIN Stopped (05/18/17 1529)  . phenylephrine (NEO-SYNEPHRINE) Adult infusion Stopped (05/19/17 0434)   PRN Meds:.sodium chloride, lactated ringers, metoprolol tartrate, morphine injection, ondansetron (ZOFRAN) IV, oxyCODONE, sodium chloride flush, sodium chloride flush, traMADol  General appearance: alert and cooperative Neurologic: intact and Mildly agitated Heart: regular rate and rhythm, S1, S2 normal, no murmur, click, rub or gallop Lungs: clear to auscultation bilaterally Abdomen: soft, non-tender; bowel sounds normal; no masses,  no organomegaly Extremities: extremities normal, atraumatic, no cyanosis  or edema Wound: Sternum is stable dressing intact  Lab Results: CBC: Recent Labs    05/19/17 1558 05/19/17 1606 05/20/17 0249  WBC 9.8  --  9.0  HGB 9.8* 9.5* 9.4*  HCT 30.1* 28.0* 28.6*  PLT 125*  --  109*   BMET:  Recent Labs    05/19/17 0330  05/19/17 1606 05/20/17 0249  NA 138  --  136 134*  K 4.6  --  4.6 4.1  CL 108  --  98* 101  CO2 25  --   --  26  GLUCOSE 121*  --  111* 104*  BUN 11  --  13 14  CREATININE 1.26*   < > 1.30* 1.19  CALCIUM 7.9*  --   --  7.7*   < > = values in this interval not displayed.    CMET: Lab Results  Component Value Date   WBC 9.0 05/20/2017   HGB 9.4 (L) 05/20/2017   HCT 28.6 (L)  05/20/2017   PLT 109 (L) 05/20/2017   GLUCOSE 104 (H) 05/20/2017   ALT 16 (L) 05/15/2017   AST 19 05/15/2017   NA 134 (L) 05/20/2017   K 4.1 05/20/2017   CL 101 05/20/2017   CREATININE 1.19 05/20/2017   BUN 14 05/20/2017   CO2 26 05/20/2017   INR 1.43 05/18/2017   HGBA1C 5.7 (H) 05/15/2017      PT/INR:  Recent Labs    05/18/17 1248  LABPROT 17.3*  INR 1.43   Radiology: Dg Chest Port 1 View  Result Date: 05/20/2017 CLINICAL DATA:  Status post aortic valve replacement. EXAM: PORTABLE CHEST 1 VIEW COMPARISON:  05/19/2017 the prior radiograph FINDINGS: Cardiomegaly and aortic valve replacement noted. Swan-Ganz catheter and mediastinal/ thoracostomy tubes have been removed. A right IJ central venous catheter sheath remains. Slightly improved lung volumes with mild bibasilar atelectasis again noted. There is no evidence of pulmonary edema or poor pneumothorax. IMPRESSION: Slightly improved lung volumes with mild bibasilar atelectasis. Support apparatus removal as described. No evidence of pneumothorax. Electronically Signed   By: Margarette Canada M.D.   On: 05/20/2017 07:41     Assessment/Plan: S/P Procedure(s) (LRB): AORTIC VALVE REPLACEMENT (AVR),TEE (N/A) TRANSESOPHAGEAL ECHOCARDIOGRAM (TEE) (N/A) Mobilize d/c pacing wires Patient's family has been contacted and is coming to visit DC Foley and central line We will continue to monitor for possible withdrawal symptoms   Grace Isaac 05/20/2017 7:45 AM

## 2017-05-20 NOTE — Progress Notes (Signed)
[phone] Reviewed [facility] education with patient and patient's family including restrictions, IS use, sternal precautions, and risk factor modification. Heart healthy diet and exercise guidelines given.Reinforce activity progression prior to discharge. Pt verbalizes understanding of information given. Discussed phase 2 cardiac rehab and pt is interested in the program at [facility]. Will send referral. Sol Passer, MS, ACSM CEP

## 2017-05-21 ENCOUNTER — Inpatient Hospital Stay (HOSPITAL_COMMUNITY): Payer: Medicare Other

## 2017-05-21 LAB — BASIC METABOLIC PANEL
Anion gap: 5 (ref 5–15)
BUN: 12 mg/dL (ref 6–20)
CO2: 28 mmol/L (ref 22–32)
Calcium: 8.4 mg/dL — ABNORMAL LOW (ref 8.9–10.3)
Chloride: 100 mmol/L — ABNORMAL LOW (ref 101–111)
Creatinine, Ser: 0.99 mg/dL (ref 0.61–1.24)
GFR calc Af Amer: >60 mL/min (ref >60)
GFR calc non Af Amer: >60 mL/min (ref >60)
Glucose, Bld: 91 mg/dL (ref 65–99)
Potassium: 4.1 mmol/L (ref 3.5–5.1)
Sodium: 133 mmol/L — ABNORMAL LOW (ref 135–145)

## 2017-05-21 LAB — CBC
HCT: 29.5 % — ABNORMAL LOW (ref 39.0–52.0)
Hemoglobin: 9.4 g/dL — ABNORMAL LOW (ref 13.0–17.0)
MCH: 30 pg (ref 26.0–34.0)
MCHC: 31.9 g/dL (ref 30.0–36.0)
MCV: 94.2 fL (ref 78.0–100.0)
Platelets: 133 10*3/uL — ABNORMAL LOW (ref 150–400)
RBC: 3.13 MIL/uL — ABNORMAL LOW (ref 4.22–5.81)
RDW: 13.3 % (ref 11.5–15.5)
WBC: 6.8 10*3/uL (ref 4.0–10.5)

## 2017-05-21 LAB — URINE CULTURE
Culture: NO GROWTH
Special Requests: NORMAL

## 2017-05-21 MED ORDER — ENOXAPARIN SODIUM 40 MG/0.4ML ~~LOC~~ SOLN
40.0000 mg | Freq: Every day | SUBCUTANEOUS | Status: DC
  Administered 2017-05-21 – 2017-05-22 (×2): 40 mg via SUBCUTANEOUS
  Filled 2017-05-21 (×2): qty 0.4

## 2017-05-21 NOTE — Progress Notes (Signed)
Overall patient had a great night, no issues with confusion remained oriented all night.

## 2017-05-21 NOTE — Progress Notes (Signed)
Patient ID: Jeremy Larson, male   DOB: 17-Sep-1950, 67 y.o.   MRN: [id number] EVENING ROUNDS NOTE :     [address]       [place] [address]             [phone]                 3 Days Post-Op Procedure(s) (LRB): AORTIC VALVE REPLACEMENT (AVR),TEE (N/A) TRANSESOPHAGEAL ECHOCARDIOGRAM (TEE) (N/A)  Total Length of Stay:  LOS: 3 days  BP (!) 155/87   Pulse (!) 109   Temp 99 F (37.2 C) (Oral)   Resp (!) 23   Ht 5\' 9"  (1.753 m)   Wt 168 lb 10.4 oz (76.5 kg)   SpO2 95%   BMI 24.91 kg/m   .Intake/Output      01/05 0701 - 01/06 0700 01/06 0701 - 01/07 0700   P.O. 720 640   I.V. (mL/kg) 280 (3.7)    IV Piggyback     Total Intake(mL/kg) 1000 (13.1) 640 (8.4)   Urine (mL/kg/hr) 1950 (1.1) 1350 (1.5)   Stool 1 2   Chest Tube     Total Output 1951 1352   Net -951 -712          . lactated ringers    . lactated ringers 20 mL/hr at 05/20/17 0600  . nitroGLYCERIN Stopped (05/18/17 1529)  . phenylephrine (NEO-SYNEPHRINE) Adult infusion Stopped (05/19/17 0434)     Lab Results  Component Value Date   WBC 6.8 05/21/2017   HGB 9.4 (L) 05/21/2017   HCT 29.5 (L) 05/21/2017   PLT 133 (L) 05/21/2017   GLUCOSE 91 05/21/2017   ALT 16 (L) 05/15/2017   AST 19 05/15/2017   NA 133 (L) 05/21/2017   K 4.1 05/21/2017   CL 100 (L) 05/21/2017   CREATININE 0.99 05/21/2017   BUN 12 05/21/2017   CO2 28 05/21/2017   INR 1.43 05/18/2017   HGBA1C 5.7 (H) 05/15/2017   Stable day Hr 105 sinus on lopressor 25 bid and Cordarone  Better day today    Grace Isaac MD  Beeper [phone] Office [phone] 05/21/2017 6:33 PM

## 2017-05-21 NOTE — Progress Notes (Signed)
Pt. Went  Into Atrial Fib w/ RVR.  Provider notified orders received for  Amiodarone Bolus, and P.O. As well.

## 2017-05-21 NOTE — Progress Notes (Signed)
Patient ID: Jeremy Larson, male   DOB: [birth year], 67 y.o.   MRN: [id number] TCTS DAILY ICU PROGRESS NOTE                   [address]            [place] [address]          [phone]   3 Days Post-Op Procedure(s) (LRB): AORTIC VALVE REPLACEMENT (AVR),TEE (N/A) TRANSESOPHAGEAL ECHOCARDIOGRAM (TEE) (N/A)  Total Length of Stay:  LOS: 3 days   Subjective: Less agitated this am, walked in unit , afib brief last night now on po [place]   Objective: Vital signs in last 24 hours: Temp:  [98.8 F (37.1 C)-99.7 F (37.6 C)] 99.7 F (37.6 C) (01/06 0729) Pulse Rate:  [90-122] 93 (01/06 0600) Cardiac Rhythm: Sinus tachycardia (01/06 0400) Resp:  [9-30] 14 (01/06 0600) BP: (88-153)/(49-121) 144/121 (01/06 0600) SpO2:  [88 %-100 %] 99 % (01/06 0600) Weight:  [168 lb 10.4 oz (76.5 kg)] 168 lb 10.4 oz (76.5 kg) (01/06 0500)  Filed Weights   05/19/17 0500 05/20/17 0306 05/21/17 0500  Weight: 172 lb 13.5 oz (78.4 kg) 171 lb 15.3 oz (78 kg) 168 lb 10.4 oz (76.5 kg)    Weight change: -4.9 oz (-1.5 kg)   Hemodynamic parameters for last 24 hours:    Intake/Output from previous day: 01/05 0701 - 01/06 0700 In: 1000 [P.O.:720; I.V.:280] Out: 1951 [CVELF:8101; Stool:1]  Intake/Output this shift: No intake/output data recorded.  Current Meds: Scheduled Meds: . acetaminophen  1,000 mg Oral Q6H   Or  . acetaminophen (TYLENOL) oral liquid 160 mg/5 mL  1,000 mg Per Tube Q6H  . ALPRAZolam  0.25 mg Oral BID  . amiodarone  400 mg Oral BID  . aspirin EC  325 mg Oral Daily   Or  . aspirin  324 mg Per Tube Daily  . bisacodyl  10 mg Oral Daily   Or  . bisacodyl  10 mg Rectal Daily  . Chlorhexidine Gluconate Cloth  6 each Topical Daily  . docusate sodium  200 mg Oral Daily  . enoxaparin (LOVENOX) injection  30 mg Subcutaneous QHS  . loratadine  10 mg Oral Daily  . metoprolol tartrate  25 mg Oral BID   Or  . metoprolol tartrate  25 mg Per Tube BID  .  pantoprazole  40 mg Oral Daily  . sodium chloride flush  10-40 mL Intracatheter Q12H  . sodium chloride flush  3 mL Intravenous Q12H  . thiamine  100 mg Oral Daily   Continuous Infusions: . lactated ringers    . lactated ringers 20 mL/hr at 05/20/17 0600  . nitroGLYCERIN Stopped (05/18/17 1529)  . phenylephrine (NEO-SYNEPHRINE) Adult infusion Stopped (05/19/17 0434)   PRN Meds:.lactated ringers, metoprolol tartrate, morphine injection, ondansetron (ZOFRAN) IV, oxyCODONE, sodium chloride flush, sodium chloride flush, traMADol  General appearance: alert and cooperative Neurologic: intact Heart: regular rate and rhythm, S1, S2 normal, no murmur, click, rub or gallop Lungs: clear to auscultation bilaterally Abdomen: soft, non-tender; bowel sounds normal; no masses,  no organomegaly Extremities: extremities normal, atraumatic, no cyanosis or edema and Homans sign is negative, no sign of DVT Wound: sternum stable   Lab Results: CBC: Recent Labs    05/20/17 0249 05/21/17 0252  WBC 9.0 6.8  HGB 9.4* 9.4*  HCT 28.6* 29.5*  PLT 109* 133*   BMET:  Recent Labs    05/20/17 0249 05/21/17 0252  NA 134* 133*  K 4.1 4.1  CL 101 100*  CO2 26 28  GLUCOSE 104* 91  BUN 14 12  CREATININE 1.19 0.99  CALCIUM 7.7* 8.4*    CMET: Lab Results  Component Value Date   WBC 6.8 05/21/2017   HGB 9.4 (L) 05/21/2017   HCT 29.5 (L) 05/21/2017   PLT 133 (L) 05/21/2017   GLUCOSE 91 05/21/2017   ALT 16 (L) 05/15/2017   AST 19 05/15/2017   NA 133 (L) 05/21/2017   K 4.1 05/21/2017   CL 100 (L) 05/21/2017   CREATININE 0.99 05/21/2017   BUN 12 05/21/2017   CO2 28 05/21/2017   INR 1.43 05/18/2017   HGBA1C 5.7 (H) 05/15/2017      PT/INR:  Recent Labs    05/18/17 1248  LABPROT 17.3*  INR 1.43   Radiology: No results found.   Assessment/Plan: S/P Procedure(s) (LRB): AORTIC VALVE REPLACEMENT (AVR),TEE (N/A) TRANSESOPHAGEAL ECHOCARDIOGRAM (TEE) (N/A) Mobilize Diuresis D/c  foley Mental status improved this am Monitor rhythm , sinus 105 now, brief afib, frequent pacs last pm I have seen and examined Jeremy Larson 05/21/2017 7:53 AM

## 2017-05-22 ENCOUNTER — Ambulatory Visit: Payer: Medicare Other | Admitting: Cardiology

## 2017-05-22 LAB — BASIC METABOLIC PANEL
Anion gap: 9 (ref 5–15)
BUN: 10 mg/dL (ref 6–20)
CO2: 27 mmol/L (ref 22–32)
Calcium: 8.3 mg/dL — ABNORMAL LOW (ref 8.9–10.3)
Chloride: 102 mmol/L (ref 101–111)
Creatinine, Ser: 1.03 mg/dL (ref 0.61–1.24)
GFR calc Af Amer: >60 mL/min (ref >60)
GFR calc non Af Amer: >60 mL/min (ref >60)
Glucose, Bld: 110 mg/dL — ABNORMAL HIGH (ref 65–99)
Potassium: 3.7 mmol/L (ref 3.5–5.1)
Sodium: 138 mmol/L (ref 135–145)

## 2017-05-22 LAB — CBC
HCT: 29 % — ABNORMAL LOW (ref 39.0–52.0)
Hemoglobin: 9.4 g/dL — ABNORMAL LOW (ref 13.0–17.0)
MCH: 30.3 pg (ref 26.0–34.0)
MCHC: 32.4 g/dL (ref 30.0–36.0)
MCV: 93.5 fL (ref 78.0–100.0)
Platelets: 198 10*3/uL (ref 150–400)
RBC: 3.1 MIL/uL — ABNORMAL LOW (ref 4.22–5.81)
RDW: 13.2 % (ref 11.5–15.5)
WBC: 6.2 10*3/uL (ref 4.0–10.5)

## 2017-05-22 LAB — GLUCOSE, CAPILLARY: GLUCOSE-CAPILLARY: 85 mg/dL (ref 65–99)

## 2017-05-22 MED ORDER — ASPIRIN EC 325 MG PO TBEC
325.0000 mg | DELAYED_RELEASE_TABLET | Freq: Every day | ORAL | Status: DC
  Administered 2017-05-23: 325 mg via ORAL
  Filled 2017-05-22: qty 1

## 2017-05-22 MED ORDER — ONDANSETRON HCL 4 MG PO TABS: 4.0000 mg | ORAL_TABLET | Freq: Four times a day (QID) | ORAL | Status: DC | PRN

## 2017-05-22 MED ORDER — OXYCODONE HCL 5 MG PO TABS: 5.0000 mg | ORAL_TABLET | ORAL | Status: DC | PRN

## 2017-05-22 MED ORDER — METOPROLOL TARTRATE 25 MG/10 ML ORAL SUSPENSION: 50.0000 mg | Freq: Two times a day (BID) | ORAL | Status: DC

## 2017-05-22 MED ORDER — ONDANSETRON HCL 4 MG/2ML IJ SOLN: 4.0000 mg | Freq: Four times a day (QID) | INTRAMUSCULAR | Status: DC | PRN

## 2017-05-22 MED ORDER — METOPROLOL TARTRATE 50 MG PO TABS
50.0000 mg | ORAL_TABLET | Freq: Two times a day (BID) | ORAL | Status: DC
  Administered 2017-05-22: 50 mg via ORAL
  Filled 2017-05-22: qty 1

## 2017-05-22 MED ORDER — ACETAMINOPHEN 325 MG PO TABS: 650.0000 mg | ORAL_TABLET | Freq: Four times a day (QID) | ORAL | Status: DC | PRN

## 2017-05-22 MED ORDER — SODIUM CHLORIDE 0.9% FLUSH: 3.0000 mL | INTRAVENOUS | Status: DC | PRN

## 2017-05-22 MED ORDER — TRAMADOL HCL 50 MG PO TABS: 50.0000 mg | ORAL_TABLET | Freq: Four times a day (QID) | ORAL | Status: DC | PRN

## 2017-05-22 MED ORDER — PANTOPRAZOLE SODIUM 40 MG PO TBEC
40.0000 mg | DELAYED_RELEASE_TABLET | Freq: Every day | ORAL | Status: DC
  Administered 2017-05-23: 40 mg via ORAL
  Filled 2017-05-22: qty 1

## 2017-05-22 MED ORDER — SODIUM CHLORIDE 0.9 % IV SOLN: 250.0000 mL | INTRAVENOUS | Status: DC | PRN

## 2017-05-22 MED ORDER — METOPROLOL TARTRATE 50 MG PO TABS
50.0000 mg | ORAL_TABLET | Freq: Two times a day (BID) | ORAL | Status: DC
  Administered 2017-05-22 – 2017-05-23 (×2): 50 mg via ORAL
  Filled 2017-05-22 (×2): qty 1

## 2017-05-22 MED ORDER — MOVING RIGHT ALONG BOOK
Freq: Once | Status: DC
  Filled 2017-05-22: qty 1

## 2017-05-22 MED ORDER — POTASSIUM CHLORIDE CRYS ER 20 MEQ PO TBCR
20.0000 meq | EXTENDED_RELEASE_TABLET | ORAL | Status: AC
  Administered 2017-05-22 (×3): 20 meq via ORAL
  Filled 2017-05-22 (×3): qty 1

## 2017-05-22 MED ORDER — SODIUM CHLORIDE 0.9% FLUSH
3.0000 mL | Freq: Two times a day (BID) | INTRAVENOUS | Status: DC
  Administered 2017-05-22: 10 mL via INTRAVENOUS

## 2017-05-22 NOTE — Discharge Instructions (Signed)
Preventing Type 2 Diabetes Mellitus Type 2 diabetes (type 2 diabetes mellitus) is a long-term (chronic) disease that affects blood sugar (glucose) levels. Normally, a hormone called insulin allows glucose to enter cells in the body. The cells use glucose for energy. In type 2 diabetes, one or both of these problems may be present:  The body does not make enough insulin.  The body does not respond properly to insulin that it makes (insulin resistance).  Insulin resistance or lack of insulin causes excess glucose to build up in the blood instead of going into cells. As a result, high blood glucose (hyperglycemia) develops, which can cause many complications. Being overweight or obese and having an inactive (sedentary) lifestyle can increase your risk for diabetes. Type 2 diabetes can be delayed or prevented by making certain nutrition and lifestyle changes. What nutrition changes can be made?  Eat healthy meals and snacks regularly. Keep a healthy snack with you for when you get hungry between meals, such as fruit or a handful of nuts.  Eat lean meats and proteins that are low in saturated fats, such as chicken, fish, egg whites, and beans. Avoid processed meats.  Eat plenty of fruits and vegetables and plenty of grains that have not been processed (whole grains). It is recommended that you eat: ? 1?2 cups of fruit every day. ? 2?3 cups of vegetables every day. ? 6?8 oz of whole grains every day, such as oats, whole wheat, bulgur, brown rice, quinoa, and millet.  Eat low-fat dairy products, such as milk, yogurt, and cheese.  Eat foods that contain healthy fats, such as nuts, avocado, olive oil, and canola oil.  Drink water throughout the day. Avoid drinks that contain added sugar, such as soda or sweet tea.  Follow instructions from your health care provider about specific eating or drinking restrictions.  Control how much food you eat at a time (portion size). ? Check food labels to find  out the serving sizes of foods. ? Use a kitchen scale to weigh amounts of foods.  Saute or steam food instead of frying it. Cook with water or broth instead of oils or butter.  Limit your intake of: ? Salt (sodium). Have no more than 1 tsp (2,400 mg) of sodium a day. If you have heart disease or high blood pressure, have less than ? tsp (1,500 mg) of sodium a day. ? Saturated fat. This is fat that is solid at room temperature, such as butter or fat on meat. What lifestyle changes can be made?  Activity  Do moderate-intensity physical activity for at least 30 minutes on at least 5 days of the week, or as much as told by your health care provider.  Ask your health care provider what activities are safe for you. A mix of physical activities may be best, such as walking, swimming, cycling, and strength training.  Try to add physical activity into your day. For example: ? Park in spots that are farther away than usual, so that you walk more. For example, park in a far corner of the parking lot when you go to the office or the grocery store. ? Take a walk during your lunch break. ? Use stairs instead of elevators or escalators. Weight Loss  Lose weight as directed. Your health care provider can determine how much weight loss is best for you and can help you lose weight safely.  If you are overweight or obese, you may be instructed to lose at least 5?7 %  of your body weight. Alcohol and Tobacco   Limit alcohol intake to no more than 1 drink a day for nonpregnant women and 2 drinks a day for men. One drink equals 12 oz of beer, 5 oz of wine, or 1 oz of hard liquor.  Do not use any tobacco products, such as cigarettes, chewing tobacco, and e-cigarettes. If you need help quitting, ask your health care provider. Work With [employer] Provider  Have your blood glucose tested regularly, as told by your health care provider.  Discuss your risk factors and how you can reduce your risk for  diabetes.  Get screening tests as told by your health care provider. You may have screening tests regularly, especially if you have certain risk factors for type 2 diabetes.  Make an appointment with a diet and nutrition specialist (registered dietitian). A registered dietitian can help you make a healthy eating plan and can help you understand portion sizes and food labels. Why are these changes important?  It is possible to prevent or delay type 2 diabetes and related health problems by making lifestyle and nutrition changes.  It can be difficult to recognize signs of type 2 diabetes. The best way to avoid possible damage to your body is to take actions to prevent the disease before you develop symptoms. What can happen if changes are not made?  Your blood glucose levels may keep increasing. Having high blood glucose for a long time is dangerous. Too much glucose in your blood can damage your blood vessels, heart, kidneys, nerves, and eyes.  You may develop prediabetes or type 2 diabetes. Type 2 diabetes can lead to many chronic health problems and complications, such as: ? Heart disease. ? Stroke. ? Blindness. ? Kidney disease. ? Depression. ? Poor circulation in the feet and legs, which could lead to surgical removal (amputation) in severe cases. Where to find support:  Ask your health care provider to recommend a registered dietitian, diabetes educator, or weight loss program.  Look for local or online weight loss groups.  Join a gym, fitness club, or outdoor activity group, such as a walking club. Where to find more information: To learn more about diabetes and diabetes prevention, visit:  American Diabetes Association (ADA): www.diabetes.[employer] of Diabetes and Digestive and Kidney Diseases: FindSpin.nl  To learn more about healthy eating, visit:  The U.S. Department of Agriculture Scientist, research (physical sciences)), Choose My Plate:  http://wiley-williams.com/  Office of Disease Prevention and Health Promotion (ODPHP), Dietary Guidelines: SurferLive.at  Summary  You can reduce your risk for type 2 diabetes by increasing your physical activity, eating healthy foods, and losing weight as directed.  Talk with your health care provider about your risk for type 2 diabetes. Ask about any blood tests or screening tests that you need to have. This information is not intended to replace advice given to you by your health care provider. Make sure you discuss any questions you have with your health care provider. Document Released: 08/24/2015 Document Revised: 10/08/2015 Document Reviewed: 06/23/2015 Elsevier Interactive Patient Education  2018 [employer].   Aortic Valve Replacement, Care After Refer to this sheet in the next few weeks. These instructions provide you with information about caring for yourself after your procedure. Your health care provider may also give you more specific instructions. Your treatment has been planned according to current medical practices, but problems sometimes occur. Call your health care provider if you have any problems or questions after your procedure. What can I  expect after the procedure? After the procedure, it is common to have:  Pain around your incision area.  A small amount of blood or clear fluid coming from your incision.  Follow these instructions at home: Eating and drinking   Follow instructions from your health care provider about eating or drinking restrictions. ? Limit alcohol intake to no more than 1 drink per day for nonpregnant women and 2 drinks per day for men. One drink equals 12 oz of beer, 5 oz of wine, or 1 oz of hard liquor. ? Limit how much caffeine you drink. Caffeine can affect your heart's rate and rhythm.  Drink enough fluid to keep your urine clear or pale yellow.  Eat a heart-healthy diet. This should include plenty of fresh  fruits and vegetables. If you eat meat, it should be lean cuts. Avoid foods that are: ? High in salt, saturated fat, or sugar. ? Canned or highly processed. ? Fried. Activity  Return to your normal activities as told by your health care provider. Ask your health care provider what activities are safe for you.  Exercise regularly once you have recovered, as told by your health care provider.  Avoid sitting for more than 2 hours at a time without moving. Get up and move around at least once every 1-2 hours. This helps to prevent blood clots in the legs.  Do not lift anything that is heavier than 10 lb (4.5 kg) until your health care provider approves.  Avoid pushing or pulling things with your arms until your health care provider approves. This includes pulling on handrails to help you climb stairs. Incision care   Follow instructions from your health care provider about how to take care of your incision. Make sure you: ? Wash your hands with soap and water before you change your bandage (dressing). If soap and water are not available, use hand sanitizer. ? Change your dressing as told by your health care provider. ? Leave stitches (sutures), skin glue, or adhesive strips in place. These skin closures may need to stay in place for 2 weeks or longer. If adhesive strip edges start to loosen and curl up, you may trim the loose edges. Do not remove adhesive strips completely unless your health care provider tells you to do that.  Check your incision area every day for signs of infection. Check for: ? More redness, swelling, or pain. ? More fluid or blood. ? Warmth. ? Pus or a bad smell. Medicines  Take over-the-counter and prescription medicines only as told by your health care provider.  If you were prescribed an antibiotic medicine, take it as told by your health care provider. Do not stop taking the antibiotic even if you start to feel better. Travel  Avoid airplane travel for as long  as told by your health care provider.  When you travel, bring a list of your medicines and a record of your medical history with you. Carry your medicines with you. Driving  Ask your health care provider when it is safe for you to drive. Do not drive until your health care provider approves.  Do not drive or operate heavy machinery while taking prescription pain medicine. Lifestyle   Do not use any tobacco products, such as cigarettes, chewing tobacco, or e-cigarettes. If you need help quitting, ask your health care provider.  Resume sexual activity as told by your health care provider. Do not use medicines for erectile dysfunction unless your health care provider approves, if this applies.  Work with your health care provider to keep your blood pressure and cholesterol under control, and to manage any other heart conditions that you have.  Maintain a healthy weight. General instructions  Do not take baths, swim, or use a hot tub until your health care provider approves.  Do not strain to have a bowel movement.  Avoid crossing your legs while sitting down.  Check your temperature every day for a fever. A fever may be a sign of infection.  If you are a woman and you plan to become pregnant, talk with your health care provider before you become pregnant.  Wear compression stockings if your health care provider instructs you to do this. These stockings help to prevent blood clots and reduce swelling in your legs.  Tell all health care providers who care for you that you have an artificial (prosthetic) aortic valve. If you have or have had heart disease or endocarditis, tell all health care providers about these conditions as well.  Keep all follow-up visits as told by your health care provider. This is important. Contact a health care provider if:  You develop a skin rash.  You experience sudden, unexplained changes in your weight.  You have more redness, swelling, or pain  around your incision.  You have more fluid or blood coming from your incision.  Your incision feels warm to the touch.  You have pus or a bad smell coming from your incision.  You have a fever. Get help right away if:  You develop chest pain that is different from the pain coming from your incision.  You develop shortness of breath or difficulty breathing.  You start to feel light-headed. These symptoms may represent a serious problem that is an emergency. Do not wait to see if the symptoms will go away. Get medical help right away. Call your local emergency services (911 in the [place] Do not drive yourself to the hospital. This information is not intended to replace advice given to you by your health care provider. Make sure you discuss any questions you have with your health care provider. Document Released: 11/18/2004 Document Revised: 10/08/2015 Document Reviewed: 04/05/2015 Elsevier Interactive Patient Education  2017 [employer].

## 2017-05-22 NOTE — Care Management Note (Addendum)
Case Management Note  Patient Details  Name: SHAYON TROMPETER MRN: [id number] Date of Birth: 12-Jun-1950  Subjective/Objective:   From home with fiance,  pta indep, she is able to assist him at dc.  He is POD 4 AVR, was in afib with rvr, was on ami.  Plan for dc to home tomorrow.  He has a PCP and medication coverage.                Action/Plan: NCM will follow for dc needs.   Expected Discharge Date:                  Expected Discharge Plan:  Home/Self Care  In-House Referral:     Discharge planning Services  CM Consult  Post Acute Care Choice:    Choice offered to:     DME Arranged:    DME Agency:     HH Arranged:    [place] Agency:     Status of Service:  Completed, signed off  If discussed at [employer] of Stay Meetings, dates discussed:    Additional Comments:  Zenon Mayo, RN 05/22/2017, 12:55 PM

## 2017-05-22 NOTE — Progress Notes (Addendum)
4 Days Post-Op Procedure(s) (LRB): AORTIC VALVE REPLACEMENT (AVR),TEE (N/A) TRANSESOPHAGEAL ECHOCARDIOGRAM (TEE) (N/A) Subjective: No complaints. Ambulating well. Bowels working.  Objective: Vital signs in last 24 hours: Temp:  [98.8 F (37.1 C)-99.7 F (37.6 C)] 99 F (37.2 C) (01/07 0730) Pulse Rate:  [92-111] 104 (01/07 0700) Cardiac Rhythm: Sinus tachycardia (01/07 0331) Resp:  [16-26] 18 (01/07 0700) BP: (100-155)/(66-90) 131/77 (01/07 0700) SpO2:  [93 %-98 %] 94 % (01/07 0700) Weight:  [73.8 kg (162 lb 11.2 oz)] 73.8 kg (162 lb 11.2 oz) (01/07 0400)  Hemodynamic parameters for last 24 hours:    Intake/Output from previous day: 01/06 0701 - 01/07 0700 In: 640 [P.O.:640] Out: 1603 [Urine:1600; Stool:3] Intake/Output this shift: No intake/output data recorded.  General appearance: alert and cooperative Neurologic: intact Heart: regular rate and rhythm, S1, S2 normal, no murmur, click, rub or gallop Lungs: clear to auscultation bilaterally Extremities: extremities normal, atraumatic, no cyanosis or edema Wound: incision ok  Lab Results: Recent Labs    05/21/17 0252 05/22/17 0213  WBC 6.8 6.2  HGB 9.4* 9.4*  HCT 29.5* 29.0*  PLT 133* 198   BMET:  Recent Labs    05/21/17 0252 05/22/17 0213  NA 133* 138  K 4.1 3.7  CL 100* 102  CO2 28 27  GLUCOSE 91 110*  BUN 12 10  CREATININE 0.99 1.03  CALCIUM 8.4* 8.3*    PT/INR: No results for input(s): LABPROT, INR in the last 72 hours. ABG    Component Value Date/Time   PHART 7.308 (L) 05/19/2017 0329   HCO3 23.7 05/19/2017 0329   TCO2 27 05/19/2017 1606   ACIDBASEDEF 2.0 05/19/2017 0329   O2SAT 96.0 05/19/2017 0329   CBG (last 3)  Recent Labs    05/19/17 0736 05/19/17 1136  GLUCAP 102* 113*    Assessment/Plan: S/P Procedure(s) (LRB): AORTIC VALVE REPLACEMENT (AVR),TEE (N/A) TRANSESOPHAGEAL ECHOCARDIOGRAM (TEE) (N/A)   POD 4 He is doing well overall Hemodynamics stable Postop atrial  fibrillation converted to sinus with oral amiodarone. His is sinus tachy 102 now. Will continue amiodarone and increase Lopressor to 50 bid. Transfer to 4E and continue mobilization today. Plan home tomorrow.   LOS: 4 days    Gaye Pollack 05/22/2017  I have seen and examined the patient and agree with the assessment and plan as outlined.  Stable day.  Still waiting for bed on 4E for transfer  Rexene Alberts, MD 05/22/2017 6:31 PM

## 2017-05-23 MED ORDER — AMIODARONE HCL 400 MG PO TABS: 200.0000 mg | ORAL_TABLET | Freq: Two times a day (BID) | ORAL | 1 refills | Status: DC

## 2017-05-23 MED ORDER — LISINOPRIL 2.5 MG PO TABS
2.5000 mg | ORAL_TABLET | Freq: Every day | ORAL | Status: DC
  Administered 2017-05-23: 2.5 mg via ORAL
  Filled 2017-05-23 (×2): qty 1

## 2017-05-23 MED ORDER — TRAMADOL HCL 50 MG PO TABS: ORAL_TABLET | ORAL | 0 refills | Status: DC

## 2017-05-23 MED ORDER — METOPROLOL SUCCINATE ER 100 MG PO TB24: 100.0000 mg | ORAL_TABLET | Freq: Every day | ORAL | 1 refills | Status: DC

## 2017-05-23 MED ORDER — ASPIRIN 325 MG PO TBEC: 325.0000 mg | DELAYED_RELEASE_TABLET | Freq: Every day | ORAL | 0 refills | Status: DC

## 2017-05-23 MED ORDER — ACETAMINOPHEN 325 MG PO TABS: 650.0000 mg | ORAL_TABLET | Freq: Four times a day (QID) | ORAL | Status: DC | PRN

## 2017-05-23 NOTE — Progress Notes (Addendum)
TCTS DAILY ICU PROGRESS NOTE                   [address]            [place] [address]          [phone]   5 Days Post-Op Procedure(s) (LRB): AORTIC VALVE REPLACEMENT (AVR),TEE (N/A) TRANSESOPHAGEAL ECHOCARDIOGRAM (TEE) (N/A)  Total Length of Stay:  LOS: 5 days   Subjective: Patient sitting in chair without complaints. He wants to go home.   Objective: Vital signs in last 24 hours: Temp:  [99.1 F (37.3 C)-99.9 F (37.7 C)] 99.2 F (37.3 C) (01/08 0733) Pulse Rate:  [90-105] 95 (01/08 0353) Cardiac Rhythm: Heart block (01/07 2337) Resp:  [13-23] 23 (01/08 0353) BP: (130-151)/(65-83) 131/72 (01/08 0353) SpO2:  [96 %-100 %] 96 % (01/08 0353) Weight:  [160 lb 4.4 oz (72.7 kg)] 160 lb 4.4 oz (72.7 kg) (01/08 0500)  Filed Weights   05/21/17 0500 05/22/17 0400 05/23/17 0500  Weight: 168 lb 10.4 oz (76.5 kg) 162 lb 11.2 oz (73.8 kg) 160 lb 4.4 oz (72.7 kg)    Weight change: -6.8 oz (-1.1 kg)      Intake/Output from previous day: 01/07 0701 - 01/08 0700 In: 480 [P.O.:480] Out: 1000 [Urine:1000]  Intake/Output this shift: No intake/output data recorded.  Current Meds: Scheduled Meds: . ALPRAZolam  0.25 mg Oral BID  . amiodarone  400 mg Oral BID  . aspirin EC  325 mg Oral Daily  . enoxaparin (LOVENOX) injection  40 mg Subcutaneous QHS  . loratadine  10 mg Oral Daily  . metoprolol tartrate  50 mg Oral BID  . moving right along book   Does not apply Once  . pantoprazole  40 mg Oral QAC breakfast  . sodium chloride flush  3 mL Intravenous Q12H  . thiamine  100 mg Oral Daily   Continuous Infusions: . sodium chloride     PRN Meds:.sodium chloride, acetaminophen, ondansetron **OR** ondansetron (ZOFRAN) IV, oxyCODONE, sodium chloride flush, traMADol  General appearance: alert, cooperative and no distress Neurologic: intact Heart: RRR, no murmur Lungs: clear to auscultation bilaterally Abdomen: Soft, non tender, bowel sounds  present Extremities: No LE edema Wound: Sternal wound is clean and dry;no sign of infection  Lab Results: CBC: Recent Labs    05/21/17 0252 05/22/17 0213  WBC 6.8 6.2  HGB 9.4* 9.4*  HCT 29.5* 29.0*  PLT 133* 198   BMET:  Recent Labs    05/21/17 0252 05/22/17 0213  NA 133* 138  K 4.1 3.7  CL 100* 102  CO2 28 27  GLUCOSE 91 110*  BUN 12 10  CREATININE 0.99 1.03  CALCIUM 8.4* 8.3*    CMET: Lab Results  Component Value Date   WBC 6.2 05/22/2017   HGB 9.4 (L) 05/22/2017   HCT 29.0 (L) 05/22/2017   PLT 198 05/22/2017   GLUCOSE 110 (H) 05/22/2017   ALT 16 (L) 05/15/2017   AST 19 05/15/2017   NA 138 05/22/2017   K 3.7 05/22/2017   CL 102 05/22/2017   CREATININE 1.03 05/22/2017   BUN 10 05/22/2017   CO2 27 05/22/2017   INR 1.43 05/18/2017   HGBA1C 5.7 (H) 05/15/2017     PT/INR: No results for input(s): LABPROT, INR in the last 72 hours. Radiology: No results found.   Assessment/Plan: S/P Procedure(s) (LRB): AORTIC VALVE REPLACEMENT (AVR),TEE (N/A) TRANSESOPHAGEAL ECHOCARDIOGRAM (TEE) (N/A)   1. CV-Previous a fib with RVR. Maintaining  SR, first degree heart block. On Amiodarone 400 mg bid and Lopresor 50 mg bid. Will decrease Amiodarone to 200 mg bid at discharge and restart Lisinopril 2.5 mg daily for better BP control. 2. Pulmonary-On room air. 3. ABL anemia-H and H 9.4 and 29 4. Remove EPW 5. Chest tube sutures to remain-will remove in office 6. Discharge today   Nani Skillern PA-C 05/23/2017 7:40 AM    Chart reviewed, patient examined, agree with above. Doing well  Plan home today.

## 2017-05-23 NOTE — Significant Event (Signed)
Reviewed AVS with patient and his spouse. All questions answered. Patient and spouse verbalize understanding of upcoming appointments, medications, activity, etc as outline in AVS. A copy of AVS and paper prescriptions given to them. All personal belongings accounted for by patient, and taken home. Patient taken to transportation with volunteer, by wheelchair.    Jeremy Larson

## 2017-05-23 NOTE — Progress Notes (Signed)
Reviewed ed with pt and significant other. Good reception, has been walking independently. Will refer to [place] [facility] [place] CES, ACSM 9:43 AM 05/23/2017

## 2017-05-23 NOTE — Significant Event (Signed)
EPWs removed without complications. No blood or tissues noted on end tips. Sites cleansed with betadine. Patient is bedrest for 1 hour.     Jeremy Larson

## 2017-05-23 NOTE — Plan of Care (Signed)
  Progressing Health Behavior/Discharge Planning: Ability to manage health-related needs will improve 05/23/2017 0123 - Progressing by Reinaldo Berber, RN Education: Ability to demonstrate proper wound care will improve 05/23/2017 0123 - Progressing by Reinaldo Berber, RN Knowledge of disease or condition will improve 05/23/2017 0123 - Progressing by Reinaldo Berber, RN Knowledge of the prescribed therapeutic regimen will improve 05/23/2017 0123 - Progressing by Reinaldo Berber, RN

## 2017-05-24 MED FILL — Mannitol IV Soln 20%: INTRAVENOUS | Qty: 500 | Status: AC

## 2017-05-24 MED FILL — Sodium Bicarbonate IV Soln 8.4%: INTRAVENOUS | Qty: 50 | Status: AC

## 2017-05-24 MED FILL — Sodium Chloride IV Soln 0.9%: INTRAVENOUS | Qty: 2000 | Status: AC

## 2017-05-24 MED FILL — Heparin Sodium (Porcine) Inj 1000 Unit/ML: INTRAMUSCULAR | Qty: 10 | Status: AC

## 2017-05-24 MED FILL — Electrolyte-R (PH 7.4) Solution: INTRAVENOUS | Qty: 4000 | Status: AC

## 2017-05-24 MED FILL — Lidocaine HCl IV Inj 20 MG/ML: INTRAVENOUS | Qty: 10 | Status: AC

## 2017-05-30 ENCOUNTER — Other Ambulatory Visit: Payer: Self-pay

## 2017-05-30 ENCOUNTER — Ambulatory Visit (INDEPENDENT_AMBULATORY_CARE_PROVIDER_SITE_OTHER): Payer: Self-pay

## 2017-05-30 DIAGNOSIS — Z4802 Encounter for removal of sutures: Secondary | ICD-10-CM

## 2017-05-30 NOTE — Progress Notes (Signed)
Patient arrived for nurse visit to remove suture post- procedure of AVR 05/18/2017.  3 mid-abdominal Sutures removed with no signs/ symptoms of infection noted site was cleaned.  Patient tolerated procedure well.  Patient/ family instructed to keep the incision sites clean and dry.  Patient/ family acknowledged instructions given.

## 2017-06-04 NOTE — Progress Notes (Signed)
Cardiology Office Note    Date:  06/05/2017   ID:  Jeremy Larson, DOB 1950/05/20, MRN [id number]  PCP:  Caryl Bis, MD  Cardiologist: Dr. Harl Bowie   Chief Complaint  Patient presents with  . Hospitalization Follow-up    s/p AVR    History of Present Illness:    Jeremy Larson is a 67 y.o. male with past medical history of severe aortic stenosis and chronic systolic CHF who presents to the office today for hospital follow-up.   The patient has been followed by Dr. Harl Bowie for his aortic stenosis and most recent echo showed severe stenosis. With his worsening dyspnea on exertion, he underwent workup for AVR which included a cardiac catheterization in 03/2017 and showed nonobstructive CAD. He was referred to Dr. Cyndia Bent for further evaluation of AVR and it was recommended he undergo conventional AVR for which the patient agreed to proceed.   He therefore presented to [facility] on 05/18/2017 for the procedure and underwent AVR on 05/18/2017 using a 23 mm Edwards Magna-Ease Pericardial valve. No immediate complications were noted and he was successfully extubated the evening of his surgery. He had an expected post-operative anemia but did not require transfusions. Hgb was stable at 9.4 at the time of discharge. He did experience atrial fibrillation with RVR and converted to NSR with IV Amiodarone. Was decreased to 200mg  BID at the time of hospital discharge with instructions to further reduce to 200mg  daily after one week.   In talking with the patient today, he reports overall doing well following his recent surgery. He has been walking around his home and the grocery store without any chest pain or dyspnea on exertion. Reports breathing is significantly improved when compared to his pre-operative state. He denies any recent palpitations, orthopnea, PND, or lower extremity edema.   He denies any incisional pain. No longer taking pain medications. He has continued to follow BP at home and  reports this has been well-controlled. Is at 132/76 during today's visit.    Past Medical History:  Diagnosis Date  . Aortic stenosis    a. s/p AVR on 05/18/2017 with 23 mm Edwards Magna-Ease Pericardial valve  . Chronic systolic (congestive) heart failure ([place])    a. 05/2017: TEE showing EF 40-45%  . Dyspnea     Past Surgical History:  Procedure Laterality Date  . AORTIC VALVE REPLACEMENT N/A 05/18/2017   Procedure: AORTIC VALVE REPLACEMENT (AVR),TEE;  Surgeon: Gaye Pollack, MD;  Location: [facility];  Service: Open Heart Surgery;  Laterality: N/A;  . HAND SURGERY     left hang following trauma  . RIGHT/LEFT HEART CATH AND CORONARY ANGIOGRAPHY N/A 04/12/2017   Procedure: RIGHT/LEFT HEART CATH AND CORONARY ANGIOGRAPHY;  Surgeon: Burnell Blanks, MD;  Location: [facility];  Service: Cardiovascular;  Laterality: N/A;  . TEE WITHOUT CARDIOVERSION N/A 05/18/2017   Procedure: TRANSESOPHAGEAL ECHOCARDIOGRAM (TEE);  Surgeon: Gaye Pollack, MD;  Location: [facility];  Service: Open Heart Surgery;  Laterality: N/A;    Current Medications: Outpatient Medications Prior to Visit  Medication Sig Dispense Refill  . acetaminophen (TYLENOL) 325 MG tablet Take 2 tablets (650 mg total) by mouth every 6 (six) hours as needed for mild pain.    Marland Kitchen amiodarone (PACERONE) 400 MG tablet Take 0.5 tablets (200 mg total) by mouth 2 (two) times daily. For one week then take Amiodarone 200 mg by mouth daily thereafter. 30 tablet 1  . aspirin EC 325 MG EC tablet  Take 1 tablet (325 mg total) by mouth daily. 30 tablet 0  . lisinopril (PRINIVIL,ZESTRIL) 2.5 MG tablet Take 1 tablet (2.5 mg total) daily by mouth. 90 tablet 3  . loratadine (CLARITIN) 10 MG tablet Take 10 mg daily by mouth.    . metoprolol succinate (TOPROL-XL) 100 MG 24 hr tablet Take 1 tablet (100 mg total) by mouth daily. 30 tablet 1  . traMADol (ULTRAM) 50 MG tablet Take 50 mg by mouth every 4-6 hours PRN moderate to severe pain. 30 tablet 0    No facility-administered medications prior to visit.      Allergies:   Patient has no known allergies.   Social History   Socioeconomic History  . Marital status: Married    Spouse name: None  . Number of children: None  . Years of education: None  . Highest education level: None  Social Needs  . Financial resource strain: None  . Food insecurity - worry: None  . Food insecurity - inability: None  . Transportation needs - medical: None  . Transportation needs - non-medical: None  Occupational History  . None  Tobacco Use  . Smoking status: Former Smoker    Packs/day: 1.00    Years: 10.00    Pack years: 10.00    Types: Cigarettes  . Smokeless tobacco: Current User  Substance and Sexual Activity  . Alcohol use: No  . Drug use: No  . Sexual activity: None  Other Topics Concern  . None  Social History Narrative  . None     Family History:  The patient's family history includes Alcohol abuse in his brother; Cancer in his brother, father, and mother; Cirrhosis in his brother; Heart failure in his mother; Heart murmur in his unknown relative; Lung cancer in his unknown relative.   Review of Systems:   Please see the history of present illness.     General:  No chills, fever, night sweats or weight changes. Positive for fatigue (improving). Cardiovascular:  No chest pain, dyspnea on exertion, edema, orthopnea, palpitations, paroxysmal nocturnal dyspnea. Dermatological: No rash, lesions/masses Respiratory: No cough, dyspnea Urologic: No hematuria, dysuria Abdominal:   No nausea, vomiting, diarrhea, bright red blood per rectum, melena, or hematemesis Neurologic:  No visual changes, wkns, changes in mental status. All other systems reviewed and are otherwise negative except as noted above.   Physical Exam:    VS:  BP 132/76   Pulse 74   Ht 5\' 9"  (1.753 m)   Wt 165 lb (74.8 kg)   SpO2 98%   BMI 24.37 kg/m    General: Well developed, well nourished Caucasian male  appearing in no acute distress. Head: Normocephalic, atraumatic, sclera non-icteric, no xanthomas, nares are without discharge.  Neck: No carotid bruits. JVD not elevated.  Lungs: Respirations regular and unlabored, without wheezes or rales.  Heart: Regular rate and rhythm. No S3 or S4.  No murmur, no rubs, or gallops appreciated. Sternal incision appears well-healing without erythema or drainage.  Abdomen: Soft, non-tender, non-distended with normoactive bowel sounds. No hepatomegaly. No rebound/guarding. No obvious abdominal masses. Msk:  Strength and tone appear normal for age. No joint deformities or effusions. Extremities: No clubbing or cyanosis. No lower extremity edema.  Distal pedal pulses are 2+ bilaterally. Neuro: Alert and oriented X 3. Moves all extremities spontaneously. No focal deficits noted. Psych:  Responds to questions appropriately with a normal affect. Skin: No rashes or lesions noted  Wt Readings from Last 3 Encounters:  06/05/17 165 lb (  74.8 kg)  05/23/17 160 lb 4.4 oz (72.7 kg)  05/15/17 167 lb 11.2 oz (76.1 kg)     Studies/Labs Reviewed:   EKG:  EKG is not ordered today.   Recent Labs: 05/15/2017: ALT 16 05/19/2017: Magnesium 2.5 05/22/2017: BUN 10; Creatinine, Ser 1.03; Potassium 3.7; Sodium 138 06/05/2017: Hemoglobin 11.0; Platelets 490   Lipid Panel No results found for: CHOL, TRIG, HDL, CHOLHDL, VLDL, LDLCALC, LDLDIRECT  Additional studies/ records that were reviewed today include:   Cardiac Catheterization: 04/12/2017  Prox RCA lesion is 10% stenosed.  Ost 2nd Mrg to 2nd Mrg lesion is 10% stenosed.  Prox LAD to Mid LAD lesion is 20% stenosed.   1. Mild non-obstructive CAD 2. Severe aortic stenosis by echo (unable to cross the aortic valve from the radial approach).   Recommendations: He will need referral to CT surgery to discuss surgical AVR. He is low risk for surgery and will not be a candidate for TAVR if his surgical risk is felt to be low.     TEE: 05/18/2017 Study Conclusions  - Left ventricle: Mild, concentric, hypertrophy was noted. Systolic   function was mildly to moderately reduced. The estimated ejection   fraction was in the range of 40% to 45%. There is diffuse   hypokinesis. - Aortic valve: Normal-sized, mildly calcified annulus. 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 Cusp separation was severely reduced. Transvalvular   velocity was increased, due to stenosis. There was severe   stenosis. The peak gradient is 99 mmHg, with a mean gradient of   60 mmHg. There was trivial regurgitation directed towards the   mitral valve anterior leaflet. - Staged echo: Limited post-CPB exam: Improved EF, with vigorous LV   function. Overall EF 50-60%. There are no wall motion   abnormalities. The prosthetic aortic valve is well seated in the   aortic annulus. No AI seen in LV outflow tract. Post aortic valve   replacement images demonstrate no residual valvular insufficiency   or perivalvular leak. There is essentially no change in mitral   valve function.  Impressions:  - Post aortic valve replacement surgery, the prosthetic valve   appears to be functioning normally. Excellent prosthetic aortic   valve function without evidence for perivalvular leak. Other   valves are essentially unchanged. LV function improvement from   pre-bypass images, with EF 50-60%. The small amount of   intracardiac air seen at the end of CPB was evacuated prior to   separation from pump. No other significant changes from   pre-bypass images.  Assessment:    1. Aortic valve stenosis, etiology of cardiac valve disease unspecified   2. S/P AVR (aortic valve replacement)   3. Chronic systolic heart failure ([place])   4. Postoperative atrial fibrillation ([facility])   5. Iron deficiency anemia, unspecified iron deficiency anemia type       Plan:   In order of problems listed above:  1. Aortic Stenosis/Status-Post AVR - the patient has been followed for severe aortic stenosis and recently underwent AVR on 05/18/2017 using a 23 mm Edwards Magna-Ease Pericardial valve. No immediate complications were noted.  - he reports overall doing well following his recent surgery and denies any recent chest pain or dyspnea on exertion. Incision appears well-healing.  - continue current medication regimen. Will plan for a repeat echo in 3 months to assess valve function following his recent AVR.  2. Chronic Systolic CHF - TEE in 12/6759 showed a reduced EF of 40-45%.  - he denies any recent dyspnea on exertion, orthopnea, or lower extremity edema. Does not appear volume overloaded by physical examination.  - continue Toprol-XL 100mg  daily and Lisinopril 2.5mg  daily. Will plan for a repeat echocardiogram in 3 months following AVR to assess valve function and EF.   3. Post-operative Atrial Fibrillation - noted to have atrial fibrillation following his recent AVR. Converted to NSR with IV Amiodarone.  - he denies any recurrent palpitations. Is maintaining NSR by examination today. Continue Amiodarone 200mg  daily. Can likely be discontinued once 4-6 weeks out from surgery.   4. Post-operative Anemia - Hgb at 9.4 at the time of hospital discharge. He denies any evidence of active bleeding.  - will recheck CBC today.    Medication Adjustments/Labs and Tests Ordered: Current medicines are reviewed at length with the patient today.  Concerns regarding medicines are outlined above.  Medication changes, Labs and Tests ordered today are listed in the Patient Instructions below. Patient Instructions  Your physician recommends that you schedule a follow-up appointment in:  3 months with Dr.Branch  Your physician has requested that you have an echocardiogram ([place]). Echocardiography is a painless test that uses  sound waves to create images of your heart. It provides your doctor with information about the size and shape of your heart and how well your heart's chambers and valves are working. This procedure takes approximately one hour. There are no restrictions for this procedure.  Please get CBC today  (lab work )  Your physician recommends that you continue on your current medications as directed. Please refer to the Current Medication list given to you today.  If you need a refill on your cardiac medications before your next appointment, please call your pharmacy.     Signed, Erma Heritage, PA-C  06/05/2017 4:08 PM    [place] [address] [place], [place] [address] Phone: [phone]

## 2017-06-05 ENCOUNTER — Encounter: Payer: Self-pay | Admitting: Student

## 2017-06-05 ENCOUNTER — Ambulatory Visit (INDEPENDENT_AMBULATORY_CARE_PROVIDER_SITE_OTHER): Payer: Medicare Other | Admitting: Student

## 2017-06-05 ENCOUNTER — Other Ambulatory Visit (HOSPITAL_COMMUNITY)
Admission: RE | Admit: 2017-06-05 | Discharge: 2017-06-05 | Disposition: A | Discharge status: Home / Self Care | Payer: Medicare Other | Source: Ambulatory Visit | Attending: Student | Admitting: Student

## 2017-06-05 VITALS — BP 132/76 | HR 74 | Ht 69.0 in | Wt 165.0 lb

## 2017-06-05 DIAGNOSIS — I350 Nonrheumatic aortic (valve) stenosis: Secondary | ICD-10-CM

## 2017-06-05 DIAGNOSIS — Z952 Presence of prosthetic heart valve: Secondary | ICD-10-CM | POA: Diagnosis not present

## 2017-06-05 DIAGNOSIS — I4891 Unspecified atrial fibrillation: Secondary | ICD-10-CM | POA: Diagnosis not present

## 2017-06-05 DIAGNOSIS — I5022 Chronic systolic (congestive) heart failure: Secondary | ICD-10-CM | POA: Diagnosis not present

## 2017-06-05 DIAGNOSIS — D509 Iron deficiency anemia, unspecified: Secondary | ICD-10-CM

## 2017-06-05 DIAGNOSIS — I9789 Other postprocedural complications and disorders of the circulatory system, not elsewhere classified: Secondary | ICD-10-CM | POA: Diagnosis not present

## 2017-06-05 LAB — CBC
HEMATOCRIT: 34.9 % — AB (ref 39.0–52.0)
Hemoglobin: 11 g/dL — ABNORMAL LOW (ref 13.0–17.0)
MCH: 30.2 pg (ref 26.0–34.0)
MCHC: 31.5 g/dL (ref 30.0–36.0)
MCV: 95.9 fL (ref 78.0–100.0)
Platelets: 490 10*3/uL — ABNORMAL HIGH (ref 150–400)
RBC: 3.64 MIL/uL — ABNORMAL LOW (ref 4.22–5.81)
RDW: 13 % (ref 11.5–15.5)
WBC: 6.5 10*3/uL (ref 4.0–10.5)

## 2017-06-05 NOTE — Patient Instructions (Addendum)
Your physician recommends that you schedule a follow-up appointment in:  3 months with Dr.Branch   Your physician has requested that you have an echocardiogram ([place]). Echocardiography is a painless test that uses sound waves to create images of your heart. It provides your doctor with information about the size and shape of your heart and how well your heart's chambers and valves are working. This procedure takes approximately one hour. There are no restrictions for this procedure.    Please get CBC today  (lab work )   Your physician recommends that you continue on your current medications as directed. Please refer to the Current Medication list given to you today.    If you need a refill on your cardiac medications before your next appointment, please call your pharmacy.

## 2017-06-09 ENCOUNTER — Encounter (HOSPITAL_COMMUNITY): Payer: Self-pay

## 2017-06-20 ENCOUNTER — Other Ambulatory Visit: Payer: Self-pay | Admitting: Surgery

## 2017-06-20 DIAGNOSIS — Z952 Presence of prosthetic heart valve: Secondary | ICD-10-CM

## 2017-06-21 ENCOUNTER — Encounter: Payer: Self-pay | Admitting: Surgery

## 2017-06-21 ENCOUNTER — Other Ambulatory Visit: Payer: Self-pay

## 2017-06-21 ENCOUNTER — Ambulatory Visit
Admission: RE | Admit: 2017-06-21 | Discharge: 2017-06-21 | Disposition: A | Discharge status: Home / Self Care | Payer: Medicare Other | Source: Ambulatory Visit | Attending: Surgery | Admitting: Surgery

## 2017-06-21 ENCOUNTER — Ambulatory Visit (INDEPENDENT_AMBULATORY_CARE_PROVIDER_SITE_OTHER): Payer: Self-pay | Admitting: Surgery

## 2017-06-21 VITALS — BP 139/76 | HR 68 | Resp 18 | Ht 69.0 in | Wt 163.0 lb

## 2017-06-21 DIAGNOSIS — J9811 Atelectasis: Secondary | ICD-10-CM | POA: Diagnosis not present

## 2017-06-21 DIAGNOSIS — Z952 Presence of prosthetic heart valve: Secondary | ICD-10-CM

## 2017-06-21 DIAGNOSIS — I350 Nonrheumatic aortic (valve) stenosis: Secondary | ICD-10-CM

## 2017-06-21 NOTE — Progress Notes (Signed)
      HPI:  Patient returns for routine postoperative follow-up having undergone aortic valve replacement using a 23 mm Edwards pericardial valve on 05/18/2017. The patient's early postoperative recovery while in the hospital was notable for an uncomplicated postoperative course.  He did develop postoperative atrial fibrillation but converted with amiodarone.  He has not had any tachypalpitations since going home. Since hospital discharge the patient reports that he has been feeling well.  He is walking daily without chest pain or shortness of breath.  His energy level is much better than it was preoperatively.   Current Outpatient Medications  Medication Sig Dispense Refill  . acetaminophen (TYLENOL) 325 MG tablet Take 2 tablets (650 mg total) by mouth every 6 (six) hours as needed for mild pain.    Marland Kitchen amiodarone (PACERONE) 400 MG tablet Take 0.5 tablets (200 mg total) by mouth 2 (two) times daily. For one week then take Amiodarone 200 mg by mouth daily thereafter. 30 tablet 1  . aspirin EC 325 MG EC tablet Take 1 tablet (325 mg total) by mouth daily. 30 tablet 0  . lisinopril (PRINIVIL,ZESTRIL) 2.5 MG tablet Take 1 tablet (2.5 mg total) daily by mouth. 90 tablet 3  . loratadine (CLARITIN) 10 MG tablet Take 10 mg daily by mouth.    . metoprolol succinate (TOPROL-XL) 100 MG 24 hr tablet Take 1 tablet (100 mg total) by mouth daily. 30 tablet 1  . traMADol (ULTRAM) 50 MG tablet Take 50 mg by mouth every 4-6 hours PRN moderate to severe pain. 30 tablet 0   No current facility-administered medications for this visit.     Physical Exam BP 139/76 (BP Location: Left Arm, Patient Position: Sitting, Cuff Size: Large)   Pulse 68   Resp 18   Ht 5\' 9"  (1.753 m)   Wt 163 lb (73.9 kg)   SpO2 98% Comment: RA  BMI 24.07 kg/m  He looks well. Lung exam is clear. Cardiac exam shows a regular rate and rhythm with normal heart sounds. Chest incision is healing well and sternum is stable. There is no  peripheral edema.   Diagnostic Tests:  CLINICAL DATA:  Status post aortic valve replacement.  EXAM: CHEST  2 VIEW  COMPARISON:  Radiographs of May 21, 2017.  FINDINGS: The heart size and mediastinal contours are within normal limits. Aortic valve prosthesis is noted. Atherosclerosis of thoracic aorta is noted. Left lung is clear. Mild right basilar subsegmental atelectasis is noted. No pneumothorax or pleural effusion is noted. The visualized skeletal structures are unremarkable.  IMPRESSION: Mild right basilar subsegmental atelectasis. Aortic atherosclerosis.   Electronically Signed   By: Marijo Conception, M.D.   On: 06/21/2017 09:22  Impression:  Overall I think he is doing well. I encouraged him to continue walking.  I told him he could drive his car but should not lift anything heavier than 10 lbs for three months postop.  He would like to get off of the amiodarone and appears to be maintaining sinus rhythm so I told him that he could discontinue that.  He will continue his metoprolol.  Plan:  He is going to continue follow-up with Dr. Harl Bowie and will return to see me if he develops any problems with his incisions.  Gaye Pollack, MD [facility] [phone]

## 2017-06-23 ENCOUNTER — Ambulatory Visit: Payer: Medicare Other | Admitting: Cardiology

## 2017-07-18 ENCOUNTER — Other Ambulatory Visit: Payer: Self-pay | Admitting: *Deleted

## 2017-07-18 ENCOUNTER — Other Ambulatory Visit: Payer: Self-pay | Admitting: Cardiology

## 2017-07-18 ENCOUNTER — Other Ambulatory Visit: Payer: Self-pay | Admitting: Physician Assistant

## 2017-07-18 ENCOUNTER — Telehealth: Payer: Self-pay

## 2017-07-18 ENCOUNTER — Telehealth: Payer: Self-pay | Admitting: Cardiology

## 2017-07-18 MED ORDER — METOPROLOL SUCCINATE ER 100 MG PO TB24: 100.0000 mg | ORAL_TABLET | Freq: Every day | ORAL | 0 refills | Status: DC

## 2017-07-18 MED ORDER — METOPROLOL SUCCINATE ER 100 MG PO TB24: 100.0000 mg | ORAL_TABLET | Freq: Every day | ORAL | 3 refills | Status: DC

## 2017-07-18 NOTE — Telephone Encounter (Signed)
Attempt to reach, LMTCB-cc 

## 2017-07-18 NOTE — Telephone Encounter (Signed)
Called Jeremy Larson. He was wanting to know if he was to continue the current dose of metoprolol. Looking back on Dr. Vivi Martens last office note 2/6- he would like for Jeremy Larson to continue taking metoprolol. I advised Jeremy Larson. He stated he remembered him saying that. Jeremy Larson is to have a 3 month follow up with Dr. Harl Bowie with echo just before visit. I will send to front office to schedule.

## 2017-07-18 NOTE — Telephone Encounter (Signed)
Patient has questions regarding dosage of Metoprolol. / tg

## 2017-07-21 NOTE — Telephone Encounter (Signed)
3/8 lmtcb-cc

## 2017-07-25 NOTE — Telephone Encounter (Signed)
Called pt., no answer. Left message for pt to return call.  

## 2017-08-11 ENCOUNTER — Ambulatory Visit (HOSPITAL_COMMUNITY)
Admission: RE | Admit: 2017-08-11 | Discharge: 2017-08-11 | Disposition: A | Discharge status: Home / Self Care | Payer: Medicare Other | Source: Ambulatory Visit | Attending: Student | Admitting: Student

## 2017-08-11 DIAGNOSIS — Z952 Presence of prosthetic heart valve: Secondary | ICD-10-CM | POA: Insufficient documentation

## 2017-08-11 DIAGNOSIS — I10 Essential (primary) hypertension: Secondary | ICD-10-CM | POA: Insufficient documentation

## 2017-08-11 NOTE — Progress Notes (Signed)
*  PRELIMINARY RESULTS* Echocardiogram 2D Echocardiogram has been performed.  Jeremy Larson 08/11/2017, 9:12 AM

## 2017-08-25 ENCOUNTER — Encounter: Payer: Self-pay | Admitting: *Deleted

## 2017-08-28 ENCOUNTER — Other Ambulatory Visit: Payer: Self-pay

## 2017-08-28 ENCOUNTER — Encounter: Payer: Self-pay | Admitting: Cardiology

## 2017-08-28 ENCOUNTER — Ambulatory Visit (INDEPENDENT_AMBULATORY_CARE_PROVIDER_SITE_OTHER): Payer: Medicare Other | Admitting: Cardiology

## 2017-08-28 VITALS — BP 139/86 | HR 74 | Ht 69.0 in | Wt 168.0 lb

## 2017-08-28 DIAGNOSIS — I5022 Chronic systolic (congestive) heart failure: Secondary | ICD-10-CM

## 2017-08-28 DIAGNOSIS — I350 Nonrheumatic aortic (valve) stenosis: Secondary | ICD-10-CM

## 2017-08-28 DIAGNOSIS — Z952 Presence of prosthetic heart valve: Secondary | ICD-10-CM

## 2017-08-28 MED ORDER — METOPROLOL SUCCINATE ER 50 MG PO TB24: 50.0000 mg | ORAL_TABLET | Freq: Every day | ORAL | 1 refills | Status: DC

## 2017-08-28 NOTE — Progress Notes (Signed)
Clinical Summary Jeremy Larson is a 67 y.o.male seen today for follow up of the following medical problems.   1. Aortic stenosis/Bicuspid aortic valve - probable bicuspid AV by prior echo.  - echo 01/2010 LVEF 60-65%, bicuspid AV mod AS mean grad 34 and AVA VTI 1.12 03/2017 echo: LVEF 02-72%, grade I diastolic dysfunction, AV mean grad 51, AVA VTI 0.39.  03/2017 cath: mild nonsobstructive CAD. Normal filling pressures by RHC 03/2017 CTA Chest/Abd/Pelvis: no aortic pathology  - s/p AVR with 23 mm [facility] Ease pericardial valve Jan 2019 - f/u echo 07/2017 shows LVEF 53-66%, grade I diastolic dysfunction, normal functioning AVR. - no recent SOB/DOE. No chest pain. Walks daily, up to 2-3 hrs.  - not interested in cardiac rehab  2. Postoperative afib - s/p AVR. Has been on amiodarone.  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    Past Medical History:  Diagnosis Date  . Aortic stenosis    a. s/p AVR on 05/18/2017 with 23 mm Edwards Magna-Ease Pericardial valve  . Chronic systolic (congestive) heart failure ([place])    a. 05/2017: TEE showing EF 40-45%  . Dyspnea      No Known Allergies   Current Outpatient Medications  Medication Sig Dispense Refill  . acetaminophen (TYLENOL) 325 MG tablet Take 2 tablets (650 mg total) by mouth every 6 (six) hours as needed for mild pain.    Marland Kitchen amiodarone (PACERONE) 400 MG tablet Take 0.5 tablets (200 mg total) by mouth 2 (two) times daily. For one week then take Amiodarone 200 mg by mouth daily thereafter. 30 tablet 1  . aspirin EC 325 MG EC tablet Take 1 tablet (325 mg total) by mouth daily. 30 tablet 0  . lisinopril (PRINIVIL,ZESTRIL) 2.5 MG tablet Take 1 tablet (2.5 mg total) daily by mouth. 90 tablet 3  . loratadine (CLARITIN) 10 MG tablet Take 10 mg daily by mouth.    . metoprolol succinate (TOPROL-XL) 100 MG 24 hr tablet Take 1 tablet  (100 mg total) by mouth daily. Take with or immediately following a meal. 90 tablet 3  . traMADol (ULTRAM) 50 MG tablet Take 50 mg by mouth every 4-6 hours PRN moderate to severe pain. 30 tablet 0   No current facility-administered medications for this visit.      Past Surgical History:  Procedure Laterality Date  . AORTIC VALVE REPLACEMENT N/A 05/18/2017   Procedure: AORTIC VALVE REPLACEMENT (AVR),TEE;  Surgeon: Gaye Pollack, MD;  Location: [place];  Service: Open Heart Surgery;  Laterality: N/A;  . HAND SURGERY     left hang following trauma  . RIGHT/LEFT HEART CATH AND CORONARY ANGIOGRAPHY N/A 04/12/2017   Procedure: RIGHT/LEFT HEART CATH AND CORONARY ANGIOGRAPHY;  Surgeon: Burnell Blanks, MD;  Location: [place] [facility];  Service: Cardiovascular;  Laterality: N/A;  . TEE WITHOUT CARDIOVERSION N/A 05/18/2017   Procedure: TRANSESOPHAGEAL ECHOCARDIOGRAM (TEE);  Surgeon: Gaye Pollack, MD;  Location: [place];  Service: Open Heart Surgery;  Laterality: N/A;     No Known Allergies    Family History  Problem Relation Age of Onset  . Lung cancer Unknown        brother and father  . Heart murmur Unknown        mother  . Cancer Mother   . Heart failure Mother   . Cancer Father   . Cancer  Brother   . Cirrhosis Brother   . Alcohol abuse Brother      Social History Mr. Demarais reports that he has quit smoking. His smoking use included cigarettes. He has a 10.00 pack-year smoking history. He uses smokeless tobacco. Mr. Bochicchio reports that he does not drink alcohol.   Review of Systems CONSTITUTIONAL: No weight loss, fever, chills, weakness or fatigue.  HEENT: Eyes: No visual loss, blurred vision, double vision or yellow sclerae.No hearing loss, sneezing, congestion, runny nose or sore throat.  SKIN: No rash or itching.  CARDIOVASCULAR: per hpi RESPIRATORY: No shortness of breath, cough or sputum.  GASTROINTESTINAL: No anorexia, nausea, vomiting or diarrhea. No abdominal  pain or blood.  GENITOURINARY: No burning on urination, no polyuria NEUROLOGICAL: No headache, dizziness, syncope, paralysis, ataxia, numbness or tingling in the extremities. No change in bowel or bladder control.  MUSCULOSKELETAL: No muscle, back pain, joint pain or stiffness.  LYMPHATICS: No enlarged nodes. No history of splenectomy.  PSYCHIATRIC: No history of depression or anxiety.  ENDOCRINOLOGIC: No reports of sweating, cold or heat intolerance. No polyuria or polydipsia.  Marland Kitchen   Physical Examination Vitals:   08/28/17 0914  BP: 139/86  Pulse: 74  SpO2: 97%   Vitals:   08/28/17 0914  Weight: 168 lb (76.2 kg)  Height: 5\' 9"  (1.753 m)    Gen: resting comfortably, no acute distress HEENT: no scleral icterus, pupils equal round and reactive, no palptable cervical adenopathy,  CV: RRR, 2/6 systolic murmur rusb, no jvd Resp: Clear to auscultation bilaterally GI: abdomen is soft, non-tender, non-distended, normal bowel sounds, no hepatosplenomegaly MSK: extremities are warm, no edema.  Skin: warm, no rash Neuro:  no focal deficits Psych: appropriate affect   Diagnostic Studies 03/2017 echo Study Conclusions  - Left ventricle: The cavity size was normal. Wall thickness was increased in a pattern of moderate LVH. Systolic function was moderately reduced. The estimated ejection fraction was in the range of 35% to 40%. Diffuse hypokinesis. Doppler parameters are consistent with abnormal left ventricular relaxation (grade 1 diastolic dysfunction). Doppler parameters are consistent with high ventricular filling pressure. - Aortic valve: Severely calcified annulus. Severely thickened leaflets. There was severe stenosis. Mean gradient (S): 51 mm Hg. VTI ratio of LVOT to aortic valve: 0.11. Valve area (VTI): 0.39 cm^2. Valve area (Vmax): 0.4 cm^2. Valve area (Vmean): 0.43 cm^2. - Left atrium: The atrium was mildly dilated. - Technically adequate  study.  03/2017 Heart cath  Prox RCA lesion is 10% stenosed.  Ost 2nd Mrg to 2nd Mrg lesion is 10% stenosed.  Prox LAD to Mid LAD lesion is 20% stenosed.  1. Mild non-obstructive CAD 2. Severe aortic stenosis by echo (unable to cross the aortic valve from the radial approach).   Recommendations: He will need referral to CT surgery to discuss surgical AVR. He is low risk for surgery and will not be a candidate for TAVR if his surgical risk is felt to be low.      Assessment and Plan  1. Aortic stenosis/bicuspid aortic valve - s/p AVR, doing well without symptoms - continue to monitor.   2. Chronic systolic HF - LVEF has normalized s/p AVR - he is anxious to decrease and possible stop some of his meds. Due to recent systolic dysfunction would like to keep beta blocker and ACE-I on board, lower Toprol to 50mg  daily  3. Postop afib - off amio, no symptomatic recurrence.   F/u 6 months       Valicia Rief F.  Harl Bowie, M.D.

## 2017-08-28 NOTE — Patient Instructions (Signed)
Your physician wants you to follow-up in: [place] will receive a reminder letter in the mail two months in advance. If you don't receive a letter, please call our office to schedule the follow-up appointment.  Your physician has recommended you make the following change in your medication:   DECREASE ASPIRIN 81 MG DAILY  DECREASE TOPROL XL 50 MG DAILY  Thank you for choosing [facility]!!

## 2017-09-04 ENCOUNTER — Encounter: Payer: Self-pay | Admitting: Cardiology

## 2017-10-28 ENCOUNTER — Other Ambulatory Visit: Payer: Self-pay | Admitting: Cardiology

## 2018-02-15 DIAGNOSIS — H2511 Age-related nuclear cataract, right eye: Secondary | ICD-10-CM | POA: Diagnosis not present

## 2018-02-15 DIAGNOSIS — H25811 Combined forms of age-related cataract, right eye: Secondary | ICD-10-CM | POA: Diagnosis not present

## 2018-02-19 DIAGNOSIS — Z23 Encounter for immunization: Secondary | ICD-10-CM | POA: Diagnosis not present

## 2018-03-01 DIAGNOSIS — H2512 Age-related nuclear cataract, left eye: Secondary | ICD-10-CM | POA: Diagnosis not present

## 2018-03-01 DIAGNOSIS — H25812 Combined forms of age-related cataract, left eye: Secondary | ICD-10-CM | POA: Diagnosis not present

## 2018-03-26 ENCOUNTER — Encounter: Payer: Self-pay | Admitting: *Deleted

## 2018-03-27 ENCOUNTER — Encounter: Payer: Self-pay | Admitting: *Deleted

## 2018-03-27 ENCOUNTER — Ambulatory Visit (INDEPENDENT_AMBULATORY_CARE_PROVIDER_SITE_OTHER): Payer: Medicare Other | Admitting: Cardiology

## 2018-03-27 VITALS — BP 146/79 | HR 83 | Ht 69.0 in | Wt 168.0 lb

## 2018-03-27 DIAGNOSIS — Z952 Presence of prosthetic heart valve: Secondary | ICD-10-CM

## 2018-03-27 DIAGNOSIS — I350 Nonrheumatic aortic (valve) stenosis: Secondary | ICD-10-CM | POA: Diagnosis not present

## 2018-03-27 DIAGNOSIS — I10 Essential (primary) hypertension: Secondary | ICD-10-CM | POA: Diagnosis not present

## 2018-03-27 NOTE — Progress Notes (Signed)
Clinical Summary Jeremy Larson is a 67 y.o.male seen today for follow up of the following medical problems.  1. Aortic stenosis/Bicuspid aortic valve - probable bicuspid AV by prior echo.  - echo 01/2010 LVEF 60-65%, bicuspid AV mod AS mean grad 34 and AVA VTI 1.12 03/2017 echo: LVEF 01-74%, grade I diastolic dysfunction, AV mean grad 51, AVA VTI 0.39.  03/2017 cath: mild nonsobstructive CAD. Normal filling pressures by RHC 03/2017 CTA Chest/Abd/Pelvis: no aortic pathology  - s/p AVR with 23 mm [facility] Ease pericardial valve Jan 2019 - f/u echo 07/2017 shows LVEF 94-49%, grade I diastolic dysfunction, normal functioning AVR. -no recent symptoms - compliant with meds. Walks around mall regularly without troubles.   - on Toprol 25, lisinopril 2.5. He lowered his TOprol on his own, he has been pretty motivated to get on lower doses of medications.    2. Postoperative afib - s/p AVR. Has been on amiodarone.  - no recent palpitations   Has upcoming physical with pcp with labs.   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   Past Medical History:  Diagnosis Date  . Aortic stenosis    a. s/p AVR on 05/18/2017 with 23 mm Edwards Magna-Ease Pericardial valve  . Chronic systolic (congestive) heart failure ([place])    a. 05/2017: TEE showing EF 40-45%  . Dyspnea      No Known Allergies   Current Outpatient Medications  Medication Sig Dispense Refill  . aspirin EC 81 MG tablet Take 81 mg by mouth daily.    Marland Kitchen lisinopril (PRINIVIL,ZESTRIL) 2.5 MG tablet Take 2.5 mg by mouth daily.    Marland Kitchen lisinopril (PRINIVIL,ZESTRIL) 2.5 MG tablet TAKE ONE TABLET BY MOUTH DAILY. 90 tablet 0  . loratadine (CLARITIN) 10 MG tablet Take 10 mg daily by mouth.    . metoprolol succinate (TOPROL-XL) 50 MG 24 hr tablet Take 1 tablet (50 mg total) by mouth daily. Take with or immediately following a meal. 90 tablet 1   No current  facility-administered medications for this visit.      Past Surgical History:  Procedure Laterality Date  . AORTIC VALVE REPLACEMENT N/A 05/18/2017   Procedure: AORTIC VALVE REPLACEMENT (AVR),TEE;  Surgeon: Gaye Pollack, MD;  Location: [place];  Service: Open Heart Surgery;  Laterality: N/A;  . HAND SURGERY     left hang following trauma  . RIGHT/LEFT HEART CATH AND CORONARY ANGIOGRAPHY N/A 04/12/2017   Procedure: RIGHT/LEFT HEART CATH AND CORONARY ANGIOGRAPHY;  Surgeon: Burnell Blanks, MD;  Location: [place] [facility];  Service: Cardiovascular;  Laterality: N/A;  . TEE WITHOUT CARDIOVERSION N/A 05/18/2017   Procedure: TRANSESOPHAGEAL ECHOCARDIOGRAM (TEE);  Surgeon: Gaye Pollack, MD;  Location: [place];  Service: Open Heart Surgery;  Laterality: N/A;     No Known Allergies    Family History  Problem Relation Age of Onset  . Lung cancer Unknown        brother and father  . Heart murmur Unknown        mother  . Cancer Mother   . Heart failure Mother   . Cancer Father   . Cancer Brother   . Cirrhosis Brother   . Alcohol abuse Brother      Social History [name] reports that he has quit smoking. His smoking use included cigarettes. He has a 10.00 pack-year smoking history. He has quit using smokeless tobacco. [name] reports that  he does not drink alcohol.   Review of Systems CONSTITUTIONAL: No weight loss, fever, chills, weakness or fatigue.  HEENT: Eyes: No visual loss, blurred vision, double vision or yellow sclerae.No hearing loss, sneezing, congestion, runny nose or sore throat.  SKIN: No rash or itching.  CARDIOVASCULAR: per hpi RESPIRATORY: No shortness of breath, cough or sputum.  GASTROINTESTINAL: No anorexia, nausea, vomiting or diarrhea. No abdominal pain or blood.  GENITOURINARY: No burning on urination, no polyuria NEUROLOGICAL: No headache, dizziness, syncope, paralysis, ataxia, numbness or tingling in the extremities. No change in bowel or  bladder control.  MUSCULOSKELETAL: No muscle, back pain, joint pain or stiffness.  LYMPHATICS: No enlarged nodes. No history of splenectomy.  PSYCHIATRIC: No history of depression or anxiety.  ENDOCRINOLOGIC: No reports of sweating, cold or heat intolerance. No polyuria or polydipsia.  Marland Kitchen   Physical Examination Vitals:   03/27/18 0819  BP: (!) 146/79  Pulse: 83   Vitals:   03/27/18 0819  Weight: 168 lb (76.2 kg)  Height: 5\' 9"  (1.753 m)    Gen: resting comfortably, no acute distress HEENT: no scleral icterus, pupils equal round and reactive, no palptable cervical adenopathy,  CV: RRR, 2/6 systolic murmur rusb, no jvd Resp: Clear to auscultation bilaterally GI: abdomen is soft, non-tender, non-distended, normal bowel sounds, no hepatosplenomegaly MSK: extremities are warm, no edema.  Skin: warm, no rash Neuro:  no focal deficits Psych: appropriate affect   Diagnostic Studies 03/2017 echo Study Conclusions  - Left ventricle: The cavity size was normal. Wall thickness was increased in a pattern of moderate LVH. Systolic function was moderately reduced. The estimated ejection fraction was in the range of 35% to 40%. Diffuse hypokinesis. Doppler parameters are consistent with abnormal left ventricular relaxation (grade 1 diastolic dysfunction). Doppler parameters are consistent with high ventricular filling pressure. - Aortic valve: Severely calcified annulus. Severely thickened leaflets. There was severe stenosis. Mean gradient (S): 51 mm Hg. VTI ratio of LVOT to aortic valve: 0.11. Valve area (VTI): 0.39 cm^2. Valve area (Vmax): 0.4 cm^2. Valve area (Vmean): 0.43 cm^2. - Left atrium: The atrium was mildly dilated. - Technically adequate study.  03/2017 Heart cath  Prox RCA lesion is 10% stenosed.  Ost 2nd Mrg to 2nd Mrg lesion is 10% stenosed.  Prox LAD to Mid LAD lesion is 20% stenosed.  1. Mild non-obstructive CAD 2. Severe aortic stenosis  by echo (unable to cross the aortic valve from the radial approach).   Recommendations: He will need referral to CT surgery to discuss surgical AVR. He is low risk for surgery and will not be a candidate for TAVR if his surgical risk is felt to be low.      Assessment and Plan  1. Aortic stenosis/bicuspid aortic valve -s/p AVR, continues to do well almost 1 year out. LVEF normalized after surgery - no changes today.    2. HTN - elevated in clinic, home numbers her reports are 130s/80s. Ive asked him to submit a bp log in 1 week. He has been pretty motivated to come off some of his meds and lowered his Toprol on his own to 25mg  daily. If bp's above goal would increase lisinopril    F/u 1 year       Arnoldo Lenis, M.D.

## 2018-03-27 NOTE — Patient Instructions (Signed)
Your physician wants you to follow-up in: [place] will receive a reminder letter in the mail two months in advance. If you don't receive a letter, please call our office to schedule the follow-up appointment.  Your physician recommends that you continue on your current medications as directed. Please refer to the Current Medication list given to you today.  Your physician has requested that you regularly monitor and record your blood pressure readings at home FOR 1 [place]. Please use the same machine at the same time of day to check your readings   Thank you for choosing [facility]!!

## 2018-05-05 ENCOUNTER — Other Ambulatory Visit: Payer: Self-pay | Admitting: Cardiology

## 2018-05-15 ENCOUNTER — Other Ambulatory Visit: Payer: Self-pay | Admitting: Cardiology

## 2018-08-15 ENCOUNTER — Other Ambulatory Visit: Payer: Self-pay

## 2018-08-15 MED ORDER — METOPROLOL SUCCINATE ER 50 MG PO TB24: 50.0000 mg | ORAL_TABLET | Freq: Every day | ORAL | 3 refills | Status: DC

## 2018-08-15 NOTE — Telephone Encounter (Signed)
Refilled metoprolol 50 mg qd per fax request

## 2018-09-14 DIAGNOSIS — Z7689 Persons encountering health services in other specified circumstances: Secondary | ICD-10-CM | POA: Diagnosis not present

## 2018-09-14 DIAGNOSIS — I10 Essential (primary) hypertension: Secondary | ICD-10-CM | POA: Diagnosis not present

## 2018-09-14 DIAGNOSIS — E039 Hypothyroidism, unspecified: Secondary | ICD-10-CM | POA: Diagnosis not present

## 2018-09-14 DIAGNOSIS — Z23 Encounter for immunization: Secondary | ICD-10-CM | POA: Diagnosis not present

## 2018-09-14 DIAGNOSIS — Z0001 Encounter for general adult medical examination with abnormal findings: Secondary | ICD-10-CM | POA: Diagnosis not present

## 2018-09-14 DIAGNOSIS — R5383 Other fatigue: Secondary | ICD-10-CM | POA: Diagnosis not present

## 2018-09-14 DIAGNOSIS — E782 Mixed hyperlipidemia: Secondary | ICD-10-CM | POA: Diagnosis not present

## 2018-10-31 ENCOUNTER — Other Ambulatory Visit: Payer: Self-pay | Admitting: Cardiology

## 2019-02-12 DIAGNOSIS — Z23 Encounter for immunization: Secondary | ICD-10-CM | POA: Diagnosis not present

## 2019-02-12 DIAGNOSIS — Z0001 Encounter for general adult medical examination with abnormal findings: Secondary | ICD-10-CM | POA: Diagnosis not present

## 2019-02-12 DIAGNOSIS — E782 Mixed hyperlipidemia: Secondary | ICD-10-CM | POA: Diagnosis not present

## 2019-02-12 DIAGNOSIS — R011 Cardiac murmur, unspecified: Secondary | ICD-10-CM | POA: Diagnosis not present

## 2019-02-12 DIAGNOSIS — I10 Essential (primary) hypertension: Secondary | ICD-10-CM | POA: Diagnosis not present

## 2019-02-12 DIAGNOSIS — I350 Nonrheumatic aortic (valve) stenosis: Secondary | ICD-10-CM | POA: Diagnosis not present

## 2019-05-13 ENCOUNTER — Other Ambulatory Visit: Payer: Self-pay | Admitting: Cardiology

## 2019-05-16 DIAGNOSIS — S0502XA Injury of conjunctiva and corneal abrasion without foreign body, left eye, initial encounter: Secondary | ICD-10-CM | POA: Diagnosis not present

## 2019-05-16 DIAGNOSIS — H179 Unspecified corneal scar and opacity: Secondary | ICD-10-CM | POA: Diagnosis not present

## 2019-07-27 ENCOUNTER — Other Ambulatory Visit: Payer: Self-pay | Admitting: Cardiology

## 2019-09-10 IMAGING — DX DG CHEST 1V PORT
1 series · 1 of 1 positions shown · non-contrast
Comparison: 05/15/2017

CLINICAL DATA: Status post aortic valve replacement

EXAM:
PORTABLE CHEST 1 VIEW

[chest ap]
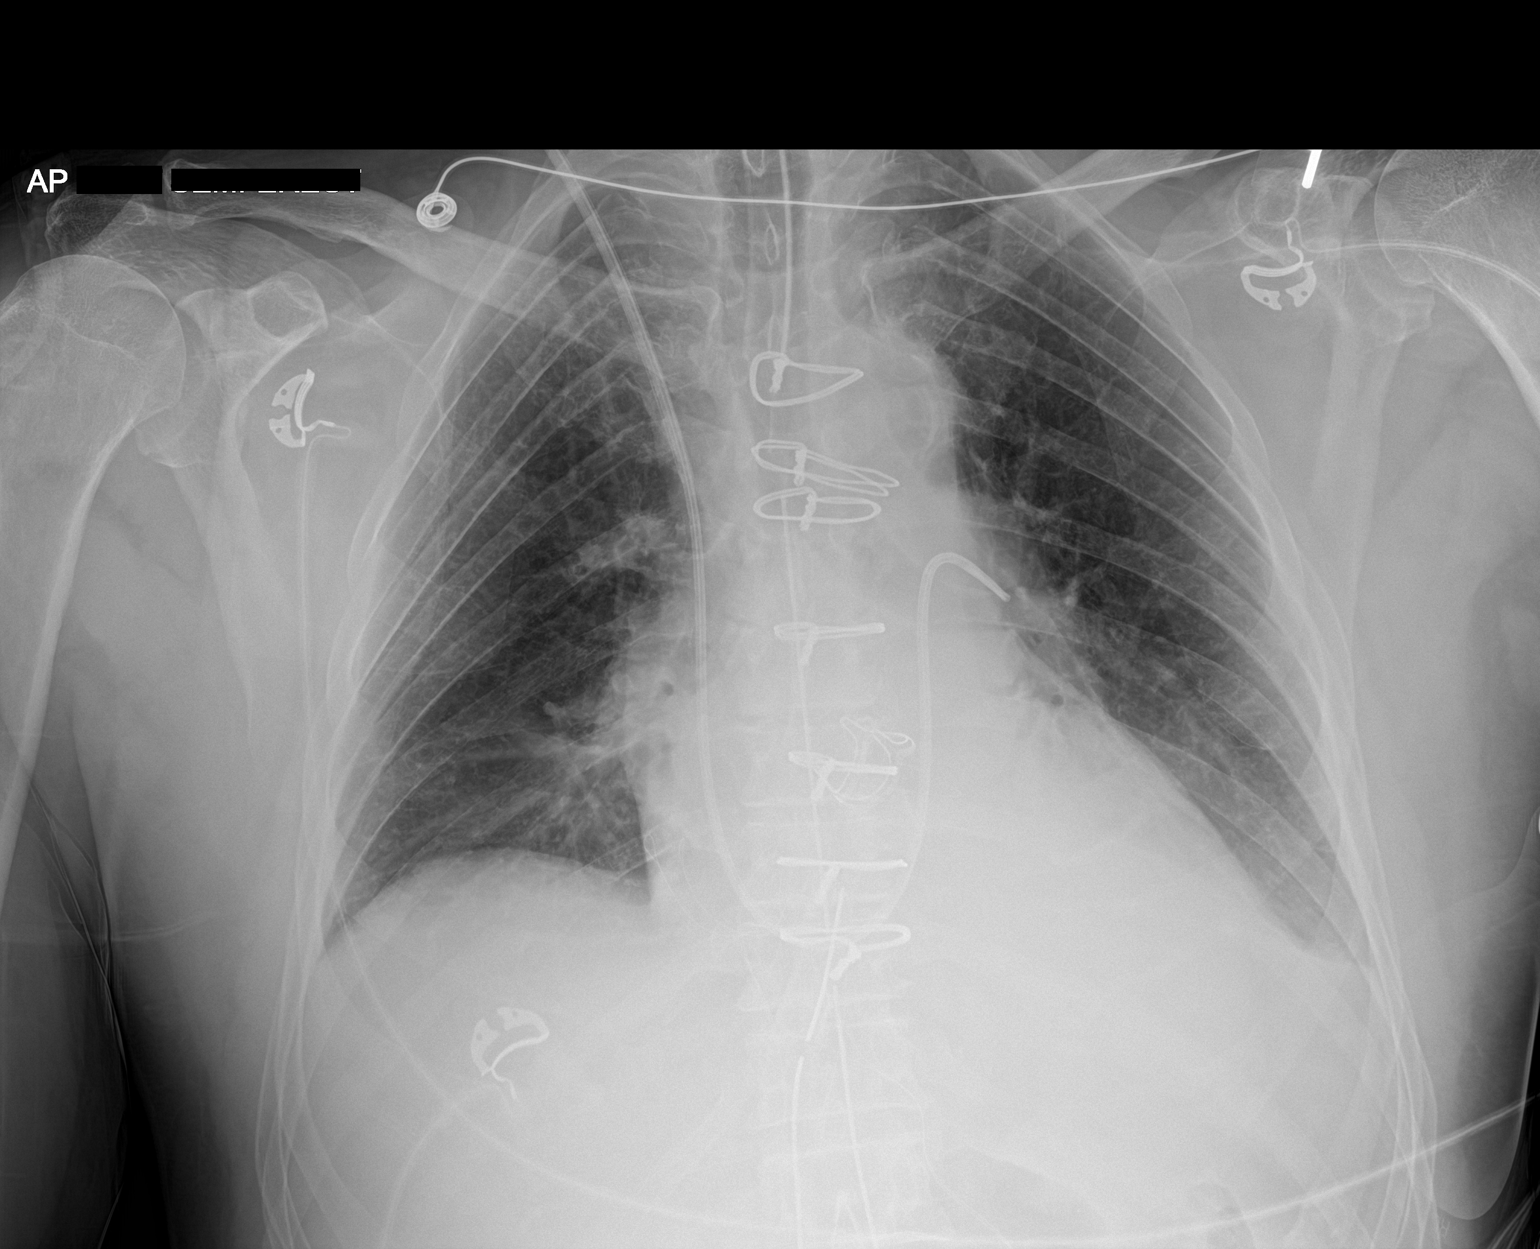

[1 of 1 positions shown; findings below may reference images not displayed]

FINDINGS: Postsurgical changes are now seen. Mediastinal drain and pericardial
drain are noted. Swan-Ganz catheter is noted the left pulmonary
artery. Endotracheal tube is noted in satisfactory per. Lungs are
well aerated bilaterally. Minimal left retrocardiac atelectasis is
seen. No pneumothorax or sizable effusion is noted.
IMPRESSION: Tubes and lines as described.

Mild left retrocardiac atelectasis.

## 2019-09-30 DIAGNOSIS — D485 Neoplasm of uncertain behavior of skin: Secondary | ICD-10-CM | POA: Diagnosis not present

## 2019-09-30 DIAGNOSIS — L821 Other seborrheic keratosis: Secondary | ICD-10-CM | POA: Diagnosis not present

## 2019-09-30 DIAGNOSIS — D225 Melanocytic nevi of trunk: Secondary | ICD-10-CM | POA: Diagnosis not present

## 2019-10-14 IMAGING — DX DG CHEST 2V
2 series · 2 of 2 positions shown · non-contrast
Comparison: Radiographs May 21, 2017.

CLINICAL DATA: Status post aortic valve replacement.

EXAM:
CHEST  2 VIEW

[dg chest 2 view (1 of 2)]
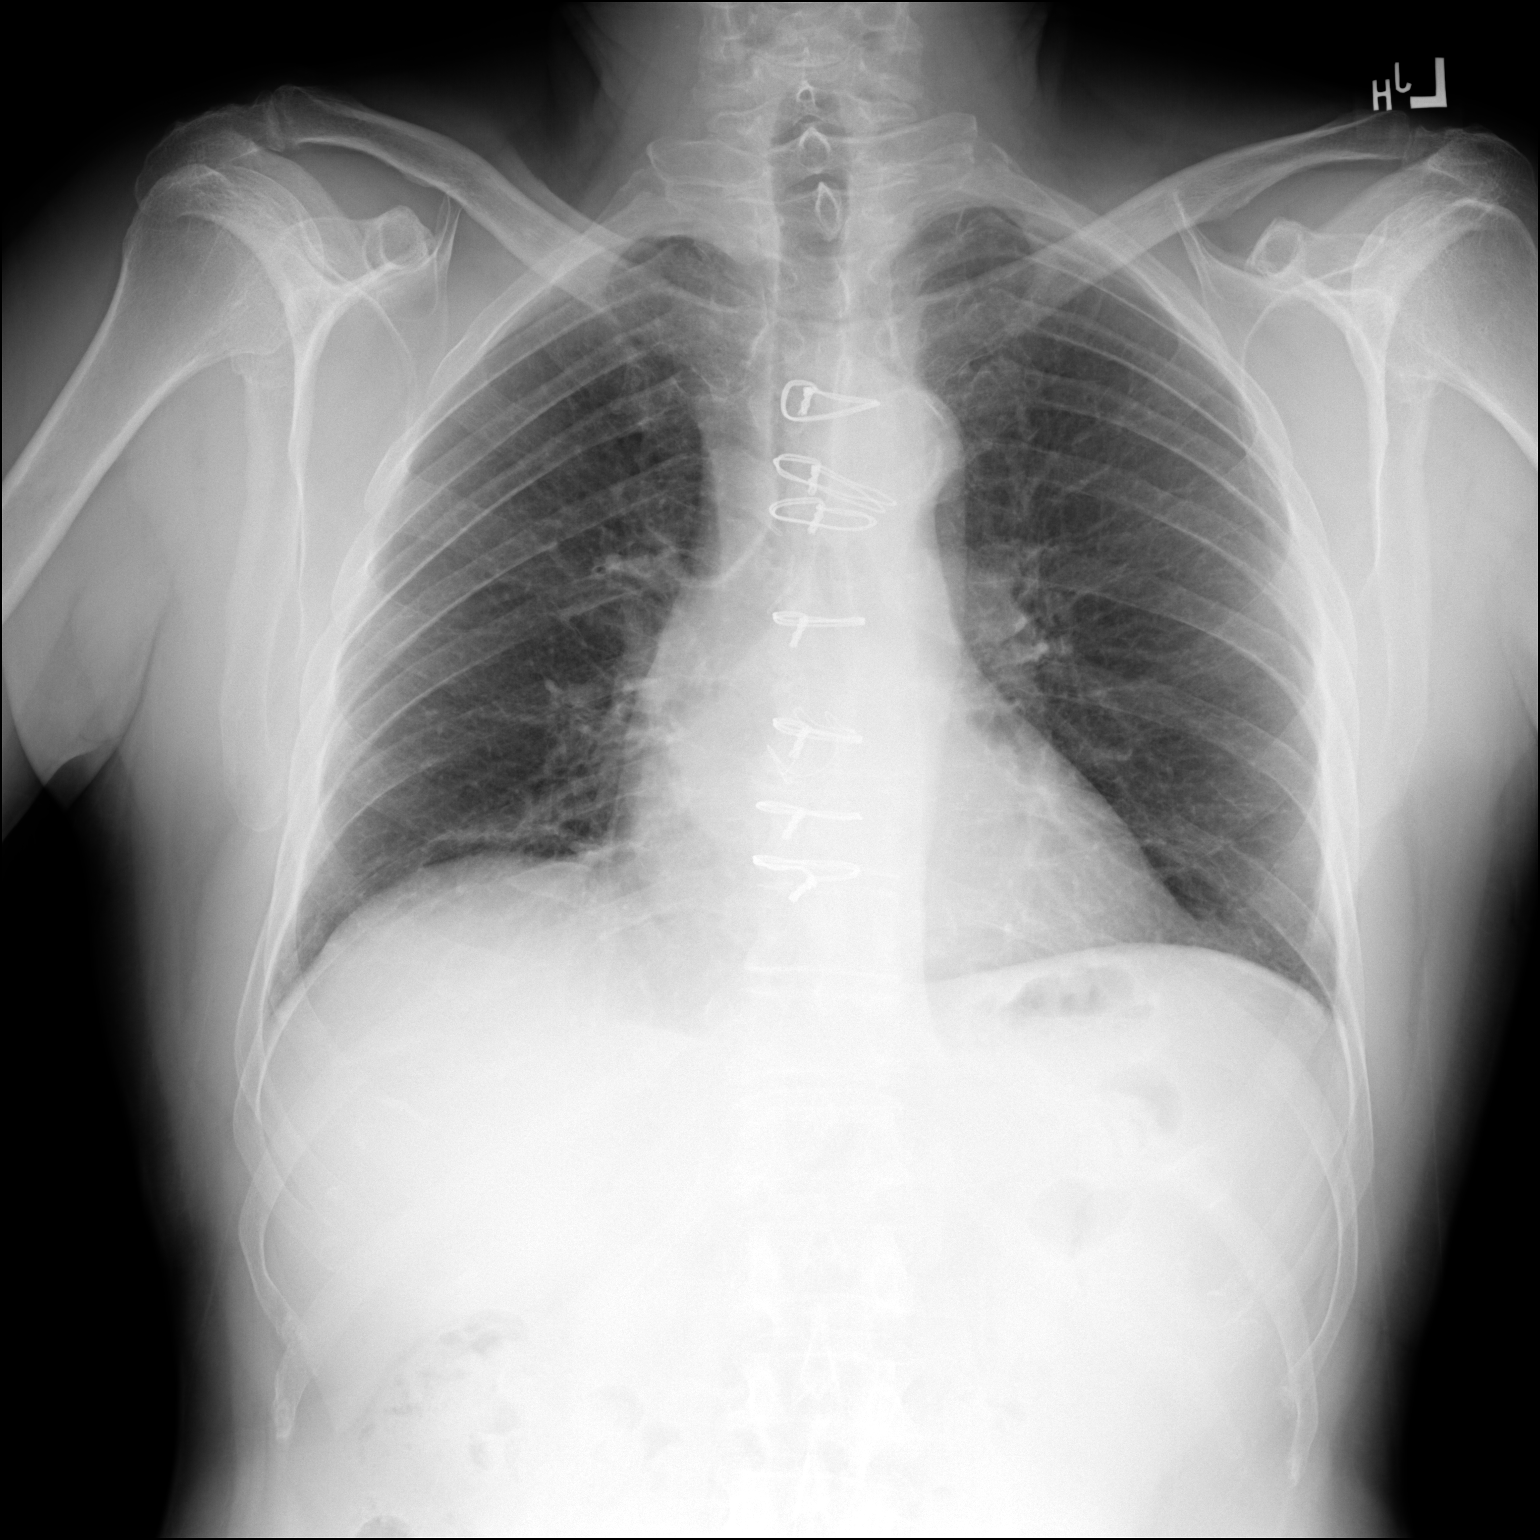

[dg chest 2 view (2 of 2)]
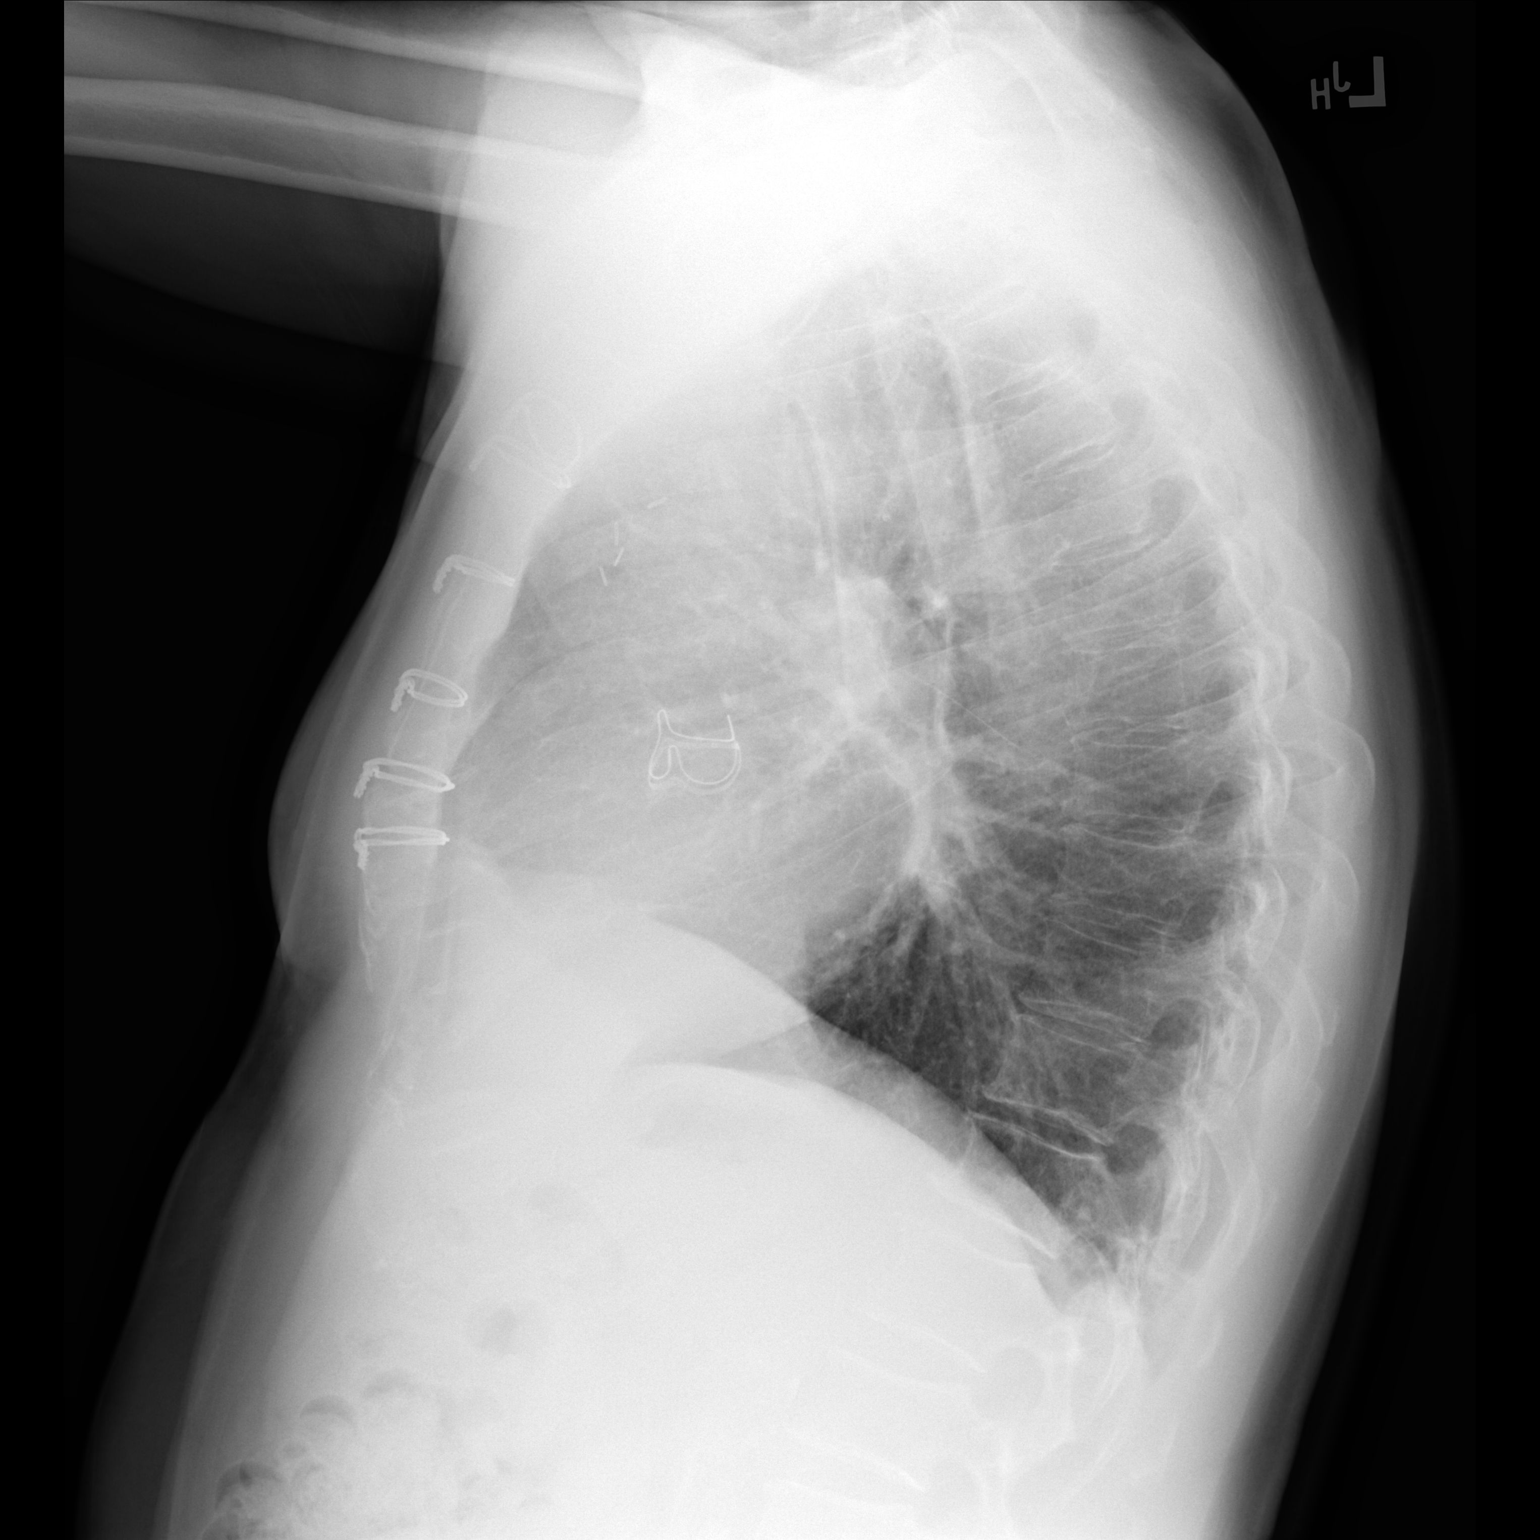

[2 of 2 positions shown; findings below may reference images not displayed]

FINDINGS: The heart size and mediastinal contours are within normal limits.
Aortic valve prosthesis is noted. Atherosclerosis of thoracic aorta
is noted. Left lung is clear. Mild right basilar subsegmental
atelectasis is noted. No pneumothorax or pleural effusion is noted..
The visualized skeletal structures are unremarkable.
IMPRESSION: Mild right basilar subsegmental atelectasis. Aortic atherosclerosis.

## 2019-11-05 ENCOUNTER — Other Ambulatory Visit: Payer: Self-pay | Admitting: Cardiology

## 2019-11-22 ENCOUNTER — Encounter: Payer: Self-pay | Admitting: Cardiology

## 2019-11-22 ENCOUNTER — Ambulatory Visit (INDEPENDENT_AMBULATORY_CARE_PROVIDER_SITE_OTHER): Payer: Medicare Other | Admitting: Cardiology

## 2019-11-22 ENCOUNTER — Encounter: Payer: Self-pay | Admitting: *Deleted

## 2019-11-22 ENCOUNTER — Other Ambulatory Visit: Payer: Self-pay

## 2019-11-22 VITALS — BP 138/74 | HR 71 | Ht 69.0 in | Wt 168.0 lb

## 2019-11-22 DIAGNOSIS — I10 Essential (primary) hypertension: Secondary | ICD-10-CM

## 2019-11-22 DIAGNOSIS — Z952 Presence of prosthetic heart valve: Secondary | ICD-10-CM

## 2019-11-22 DIAGNOSIS — I350 Nonrheumatic aortic (valve) stenosis: Secondary | ICD-10-CM

## 2019-11-22 NOTE — Progress Notes (Signed)
Clinical Summary Mr. Deckman is a 69 y.o.male seen today for follow up of the following medical problems.  1. Aortic stenosis/Bicuspid aortic valve - probable bicuspid AV by prior echo.  - echo 01/2010 LVEF 60-65%, bicuspid AV mod AS mean grad 34 and AVA VTI 1.12 03/2017 echo: LVEF 19-50%, grade I diastolic dysfunction, AV mean grad 51, AVA VTI 0.39.  03/2017 cath: mild nonsobstructive CAD. Normal filling pressures by RHC 03/2017 CTA Chest/Abd/Pelvis: no aortic pathology  -s/p surgical AVR with 23 mm [facility] Ease pericardial valve Jan 2019 - f/u echo 07/2017 shows LVEF 93-26%, grade I diastolic dysfunction, normal functioning AVR.     - no recent SOB/DOE. 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  Past Medical History:  Diagnosis Date  . Aortic stenosis    a. s/p AVR on 05/18/2017 with 23 mm Edwards Magna-Ease Pericardial valve  . Chronic systolic (congestive) heart failure ([place])    a. 05/2017: TEE showing EF 40-45%  . Dyspnea      No Known Allergies   Current Outpatient Medications  Medication Sig Dispense Refill  . aspirin EC 81 MG tablet Take 81 mg by mouth daily.    Marland Kitchen lisinopril (ZESTRIL) 2.5 MG tablet TAKE ONE TABLET BY MOUTH DAILY. NEEDS TO MAKE APPOINTMENT WITH DOCTOR. 30 tablet 0  . loratadine (CLARITIN) 10 MG tablet Take 10 mg daily by mouth.    . metoprolol succinate (TOPROL-XL) 50 MG 24 hr tablet TAKE ONE TABLET BY MOUTH DAILY. TAKE WITH OR IMMEDIATELY FOLLOWING A MEAL. 90 tablet 3  . Multiple Vitamin (MULTIVITAMIN) tablet Take 1 tablet by mouth daily.     No current facility-administered medications for this visit.     Past Surgical History:  Procedure Laterality Date  . AORTIC VALVE REPLACEMENT N/A 05/18/2017   Procedure: AORTIC VALVE REPLACEMENT (AVR),TEE;  Surgeon: Gaye Pollack, MD;  Location: [place];  Service:  Open Heart Surgery;  Laterality: N/A;  . HAND SURGERY     left hang following trauma  . RIGHT/LEFT HEART CATH AND CORONARY ANGIOGRAPHY N/A 04/12/2017   Procedure: RIGHT/LEFT HEART CATH AND CORONARY ANGIOGRAPHY;  Surgeon: Burnell Blanks, MD;  Location: [place] [facility];  Service: Cardiovascular;  Laterality: N/A;  . TEE WITHOUT CARDIOVERSION N/A 05/18/2017   Procedure: TRANSESOPHAGEAL ECHOCARDIOGRAM (TEE);  Surgeon: Gaye Pollack, MD;  Location: [place];  Service: Open Heart Surgery;  Laterality: N/A;     No Known Allergies    Family History  Problem Relation Age of Onset  . Lung cancer Unknown        brother and father  . Heart murmur Unknown        mother  . Cancer Mother   . Heart failure Mother   . Cancer Father   . Cancer Brother   . Cirrhosis Brother   . Alcohol abuse Brother      Social History Mr. Sara reports that he has quit smoking. His smoking use included cigarettes. He has a 10.00 pack-year smoking history. He has quit using smokeless tobacco. Mr. Lotts reports no history of alcohol use.   Review of Systems CONSTITUTIONAL: No weight loss, fever, chills, weakness or fatigue.  HEENT: Eyes: No visual loss, blurred vision, double vision or yellow sclerae.No hearing loss, sneezing, congestion, runny nose or sore throat.  SKIN: No rash or  itching.  CARDIOVASCULAR: per hpi RESPIRATORY: No shortness of breath, cough or sputum.  GASTROINTESTINAL: No anorexia, nausea, vomiting or diarrhea. No abdominal pain or blood.  GENITOURINARY: No burning on urination, no polyuria NEUROLOGICAL: No headache, dizziness, syncope, paralysis, ataxia, numbness or tingling in the extremities. No change in bowel or bladder control.  MUSCULOSKELETAL: No muscle, back pain, joint pain or stiffness.  LYMPHATICS: No enlarged nodes. No history of splenectomy.  PSYCHIATRIC: No history of depression or anxiety.  ENDOCRINOLOGIC: No reports of sweating, cold or heat intolerance. No  polyuria or polydipsia.  Marland Kitchen   Physical Examination Today's Vitals   11/22/19 1354  BP: 138/74  Pulse: 71  SpO2: 98%  Weight: 168 lb (76.2 kg)  Height: 5\' 9"  (1.753 m)   Body mass index is 24.81 kg/m.  Gen: resting comfortably, no acute distress HEENT: no scleral icterus, pupils equal round and reactive, no palptable cervical adenopathy,  CV: RRR, 2/6 systolic murmur rusb, no jvd Resp: Clear to auscultation bilaterally GI: abdomen is soft, non-tender, non-distended, normal bowel sounds, no hepatosplenomegaly MSK: extremities are warm, no edema.  Skin: warm, no rash Neuro:  no focal deficits Psych: appropriate affect   Diagnostic Studies 03/2017 echo Study Conclusions  - Left ventricle: The cavity size was normal. Wall thickness was increased in a pattern of moderate LVH. Systolic function was moderately reduced. The estimated ejection fraction was in the range of 35% to 40%. Diffuse hypokinesis. Doppler parameters are consistent with abnormal left ventricular relaxation (grade 1 diastolic dysfunction). Doppler parameters are consistent with high ventricular filling pressure. - Aortic valve: Severely calcified annulus. Severely thickened leaflets. There was severe stenosis. Mean gradient (S): 51 mm Hg. VTI ratio of LVOT to aortic valve: 0.11. Valve area (VTI): 0.39 cm^2. Valve area (Vmax): 0.4 cm^2. Valve area (Vmean): 0.43 cm^2. - Left atrium: The atrium was mildly dilated. - Technically adequate study.  03/2017 Heart cath  Prox RCA lesion is 10% stenosed.  Ost 2nd Mrg to 2nd Mrg lesion is 10% stenosed.  Prox LAD to Mid LAD lesion is 20% stenosed.  1. Mild non-obstructive CAD 2. Severe aortic stenosis by echo (unable to cross the aortic valve from the radial approach).   Recommendations: He will need referral to CT surgery to discuss surgical AVR. He is low risk for surgery and will not be a candidate for TAVR if his surgical risk is felt to  be low.    Assessment and Plan   1. Aortic stenosis/bicuspid aortic valve -s/p surigcal AVR - doing well, continue to monitor    2. HTN -reasonable bp's today, continue current meds   EKG today shows NSR, no ischemic changes  F/u 1 year   Arnoldo Lenis, M.D.

## 2019-11-22 NOTE — Patient Instructions (Signed)

## 2019-12-04 ENCOUNTER — Other Ambulatory Visit: Payer: Self-pay | Admitting: Cardiology

## 2020-03-09 DIAGNOSIS — M79605 Pain in left leg: Secondary | ICD-10-CM | POA: Diagnosis not present

## 2020-03-09 DIAGNOSIS — K2100 Gastro-esophageal reflux disease with esophagitis, without bleeding: Secondary | ICD-10-CM | POA: Diagnosis not present

## 2020-03-09 DIAGNOSIS — Z23 Encounter for immunization: Secondary | ICD-10-CM | POA: Diagnosis not present

## 2020-04-02 ENCOUNTER — Other Ambulatory Visit: Payer: Self-pay | Admitting: Cardiology

## 2020-05-06 ENCOUNTER — Other Ambulatory Visit: Payer: Self-pay | Admitting: Cardiology

## 2020-05-19 DIAGNOSIS — Z23 Encounter for immunization: Secondary | ICD-10-CM | POA: Diagnosis not present

## 2020-05-19 DIAGNOSIS — I10 Essential (primary) hypertension: Secondary | ICD-10-CM | POA: Diagnosis not present

## 2020-05-19 DIAGNOSIS — E782 Mixed hyperlipidemia: Secondary | ICD-10-CM | POA: Diagnosis not present

## 2020-05-19 DIAGNOSIS — Z0001 Encounter for general adult medical examination with abnormal findings: Secondary | ICD-10-CM | POA: Diagnosis not present

## 2020-05-19 DIAGNOSIS — K2100 Gastro-esophageal reflux disease with esophagitis, without bleeding: Secondary | ICD-10-CM | POA: Diagnosis not present

## 2020-06-15 DIAGNOSIS — Z1212 Encounter for screening for malignant neoplasm of rectum: Secondary | ICD-10-CM | POA: Diagnosis not present

## 2020-06-15 DIAGNOSIS — Z1211 Encounter for screening for malignant neoplasm of colon: Secondary | ICD-10-CM | POA: Diagnosis not present

## 2020-06-23 LAB — COLOGUARD: COLOGUARD: POSITIVE — AB

## 2020-06-29 ENCOUNTER — Encounter (INDEPENDENT_AMBULATORY_CARE_PROVIDER_SITE_OTHER): Payer: Self-pay | Admitting: *Deleted

## 2020-08-03 ENCOUNTER — Other Ambulatory Visit: Payer: Self-pay | Admitting: Cardiology

## 2020-08-04 ENCOUNTER — Telehealth: Payer: Self-pay | Admitting: Cardiology

## 2020-08-04 MED ORDER — METOPROLOL SUCCINATE ER 25 MG PO TB24: 25.0000 mg | ORAL_TABLET | Freq: Every day | ORAL | 1 refills | Status: DC

## 2020-08-04 NOTE — Telephone Encounter (Signed)
   1. Which medications need to be refilled? (please list name of each medication and dose if known)   METOPROLOL 25 MG    2. Which pharmacy/location (including street and city if local pharmacy) is medication to be sent to?  [facility]  3. Do they need a 30 day or 90 day supply?   90   Patient states that he is completely out of medication.

## 2020-08-04 NOTE — Telephone Encounter (Signed)
Medication sent to pharmacy  

## 2020-09-02 ENCOUNTER — Encounter (INDEPENDENT_AMBULATORY_CARE_PROVIDER_SITE_OTHER): Payer: Self-pay | Admitting: Gastroenterology

## 2020-09-02 ENCOUNTER — Other Ambulatory Visit: Payer: Self-pay

## 2020-09-02 ENCOUNTER — Ambulatory Visit (INDEPENDENT_AMBULATORY_CARE_PROVIDER_SITE_OTHER): Payer: Medicare Other | Admitting: Gastroenterology

## 2020-09-02 DIAGNOSIS — K219 Gastro-esophageal reflux disease without esophagitis: Secondary | ICD-10-CM

## 2020-09-02 DIAGNOSIS — R195 Other fecal abnormalities: Secondary | ICD-10-CM | POA: Insufficient documentation

## 2020-09-02 NOTE — H&P (View-Only) (Signed)
Jeremy Larson, M.D. Gastroenterology & Hepatology [facility] [address] [place], [place] [address] Primary Care Physician: Caryl Bis, MD [place] [address]  Referring MD: PCP  Chief Complaint: Positive Cologuard  History of Present Illness: Jeremy Larson is a 70 y.o. male with past medical history of aortic stenosis status post aortic valve replacement in 5364, chronic systolic heart failure (most recent ejection fraction 60-65%) who presents for evaluation of positive Cologuard.  Patient had a positive Cologuard testing on 06/23/2020 and was referred to gastroenterology. Denies any complaints. The patient denies having any nausea, vomiting, fever, chills, hematochezia, melena, hematemesis, abdominal distention, abdominal pain, diarrhea, jaundice, pruritus or weight loss.  3-4 months ago he had some dysphagia to solids. Was advised by his PCP to take Protonix daily and reports since he started the medications his dysphagia has resolved.  He has not had any heartburn or odynophagia.  Has been taking the medication compliantly.  Last WOE:HOZYY Last Colonoscopy:never  FHx: neg for any gastrointestinal/liver disease, melanoma son, lung cancer father Social: neg smoking, alcohol or illicit drug use Surgical: no abdominal surgeries  Past Medical History: Past Medical History:  Diagnosis Date  . Aortic stenosis    a. s/p AVR on 05/18/2017 with 23 mm Edwards Magna-Ease Pericardial valve  . Chronic systolic (congestive) heart failure ([place])    a. 05/2017: TEE showing EF 40-45%  . Dyspnea     Past Surgical History: Past Surgical History:  Procedure Laterality Date  . AORTIC VALVE REPLACEMENT N/A 05/18/2017   Procedure: AORTIC VALVE REPLACEMENT (AVR),TEE;  Surgeon: Gaye Pollack, MD;  Location: [place];  Service: Open Heart Surgery;  Laterality: N/A;  . HAND SURGERY     left hang following trauma  . RIGHT/LEFT HEART  CATH AND CORONARY ANGIOGRAPHY N/A 04/12/2017   Procedure: RIGHT/LEFT HEART CATH AND CORONARY ANGIOGRAPHY;  Surgeon: Burnell Blanks, MD;  Location: [place] [facility];  Service: Cardiovascular;  Laterality: N/A;  . TEE WITHOUT CARDIOVERSION N/A 05/18/2017   Procedure: TRANSESOPHAGEAL ECHOCARDIOGRAM (TEE);  Surgeon: Gaye Pollack, MD;  Location: [place];  Service: Open Heart Surgery;  Laterality: N/A;    Family History: Family History  Problem Relation Age of Onset  . Lung cancer Other        brother and father  . Heart murmur Other        mother  . Cancer Mother   . Heart failure Mother   . Cancer Father   . Cancer Brother   . Cirrhosis Brother   . Alcohol abuse Brother     Social History: Social History   Tobacco Use  Smoking Status Former Smoker  . Packs/day: 1.00  . Years: 10.00  . Pack years: 10.00  . Types: Cigarettes  Smokeless Tobacco Former Systems developer   Social History   Substance and Sexual Activity  Alcohol Use No   Social History   Substance and Sexual Activity  Drug Use No    Allergies: No Known Allergies  Medications: Current Outpatient Medications  Medication Sig Dispense Refill  . aspirin EC 81 MG tablet Take 81 mg by mouth daily.    . cetirizine (ZYRTEC) 10 MG tablet Take 1 tablet by mouth as needed for allergies.    Marland Kitchen lisinopril (ZESTRIL) 2.5 MG tablet Take 1 tablet (2.5 mg total) by mouth daily. 90 tablet 3  . metoprolol succinate (TOPROL-XL) 25 MG 24 hr tablet Take 1 tablet (25 mg total)  by mouth daily. 90 tablet 1  . Multiple Vitamin (MULTIVITAMIN) tablet Take 1 tablet by mouth daily.    . pantoprazole (PROTONIX) 40 MG tablet Take 40 mg by mouth daily.    . rosuvastatin (CRESTOR) 5 MG tablet Take 5 mg by mouth daily.     No current facility-administered medications for this visit.    Review of Systems: GENERAL: negative for malaise, night sweats HEENT: No changes in hearing or vision, no nose bleeds or other nasal problems. NECK:  Negative for lumps, goiter, pain and significant neck swelling RESPIRATORY: Negative for cough, wheezing CARDIOVASCULAR: Negative for chest pain, leg swelling, palpitations, orthopnea GI: SEE HPI MUSCULOSKELETAL: Negative for joint pain or swelling, back pain, and muscle pain. SKIN: Negative for lesions, rash PSYCH: Negative for sleep disturbance, mood disorder and recent psychosocial stressors. HEMATOLOGY Negative for prolonged bleeding, bruising easily, and swollen nodes. ENDOCRINE: Negative for cold or heat intolerance, polyuria, polydipsia and goiter. NEURO: negative for tremor, gait imbalance, syncope and seizures. The remainder of the review of systems is noncontributory.   Physical Exam: BP (!) 156/86 (BP Location: Right Arm, Patient Position: Sitting, Cuff Size: Large)   Pulse 66   Temp 98.1 F (36.7 C) (Oral)   Ht 5\' 9"  (1.753 m)   Wt 164 lb (74.4 kg)   BMI 24.22 kg/m  GENERAL: The patient is AO x3, in no acute distress. HEENT: Head is normocephalic and atraumatic. EOMI are intact. Mouth is well hydrated and without lesions. NECK: Supple. No masses LUNGS: Clear to auscultation. No presence of rhonchi/wheezing/rales. Adequate chest expansion HEART: RRR, normal s1 and s2. ABDOMEN: Soft, nontender, no guarding, no peritoneal signs, and nondistended. BS +. No masses. EXTREMITIES: Without any cyanosis, clubbing, rash, lesions or edema. NEUROLOGIC: AOx3, no focal motor deficit. SKIN: no jaundice, no rashes   Imaging/Labs: as above  I personally reviewed and interpreted the available labs, imaging and endoscopic files.  Impression and Plan: Jeremy Larson is a 70 y.o. male with past medical history of aortic stenosis status post aortic valve replacement in 0177, chronic systolic heart failure (most recent ejection fraction 60-65%) who presents for evaluation of positive Cologuard.  Patient has never had a colonoscopy in the past.  Denies any family history for colorectal  cancer or any high risk features for increased risk of malignancy. Discussed cologuard test results in detail, specifically what it means when the test is positive or negative.  Discussed that there is a possibility that even when the test is positive there may not be a polyp found on colonoscopy.  We will proceed with colonoscopy.  I have the patient have transient episodes dysphagia that improved with PPI, this is likely related to GERD, for which I counseled patient to continue taking his Protonix as he has is taking it.  - Schedule colonoscopy - Continue Protonix 40 mg qday  All questions were answered.      Jeremy Peppers, MD Gastroenterology and Hepatology [facility]

## 2020-09-02 NOTE — Progress Notes (Signed)
[name], M.D. Gastroenterology & Hepatology [facility] [address] [place], [place] [address] Primary Care Physician: Caryl Bis, MD [place] [id number]  Referring MD: PCP  Chief Complaint: Positive Cologuard  History of Present Illness: Jeremy Larson is a 70 y.o. male with past medical history of aortic stenosis status post aortic valve replacement in 7564, chronic systolic heart failure (most recent ejection fraction 60-65%) who presents for evaluation of positive Cologuard.  Patient had a positive Cologuard testing on 06/23/2020 and was referred to gastroenterology. Denies any complaints. The patient denies having any nausea, vomiting, fever, chills, hematochezia, melena, hematemesis, abdominal distention, abdominal pain, diarrhea, jaundice, pruritus or weight loss.  3-4 months ago he had some dysphagia to solids. Was advised by his PCP to take Protonix daily and reports since he started the medications his dysphagia has resolved.  He has not had any heartburn or odynophagia.  Has been taking the medication compliantly.  Last PPI:RJJOA Last Colonoscopy:never  FHx: neg for any gastrointestinal/liver disease, melanoma son, lung cancer father Social: neg smoking, alcohol or illicit drug use Surgical: no abdominal surgeries  Past Medical History: Past Medical History:  Diagnosis Date  . Aortic stenosis    a. s/p AVR on 05/18/2017 with 23 mm Edwards Magna-Ease Pericardial valve  . Chronic systolic (congestive) heart failure ([place])    a. 05/2017: TEE showing EF 40-45%  . Dyspnea     Past Surgical History: Past Surgical History:  Procedure Laterality Date  . AORTIC VALVE REPLACEMENT N/A 05/18/2017   Procedure: AORTIC VALVE REPLACEMENT (AVR),TEE;  Surgeon: Gaye Pollack, MD;  Location: [place];  Service: Open Heart Surgery;  Laterality: N/A;  . HAND SURGERY     left hang following trauma  . RIGHT/LEFT HEART  CATH AND CORONARY ANGIOGRAPHY N/A 04/12/2017   Procedure: RIGHT/LEFT HEART CATH AND CORONARY ANGIOGRAPHY;  Surgeon: Burnell Blanks, MD;  Location: [place] [facility];  Service: Cardiovascular;  Laterality: N/A;  . TEE WITHOUT CARDIOVERSION N/A 05/18/2017   Procedure: TRANSESOPHAGEAL ECHOCARDIOGRAM (TEE);  Surgeon: Gaye Pollack, MD;  Location: [place];  Service: Open Heart Surgery;  Laterality: N/A;    Family History: Family History  Problem Relation Age of Onset  . Lung cancer Other        brother and father  . Heart murmur Other        mother  . Cancer Mother   . Heart failure Mother   . Cancer Father   . Cancer Brother   . Cirrhosis Brother   . Alcohol abuse Brother     Social History: Social History   Tobacco Use  Smoking Status Former Smoker  . Packs/day: 1.00  . Years: 10.00  . Pack years: 10.00  . Types: Cigarettes  Smokeless Tobacco Former Systems developer   Social History   Substance and Sexual Activity  Alcohol Use No   Social History   Substance and Sexual Activity  Drug Use No    Allergies: No Known Allergies  Medications: Current Outpatient Medications  Medication Sig Dispense Refill  . aspirin EC 81 MG tablet Take 81 mg by mouth daily.    . cetirizine (ZYRTEC) 10 MG tablet Take 1 tablet by mouth as needed for allergies.    Marland Kitchen lisinopril (ZESTRIL) 2.5 MG tablet Take 1 tablet (2.5 mg total) by mouth daily. 90 tablet 3  . metoprolol succinate (TOPROL-XL) 25 MG 24 hr tablet Take 1 tablet (25 mg total)  by mouth daily. 90 tablet 1  . Multiple Vitamin (MULTIVITAMIN) tablet Take 1 tablet by mouth daily.    . pantoprazole (PROTONIX) 40 MG tablet Take 40 mg by mouth daily.    . rosuvastatin (CRESTOR) 5 MG tablet Take 5 mg by mouth daily.     No current facility-administered medications for this visit.    Review of Systems: GENERAL: negative for malaise, night sweats HEENT: No changes in hearing or vision, no nose bleeds or other nasal problems. NECK:  Negative for lumps, goiter, pain and significant neck swelling RESPIRATORY: Negative for cough, wheezing CARDIOVASCULAR: Negative for chest pain, leg swelling, palpitations, orthopnea GI: SEE HPI MUSCULOSKELETAL: Negative for joint pain or swelling, back pain, and muscle pain. SKIN: Negative for lesions, rash PSYCH: Negative for sleep disturbance, mood disorder and recent psychosocial stressors. HEMATOLOGY Negative for prolonged bleeding, bruising easily, and swollen nodes. ENDOCRINE: Negative for cold or heat intolerance, polyuria, polydipsia and goiter. NEURO: negative for tremor, gait imbalance, syncope and seizures. The remainder of the review of systems is noncontributory.   Physical Exam: BP (!) 156/86 (BP Location: Right Arm, Patient Position: Sitting, Cuff Size: Large)   Pulse 66   Temp 98.1 F (36.7 C) (Oral)   Ht 5\' 9"  (1.753 m)   Wt 164 lb (74.4 kg)   BMI 24.22 kg/m  GENERAL: The patient is AO x3, in no acute distress. HEENT: Head is normocephalic and atraumatic. EOMI are intact. Mouth is well hydrated and without lesions. NECK: Supple. No masses LUNGS: Clear to auscultation. No presence of rhonchi/wheezing/rales. Adequate chest expansion HEART: RRR, normal s1 and s2. ABDOMEN: Soft, nontender, no guarding, no peritoneal signs, and nondistended. BS +. No masses. EXTREMITIES: Without any cyanosis, clubbing, rash, lesions or edema. NEUROLOGIC: AOx3, no focal motor deficit. SKIN: no jaundice, no rashes   Imaging/Labs: as above  I personally reviewed and interpreted the available labs, imaging and endoscopic files.  Impression and Plan: Jeremy Larson is a 70 y.o. male with past medical history of aortic stenosis status post aortic valve replacement in 3570, chronic systolic heart failure (most recent ejection fraction 60-65%) who presents for evaluation of positive Cologuard.  Patient has never had a colonoscopy in the past.  Denies any family history for colorectal  cancer or any high risk features for increased risk of malignancy. Discussed cologuard test results in detail, specifically what it means when the test is positive or negative.  Discussed that there is a possibility that even when the test is positive there may not be a polyp found on colonoscopy.  We will proceed with colonoscopy.  I have the patient have transient episodes dysphagia that improved with PPI, this is likely related to GERD, for which I counseled patient to continue taking his Protonix as he has is taking it.  - Schedule colonoscopy - Continue Protonix 40 mg qday  All questions were answered.      [name], MD Gastroenterology and Hepatology [facility]

## 2020-09-02 NOTE — Patient Instructions (Addendum)
Schedule colonoscopy Continue Protonix 40 mg qday

## 2020-09-03 ENCOUNTER — Encounter (INDEPENDENT_AMBULATORY_CARE_PROVIDER_SITE_OTHER): Payer: Self-pay

## 2020-09-03 ENCOUNTER — Telehealth (INDEPENDENT_AMBULATORY_CARE_PROVIDER_SITE_OTHER): Payer: Self-pay

## 2020-09-03 ENCOUNTER — Other Ambulatory Visit (INDEPENDENT_AMBULATORY_CARE_PROVIDER_SITE_OTHER): Payer: Self-pay

## 2020-09-03 MED ORDER — PEG 3350-KCL-NA BICARB-NACL 420 G PO SOLR
4000.0000 mL | ORAL | 0 refills | Status: DC
Start: 2020-09-03 — End: 2020-09-08

## 2020-09-03 NOTE — Telephone Encounter (Signed)
LeighAnn Sesar [place], CMA  

## 2020-09-07 ENCOUNTER — Other Ambulatory Visit: Payer: Self-pay

## 2020-09-07 ENCOUNTER — Other Ambulatory Visit (HOSPITAL_COMMUNITY)
Admission: RE | Admit: 2020-09-07 | Discharge: 2020-09-07 | Disposition: A | Discharge status: Home / Self Care | Payer: Medicare Other | Source: Ambulatory Visit | Attending: Gastroenterology | Admitting: Gastroenterology

## 2020-09-07 DIAGNOSIS — Z20822 Contact with and (suspected) exposure to covid-19: Secondary | ICD-10-CM | POA: Insufficient documentation

## 2020-09-07 DIAGNOSIS — Z01812 Encounter for preprocedural laboratory examination: Secondary | ICD-10-CM | POA: Insufficient documentation

## 2020-09-07 LAB — SARS CORONAVIRUS 2 (TAT 6-24 HRS): SARS Coronavirus 2: NEGATIVE

## 2020-09-08 ENCOUNTER — Encounter (HOSPITAL_COMMUNITY)
Admission: RE | Disposition: A | Discharge status: Home / Self Care | Payer: Self-pay | Source: Home / Self Care | Attending: Gastroenterology

## 2020-09-08 ENCOUNTER — Encounter (HOSPITAL_COMMUNITY): Payer: Self-pay | Admitting: Gastroenterology

## 2020-09-08 ENCOUNTER — Ambulatory Visit (HOSPITAL_COMMUNITY): Payer: Medicare Other | Admitting: Anesthesiology

## 2020-09-08 ENCOUNTER — Other Ambulatory Visit: Payer: Self-pay

## 2020-09-08 ENCOUNTER — Ambulatory Visit (HOSPITAL_COMMUNITY)
Admission: RE | Admit: 2020-09-08 | Discharge: 2020-09-08 | Disposition: A | Discharge status: Home / Self Care | Payer: Medicare Other | Attending: Gastroenterology | Admitting: Gastroenterology

## 2020-09-08 DIAGNOSIS — Z8249 Family history of ischemic heart disease and other diseases of the circulatory system: Secondary | ICD-10-CM | POA: Insufficient documentation

## 2020-09-08 DIAGNOSIS — K5730 Diverticulosis of large intestine without perforation or abscess without bleeding: Secondary | ICD-10-CM | POA: Diagnosis not present

## 2020-09-08 DIAGNOSIS — Z801 Family history of malignant neoplasm of trachea, bronchus and lung: Secondary | ICD-10-CM | POA: Diagnosis not present

## 2020-09-08 DIAGNOSIS — Z87891 Personal history of nicotine dependence: Secondary | ICD-10-CM | POA: Diagnosis not present

## 2020-09-08 DIAGNOSIS — Z7982 Long term (current) use of aspirin: Secondary | ICD-10-CM | POA: Insufficient documentation

## 2020-09-08 DIAGNOSIS — I350 Nonrheumatic aortic (valve) stenosis: Secondary | ICD-10-CM | POA: Insufficient documentation

## 2020-09-08 DIAGNOSIS — I509 Heart failure, unspecified: Secondary | ICD-10-CM | POA: Diagnosis not present

## 2020-09-08 DIAGNOSIS — Z809 Family history of malignant neoplasm, unspecified: Secondary | ICD-10-CM | POA: Insufficient documentation

## 2020-09-08 DIAGNOSIS — R195 Other fecal abnormalities: Secondary | ICD-10-CM | POA: Diagnosis not present

## 2020-09-08 DIAGNOSIS — Z79899 Other long term (current) drug therapy: Secondary | ICD-10-CM | POA: Insufficient documentation

## 2020-09-08 DIAGNOSIS — Z952 Presence of prosthetic heart valve: Secondary | ICD-10-CM | POA: Insufficient documentation

## 2020-09-08 DIAGNOSIS — Z8379 Family history of other diseases of the digestive system: Secondary | ICD-10-CM | POA: Diagnosis not present

## 2020-09-08 DIAGNOSIS — I5022 Chronic systolic (congestive) heart failure: Secondary | ICD-10-CM | POA: Diagnosis not present

## 2020-09-08 HISTORY — PX: COLONOSCOPY WITH PROPOFOL: SHX5780

## 2020-09-08 LAB — HM COLONOSCOPY

## 2020-09-08 SURGERY — COLONOSCOPY WITH PROPOFOL
Anesthesia: General

## 2020-09-08 MED ORDER — PROPOFOL 500 MG/50ML IV EMUL
INTRAVENOUS | Status: DC | PRN
  Administered 2020-09-08: 150 ug/kg/min via INTRAVENOUS

## 2020-09-08 MED ORDER — STERILE WATER FOR IRRIGATION IR SOLN
Status: DC | PRN
  Administered 2020-09-08: 100 mL

## 2020-09-08 MED ORDER — LACTATED RINGERS IV SOLN: INTRAVENOUS | Status: DC

## 2020-09-08 MED ORDER — PROPOFOL 10 MG/ML IV BOLUS
INTRAVENOUS | Status: DC | PRN
  Administered 2020-09-08: 40 mg via INTRAVENOUS
  Administered 2020-09-08: 100 mg via INTRAVENOUS

## 2020-09-08 NOTE — Transfer of Care (Signed)
Immediate Anesthesia Transfer of Care Note  Patient: Jeremy Larson  Procedure(s) Performed: COLONOSCOPY WITH PROPOFOL (N/A )  Patient Location: PACU  Anesthesia Type:General  Level of Consciousness: awake  Airway & Oxygen Therapy: Patient Spontanous Breathing  Post-op Assessment: Report given to RN and Post -op Vital signs reviewed and stable  Post vital signs: Reviewed and stable  Last Vitals:  Vitals Value Taken Time  BP    Temp    Pulse    Resp    SpO2      Last Pain:  Vitals:   09/08/20 1214  TempSrc:   PainSc: 0-No pain      Patients Stated Pain Goal: 7 (75/44/92 0100)  Complications: No complications documented.

## 2020-09-08 NOTE — Discharge Instructions (Signed)
Diverticulosis  Diverticulosis is a condition that develops when small pouches (diverticula) form in the wall of the large intestine (colon). The colon is where water is absorbed and stool (feces) is formed. The pouches form when the inside layer of the colon pushes through weak spots in the outer layers of the colon. You may have a few pouches or many of them. The pouches usually do not cause problems unless they become inflamed or infected. When this happens, the condition is called diverticulitis. What are the causes? The cause of this condition is not known. What increases the risk? The following factors may make you more likely to develop this condition:  Being older than age 60. Your risk for this condition increases with age. Diverticulosis is rare among people younger than age 30. By age 80, many people have it.  Eating a low-fiber diet.  Having frequent constipation.  Being overweight.  Not getting enough exercise.  Smoking.  Taking over-the-counter pain medicines, like aspirin and ibuprofen.  Having a family history of diverticulosis. What are the signs or symptoms? In most people, there are no symptoms of this condition. If you do have symptoms, they may include:  Bloating.  Cramps in the abdomen.  Constipation or diarrhea.  Pain in the lower left side of the abdomen. How is this diagnosed? Because diverticulosis usually has no symptoms, it is most often diagnosed during an exam for other colon problems. The condition may be diagnosed by:  Using a flexible scope to examine the colon (colonoscopy).  Taking an X-ray of the colon after dye has been put into the colon (barium enema).  Having a CT scan. How is this treated? You may not need treatment for this condition. Your health care provider may recommend treatment to prevent problems. You may need treatment if you have symptoms or if you previously had diverticulitis. Treatment may include:  Eating a high-fiber  diet.  Taking a fiber supplement.  Taking a live bacteria supplement (probiotic).  Taking medicine to relax your colon.   Follow these instructions at home: Medicines  Take over-the-counter and prescription medicines only as told by your health care provider.  If told by your health care provider, take a fiber supplement or probiotic. Constipation prevention Your condition may cause constipation. To prevent or treat constipation, you may need to:  Drink enough fluid to keep your urine pale yellow.  Take over-the-counter or prescription medicines.  Eat foods that are high in fiber, such as beans, whole grains, and fresh fruits and vegetables.  Limit foods that are high in fat and processed sugars, such as fried or sweet foods.   General instructions  Try not to strain when you have a bowel movement.  Keep all follow-up visits as told by your health care provider. This is important. Contact a health care provider if you:  Have pain in your abdomen.  Have bloating.  Have cramps.  Have not had a bowel movement in 3 days. Get help right away if:  Your pain gets worse.  Your bloating becomes very bad.  You have a fever or chills, and your symptoms suddenly get worse.  You vomit.  You have bowel movements that are bloody or black.  You have bleeding from your rectum. Summary  Diverticulosis is a condition that develops when small pouches (diverticula) form in the wall of the large intestine (colon).  You may have a few pouches or many of them.  This condition is most often diagnosed during an exam   for other colon problems.  Treatment may include increasing the fiber in your diet, taking supplements, or taking medicines. This information is not intended to replace advice given to you by your health care provider. Make sure you discuss any questions you have with your health care provider. Document Revised: 11/29/2018 Document Reviewed: 11/29/2018 Elsevier Patient  Education  2021 [place]. Colonoscopy, Adult, Care After This sheet gives you information about how to care for yourself after your procedure. Your health care provider may also give you more specific instructions. If you have problems or questions, contact your health care provider. What can I expect after the procedure? After the procedure, it is common to have:  A small amount of blood in your stool for 24 hours after the procedure.  Some gas.  Mild cramping or bloating of your abdomen. Follow these instructions at home: Eating and drinking  Drink enough fluid to keep your urine pale yellow.  Follow instructions from your health care provider about eating or drinking restrictions.  Resume your normal diet as instructed by your health care provider. Avoid heavy or fried foods that are hard to digest.   Activity  Rest as told by your health care provider.  Avoid sitting for a long time without moving. Get up to take short walks every 1-2 hours. This is important to improve blood flow and breathing. Ask for help if you feel weak or unsteady.  Return to your normal activities as told by your health care provider. Ask your health care provider what activities are safe for you. Managing cramping and bloating  Try walking around when you have cramps or feel bloated.  Apply heat to your abdomen as told by your health care provider. Use the heat source that your health care provider recommends, such as a moist heat pack or a heating pad. ? Place a towel between your skin and the heat source. ? Leave the heat on for 20-30 minutes. ? Remove the heat if your skin turns bright red. This is especially important if you are unable to feel pain, heat, or cold. You may have a greater risk of getting burned.   General instructions  If you were given a sedative during the procedure, it can affect you for several hours. Do not drive or operate machinery until your health care provider says  that it is safe.  For the first 24 hours after the procedure: ? Do not sign important documents. ? Do not drink alcohol. ? Do your regular daily activities at a slower pace than normal. ? Eat soft foods that are easy to digest.  Take over-the-counter and prescription medicines only as told by your health care provider.  Keep all follow-up visits as told by your health care provider. This is important. Contact a health care provider if:  You have blood in your stool 2-3 days after the procedure. Get help right away if you have:  More than a small spotting of blood in your stool.  Large blood clots in your stool.  Swelling of your abdomen.  Nausea or vomiting.  A fever.  Increasing pain in your abdomen that is not relieved with medicine. Summary  After the procedure, it is common to have a small amount of blood in your stool. You may also have mild cramping and bloating of your abdomen.  If you were given a sedative during the procedure, it can affect you for several hours. Do not drive or operate machinery until your health care provider  says that it is safe.  Get help right away if you have a lot of blood in your stool, nausea or vomiting, a fever, or increased pain in your abdomen. This information is not intended to replace advice given to you by your health care provider. Make sure you discuss any questions you have with your health care provider. Document Revised: 04/26/2019 Document Reviewed: 11/26/2018 Elsevier Patient Education  2021 Elsevier Inc. You are being discharged to home.  Resume your previous diet.  Your physician has recommended a repeat colonoscopy in 10 years for screening purposes.  

## 2020-09-08 NOTE — Interval H&P Note (Signed)
History and Physical Interval Note:  09/08/2020 11:44 AM Jeremy Larson is a 70 y.o. male with past medical history of aortic stenosis status post aortic valve replacement in 1308, chronic systolic heart failure (most recent ejection fraction 60-65%) who presents for evaluation of positive Cologuard.  Patient denies having any symptoms such as  nausea, vomiting, fever, chills, hematochezia, melena, hematemesis, abdominal distention, abdominal pain, diarrhea, jaundice, pruritus or weight loss.  BP (!) 158/94   Temp 97.9 F (36.6 C) (Oral)   Resp 14   Ht 5\' 9"  (1.753 m)   Wt 76.7 kg   SpO2 98%   BMI 24.96 kg/m  GENERAL: The patient is AO x3, in no acute distress. HEENT: Head is normocephalic and atraumatic. EOMI are intact. Mouth is well hydrated and without lesions. NECK: Supple. No masses LUNGS: Clear to auscultation. No presence of rhonchi/wheezing/rales. Adequate chest expansion HEART: RRR, normal s1 and s2. ABDOMEN: Soft, nontender, no guarding, no peritoneal signs, and nondistended. BS +. No masses. EXTREMITIES: Without any cyanosis, clubbing, rash, lesions or edema. NEUROLOGIC: AOx3, no focal motor deficit. SKIN: no jaundice, no rashes    Jeremy Larson  has presented today for surgery, with the diagnosis of Positive Cologuard.  The various methods of treatment have been discussed with the patient and family. After consideration of risks, benefits and other options for treatment, the patient has consented to  Procedure(s) with comments: COLONOSCOPY WITH PROPOFOL (N/A) - 11:15 AM as a surgical intervention.  The patient's history has been reviewed, patient examined, no change in status, stable for surgery.  I have reviewed the patient's chart and labs.  Questions were answered to the patient's satisfaction.     [name]

## 2020-09-08 NOTE — Anesthesia Postprocedure Evaluation (Signed)
Anesthesia Post Note  Patient: Jeremy Larson  Procedure(s) Performed: COLONOSCOPY WITH PROPOFOL (N/A )  Patient location during evaluation: Endoscopy Anesthesia Type: General Level of consciousness: awake and alert and oriented Pain management: pain level controlled Vital Signs Assessment: post-procedure vital signs reviewed and stable Respiratory status: spontaneous breathing and respiratory function stable Cardiovascular status: blood pressure returned to baseline and stable Postop Assessment: no apparent nausea or vomiting Anesthetic complications: no   No complications documented.   Last Vitals:  Vitals:   09/08/20 1242 09/08/20 1245  BP:  121/63  Pulse: 94   Resp: 16   Temp: 36.6 C   SpO2: 95%     Last Pain:  Vitals:   09/08/20 1242  TempSrc: Oral  PainSc:                  Aurorah Schlachter C Callan Norden

## 2020-09-08 NOTE — Anesthesia Preprocedure Evaluation (Addendum)
Anesthesia Evaluation  Patient identified by MRN, date of birth, ID band Patient awake    Reviewed: Allergy & Precautions, NPO status , Patient's Chart, lab work & pertinent test results, reviewed documented beta blocker date and time   Airway Mallampati: III  TM Distance: >3 FB Neck ROM: Full    Dental  (+) Dental Advisory Given, Upper Dentures, Lower Dentures   Pulmonary shortness of breath, former smoker,    Pulmonary exam normal breath sounds clear to auscultation       Cardiovascular Exercise Tolerance: Good hypertension, Pt. on medications and Pt. on home beta blockers + CABG and +CHF  Normal cardiovascular exam+ Valvular Problems/Murmurs (AVR)  Rhythm:Regular Rate:Normal     Neuro/Psych negative neurological ROS  negative psych ROS   GI/Hepatic Neg liver ROS, GERD  Medicated and Controlled,  Endo/Other  negative endocrine ROS  Renal/GU negative Renal ROS     Musculoskeletal   Abdominal   Peds  Hematology negative hematology ROS (+)   Anesthesia Other Findings   Reproductive/Obstetrics                           Anesthesia Physical Anesthesia Plan  ASA: III  Anesthesia Plan: General   Post-op Pain Management:    Induction: Intravenous  PONV Risk Score and Plan: Propofol infusion  Airway Management Planned: Nasal Cannula and Natural Airway  Additional Equipment:   Intra-op Plan:   Post-operative Plan:   Informed Consent: I have reviewed the patients History and Physical, chart, labs and discussed the procedure including the risks, benefits and alternatives for the proposed anesthesia with the patient or authorized representative who has indicated his/her understanding and acceptance.     Dental advisory given  Plan Discussed with: CRNA and Surgeon  Anesthesia Plan Comments:        Anesthesia Quick Evaluation

## 2020-09-08 NOTE — Op Note (Signed)
[facility] Patient Name: Jeremy Larson Procedure Date: 09/08/2020 11:27 AM MRN: [id number] Date of Birth: 1950-08-31 Attending MD: [name] ,  CSN: [id number] Age: 70 Admit Type: Outpatient Procedure:                Colonoscopy Indications:              Positive Cologuard test Providers:                [name], Rosina Lowenstein, RN, Nelma Rothman,                            Technician Referring MD:              Medicines:                Monitored Anesthesia Care Complications:            No immediate complications. Estimated Blood Loss:     Estimated blood loss: none. Procedure:                Pre-Anesthesia Assessment:                           - Prior to the procedure, a History and Physical                            was performed, and patient medications, allergies                            and sensitivities were reviewed. The patient's                            tolerance of previous anesthesia was reviewed.                           - The risks and benefits of the procedure and the                            sedation options and risks were discussed with the                            patient. All questions were answered and informed                            consent was obtained.                           After obtaining informed consent, the colonoscope                            was passed under direct vision. Throughout the                            procedure, the patient's blood pressure, pulse, and                            oxygen saturations were monitored continuously. The  [id number]) was introduced through the anus                            and advanced to the the cecum, identified by                            appendiceal orifice and ileocecal valve. The                            colonoscopy was performed without difficulty. The                            patient tolerated the procedure well. The quality                             of the bowel preparation was excellent. Scope In: 12:19:55 PM Scope Out: 41:32:44 PM Scope Withdrawal Time: 0 hours 13 minutes 53 seconds  Total Procedure Duration: 0 hours 19 minutes 23 seconds  Findings:      The perianal and digital rectal examinations were normal.      The terminal ileum appeared normal.      A few small and large-mouthed diverticula were found in the sigmoid       colon.      The retroflexed view of the distal rectum and anal verge was normal and       showed no anal or rectal abnormalities. Impression:               - The examined portion of the ileum was normal.                           - Diverticulosis in the sigmoid colon.                           - The distal rectum and anal verge are normal on                            retroflexion view.                           - No specimens collected. Moderate Sedation:      Per Anesthesia Care Recommendation:           - Discharge patient to home (ambulatory).                           - Resume previous diet.                           - Repeat colonoscopy in 10 years for screening                            purposes. Procedure Code(s):        --- Professional ---                           [phone], Colonoscopy, flexible; diagnostic, including  collection of specimen(s) by brushing or washing,                            when performed (separate procedure) Diagnosis Code(s):        --- Professional ---                           R19.5, Other fecal abnormalities                           K57.30, Diverticulosis of large intestine without                            perforation or abscess without bleeding CPT copyright 2019 American Medical Association. All rights reserved. The codes documented in this report are preliminary and upon coder review may  be revised to meet current compliance requirements. [name], MD [name],  09/08/2020 12:42:54 PM This report  has been signed electronically. Number of Addenda: 0

## 2020-09-09 ENCOUNTER — Encounter (INDEPENDENT_AMBULATORY_CARE_PROVIDER_SITE_OTHER): Payer: Self-pay | Admitting: *Deleted

## 2020-09-10 ENCOUNTER — Encounter (HOSPITAL_COMMUNITY): Payer: Self-pay | Admitting: Gastroenterology

## 2020-10-30 ENCOUNTER — Other Ambulatory Visit: Payer: Self-pay | Admitting: Cardiology

## 2020-12-01 ENCOUNTER — Other Ambulatory Visit: Payer: Self-pay

## 2020-12-01 ENCOUNTER — Ambulatory Visit (INDEPENDENT_AMBULATORY_CARE_PROVIDER_SITE_OTHER): Payer: Medicare Other | Admitting: Cardiology

## 2020-12-01 ENCOUNTER — Encounter: Payer: Self-pay | Admitting: Cardiology

## 2020-12-01 ENCOUNTER — Encounter: Payer: Self-pay | Admitting: *Deleted

## 2020-12-01 VITALS — BP 151/78 | HR 76 | Ht 69.0 in | Wt 162.2 lb

## 2020-12-01 DIAGNOSIS — Z952 Presence of prosthetic heart valve: Secondary | ICD-10-CM | POA: Diagnosis not present

## 2020-12-01 DIAGNOSIS — E782 Mixed hyperlipidemia: Secondary | ICD-10-CM

## 2020-12-01 DIAGNOSIS — I6523 Occlusion and stenosis of bilateral carotid arteries: Secondary | ICD-10-CM

## 2020-12-01 DIAGNOSIS — I10 Essential (primary) hypertension: Secondary | ICD-10-CM | POA: Diagnosis not present

## 2020-12-01 MED ORDER — LISINOPRIL 5 MG PO TABS: 5.0000 mg | ORAL_TABLET | Freq: Every day | ORAL | 3 refills | Status: DC

## 2020-12-01 NOTE — Patient Instructions (Signed)
Medication Instructions:  Increase Lisinopril to 5mg  daily.  Continue all other medications.    Labwork: none  Testing/Procedures: Your physician has requested that you have a carotid duplex. This test is an ultrasound of the carotid arteries in your neck. It looks at blood flow through these arteries that supply the brain with blood. Allow one hour for this exam. There are no restrictions or special instructions. Office will contact with results via phone or letter.    Follow-Up: Your physician wants you to follow up in:  1 year.  You will receive a reminder letter in the mail one-two months in advance.  If you don't receive a letter, please call our office to schedule the follow up appointment.  Any Other Special Instructions Will Be Listed Below (If Applicable).  If you need a refill on your cardiac medications before your next appointment, please call your pharmacy.

## 2020-12-01 NOTE — Progress Notes (Signed)
Clinical Summary Jeremy Larson is a 70 y.o.male seen today for follow up of the following medical problems.    1. Aortic stenosis/Bicuspid aortic valve - probable bicuspid AV by prior echo. - echo 01/2010 LVEF 60-65%, bicuspid AV mod AS mean grad 34 and AVA VTI 1.12 03/2017 echo: LVEF 15-40%, grade I diastolic dysfunction, AV mean grad 51, AVA VTI 0.39. 03/2017 cath: mild nonsobstructive CAD. Normal filling pressures by [facility] 03/2017 CTA Chest/Abd/Pelvis: no aortic pathology   - s/p surgical AVR with 23 mm [facility] Ease pericardial valve Jan 2019 - f/u echo 07/2017 shows LVEF 08-67%, grade I diastolic dysfunction, normal functioning AVR.     - no SOB or DOE. No LE edema    2. HTN - he is compliant with medications  3. Hyperlipidemia - labs followed by pcp - he is on crestor   4. 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 Going to get a PhD.      Past Medical History:  Diagnosis Date   Aortic stenosis    a. s/p AVR on 05/18/2017 with 23 mm Edwards Magna-Ease Pericardial valve   Chronic systolic (congestive) heart failure ([place])    a. 05/2017: TEE showing EF 40-45%   Dyspnea      No Known Allergies   Current Outpatient Medications  Medication Sig Dispense Refill   aspirin EC 81 MG tablet Take 81 mg by mouth daily.     cetirizine (ZYRTEC) 10 MG tablet Take 1 tablet by mouth as needed for allergies.     lisinopril (ZESTRIL) 2.5 MG tablet Take 1 tablet (2.5 mg total) by mouth daily. 90 tablet 3   metoprolol succinate (TOPROL-XL) 25 MG 24 hr tablet TAKE ONE TABLET BY MOUTH DAILY 90 tablet 1   Multiple Vitamin (MULTIVITAMIN) tablet Take 1 tablet by mouth daily.     pantoprazole (PROTONIX) 40 MG tablet Take 40 mg by mouth daily.     rosuvastatin (CRESTOR) 5 MG tablet Take 5  mg by mouth daily.     No current facility-administered medications for this visit.     Past Surgical History:  Procedure Laterality Date   AORTIC VALVE REPLACEMENT N/A 05/18/2017   Procedure: AORTIC VALVE REPLACEMENT (AVR),TEE;  Surgeon: Gaye Pollack, MD;  Location: [place];  Service: Open Heart Surgery;  Laterality: N/A;   COLONOSCOPY WITH PROPOFOL N/A 09/08/2020   Procedure: COLONOSCOPY WITH PROPOFOL;  Surgeon: Harvel Quale, MD;  [facility];  Service: Gastroenterology;  Laterality: N/A;  11:15 AM   HAND SURGERY     left hang following trauma   RIGHT/LEFT HEART CATH AND CORONARY ANGIOGRAPHY N/A 04/12/2017   Procedure: RIGHT/LEFT HEART CATH AND CORONARY ANGIOGRAPHY;  Surgeon: Burnell Blanks, MD;  Location: [place] [facility];  Service: Cardiovascular;  Laterality: N/A;   TEE WITHOUT CARDIOVERSION N/A 05/18/2017   Procedure: TRANSESOPHAGEAL ECHOCARDIOGRAM (TEE);  Surgeon: Gaye Pollack, MD;  Location: [place];  Service: Open Heart Surgery;  Laterality: N/A;     No Known Allergies    Family History  Problem Relation Age of Onset   Lung cancer Other        brother and father   Heart murmur Other  mother   Cancer Mother    Heart failure Mother    Cancer Father    Cancer Brother    Cirrhosis Brother    Alcohol abuse Brother      Social History Mr. Schnabel reports that he has quit smoking. His smoking use included cigarettes. He has a 10.00 pack-year smoking history. He has quit using smokeless tobacco. Mr. Haisley reports no history of alcohol use.   Review of Systems CONSTITUTIONAL: No weight loss, fever, chills, weakness or fatigue.  HEENT: Eyes: No visual loss, blurred vision, double vision or yellow sclerae.No hearing loss, sneezing, congestion, runny nose or sore throat.  SKIN: No rash or itching.  CARDIOVASCULAR: per hpi RESPIRATORY: No shortness of breath, cough or sputum.  GASTROINTESTINAL: No anorexia, nausea, vomiting or  diarrhea. No abdominal pain or blood.  GENITOURINARY: No burning on urination, no polyuria NEUROLOGICAL: No headache, dizziness, syncope, paralysis, ataxia, numbness or tingling in the extremities. No change in bowel or bladder control.  MUSCULOSKELETAL: No muscle, back pain, joint pain or stiffness.  LYMPHATICS: No enlarged nodes. No history of splenectomy.  PSYCHIATRIC: No history of depression or anxiety.  ENDOCRINOLOGIC: No reports of sweating, cold or heat intolerance. No polyuria or polydipsia.  Marland Kitchen   Physical Examination Today's Vitals   12/01/20 1143  BP: (!) 151/78  Pulse: 76  SpO2: 98%  Weight: 162 lb 3.2 oz (73.6 kg)  Height: 5\' 9"  (1.753 m)   Body mass index is 23.95 kg/m.  Gen: resting comfortably, no acute distress HEENT: no scleral icterus, pupils equal round and reactive, no palptable cervical adenopathy,  CV: RRR, no m/r/g no jvd. +right carotid bruit Resp: Clear to auscultation bilaterally GI: abdomen is soft, non-tender, non-distended, normal bowel sounds, no hepatosplenomegaly MSK: extremities are warm, no edema.  Skin: warm, no rash Neuro:  no focal deficits Psych: appropriate affect   Diagnostic Studies  03/2017 echo Study Conclusions   - Left ventricle: The cavity size was normal. Wall thickness was   increased in a pattern of moderate LVH. Systolic function was   moderately reduced. The estimated ejection fraction was in the   range of 35% to 40%. Diffuse hypokinesis. Doppler parameters are   consistent with abnormal left ventricular relaxation (grade 1   diastolic dysfunction). Doppler parameters are consistent with   high ventricular filling pressure. - Aortic valve: Severely calcified annulus. Severely thickened   leaflets. There was severe stenosis. Mean gradient (S): 51 mm Hg.   VTI ratio of LVOT to aortic valve: 0.11. Valve area (VTI): 0.39   cm^2. Valve area (Vmax): 0.4 cm^2. Valve area (Vmean): 0.43 cm^2. - Left atrium: The atrium was  mildly dilated. - Technically adequate study.   03/2017 Heart cath Prox RCA lesion is 10% stenosed. Ost 2nd Mrg to 2nd Mrg lesion is 10% stenosed. Prox LAD to Mid LAD lesion is 20% stenosed.   1. Mild non-obstructive CAD 2. Severe aortic stenosis by echo (unable to cross the aortic valve from the radial approach).   Recommendations: He will need referral to CT surgery to discuss surgical AVR. He is low risk for surgery and will not be a candidate for TAVR if his surgical risk is felt to be low.    Assessment and Plan   1. Aortic stenosis/bicuspid aortic valve - s/p surigcal AVR - no recent symptoms, continue to monitor   2. HTN -above goal, increase lisinopril to 5mg  daily  3. Carotid stenosis - repeat carotid US  4. Hyperlipidemia - continue crestor,  request pcp labs.   EKG today shows NSR, no ischemic changes   Arnoldo Lenis, M.D.

## 2020-12-03 ENCOUNTER — Ambulatory Visit (HOSPITAL_COMMUNITY): Admission: RE | Admit: 2020-12-03 | Payer: Medicare Other | Source: Ambulatory Visit

## 2020-12-07 ENCOUNTER — Ambulatory Visit (HOSPITAL_COMMUNITY)
Admission: RE | Admit: 2020-12-07 | Discharge: 2020-12-07 | Disposition: A | Discharge status: Home / Self Care | Payer: Medicare Other | Source: Ambulatory Visit | Attending: Cardiology | Admitting: Cardiology

## 2020-12-07 ENCOUNTER — Other Ambulatory Visit: Payer: Self-pay

## 2020-12-07 DIAGNOSIS — I10 Essential (primary) hypertension: Secondary | ICD-10-CM | POA: Diagnosis not present

## 2020-12-07 DIAGNOSIS — I6523 Occlusion and stenosis of bilateral carotid arteries: Secondary | ICD-10-CM

## 2020-12-07 DIAGNOSIS — R0989 Other specified symptoms and signs involving the circulatory and respiratory systems: Secondary | ICD-10-CM | POA: Diagnosis not present

## 2020-12-07 DIAGNOSIS — E785 Hyperlipidemia, unspecified: Secondary | ICD-10-CM | POA: Diagnosis not present

## 2020-12-08 ENCOUNTER — Telehealth: Payer: Self-pay

## 2020-12-08 ENCOUNTER — Other Ambulatory Visit: Payer: Self-pay

## 2020-12-08 DIAGNOSIS — I6523 Occlusion and stenosis of bilateral carotid arteries: Secondary | ICD-10-CM

## 2020-12-08 NOTE — Telephone Encounter (Signed)
Spoke to pt who verbalized understanding.  

## 2020-12-08 NOTE — Telephone Encounter (Signed)
-----   Message from Arnoldo Lenis, MD sent at 12/07/2020 11:19 AM EDT ----- [place] suggests progression of blockage on the right side, needs to see vascular ASAP for evaluation of carotid stenosis   Zandra Abts MD

## 2020-12-29 ENCOUNTER — Encounter: Payer: Self-pay | Admitting: *Deleted

## 2021-03-26 DIAGNOSIS — Z23 Encounter for immunization: Secondary | ICD-10-CM | POA: Diagnosis not present

## 2021-04-29 ENCOUNTER — Other Ambulatory Visit: Payer: Self-pay | Admitting: Cardiology

## 2021-05-24 ENCOUNTER — Encounter: Payer: Self-pay | Admitting: Cardiology

## 2021-05-24 DIAGNOSIS — E1165 Type 2 diabetes mellitus with hyperglycemia: Secondary | ICD-10-CM | POA: Diagnosis not present

## 2021-05-24 DIAGNOSIS — R5383 Other fatigue: Secondary | ICD-10-CM | POA: Diagnosis not present

## 2021-05-24 DIAGNOSIS — E039 Hypothyroidism, unspecified: Secondary | ICD-10-CM | POA: Diagnosis not present

## 2021-05-24 DIAGNOSIS — Z0001 Encounter for general adult medical examination with abnormal findings: Secondary | ICD-10-CM | POA: Diagnosis not present

## 2021-05-24 DIAGNOSIS — E782 Mixed hyperlipidemia: Secondary | ICD-10-CM | POA: Diagnosis not present

## 2021-05-24 DIAGNOSIS — E876 Hypokalemia: Secondary | ICD-10-CM | POA: Diagnosis not present

## 2021-05-24 DIAGNOSIS — E7849 Other hyperlipidemia: Secondary | ICD-10-CM | POA: Diagnosis not present

## 2021-05-24 DIAGNOSIS — D649 Anemia, unspecified: Secondary | ICD-10-CM | POA: Diagnosis not present

## 2021-05-27 DIAGNOSIS — Z0001 Encounter for general adult medical examination with abnormal findings: Secondary | ICD-10-CM | POA: Diagnosis not present

## 2021-05-27 DIAGNOSIS — E7849 Other hyperlipidemia: Secondary | ICD-10-CM | POA: Diagnosis not present

## 2021-05-27 DIAGNOSIS — I10 Essential (primary) hypertension: Secondary | ICD-10-CM | POA: Diagnosis not present

## 2021-05-27 DIAGNOSIS — D1721 Benign lipomatous neoplasm of skin and subcutaneous tissue of right arm: Secondary | ICD-10-CM | POA: Diagnosis not present

## 2021-05-27 DIAGNOSIS — K2100 Gastro-esophageal reflux disease with esophagitis, without bleeding: Secondary | ICD-10-CM | POA: Diagnosis not present

## 2021-08-03 ENCOUNTER — Encounter (INDEPENDENT_AMBULATORY_CARE_PROVIDER_SITE_OTHER): Payer: Self-pay | Admitting: *Deleted

## 2021-08-26 ENCOUNTER — Encounter (INDEPENDENT_AMBULATORY_CARE_PROVIDER_SITE_OTHER): Payer: Self-pay | Admitting: Gastroenterology

## 2021-08-26 ENCOUNTER — Encounter (HOSPITAL_COMMUNITY)
Admission: RE | Admit: 2021-08-26 | Discharge: 2021-08-26 | Disposition: A | Discharge status: Home / Self Care | Payer: Medicare Other | Source: Ambulatory Visit | Attending: Gastroenterology | Admitting: Gastroenterology

## 2021-08-26 ENCOUNTER — Other Ambulatory Visit (INDEPENDENT_AMBULATORY_CARE_PROVIDER_SITE_OTHER): Payer: Self-pay

## 2021-08-26 ENCOUNTER — Encounter (INDEPENDENT_AMBULATORY_CARE_PROVIDER_SITE_OTHER): Payer: Self-pay

## 2021-08-26 ENCOUNTER — Encounter (HOSPITAL_COMMUNITY): Payer: Self-pay

## 2021-08-26 ENCOUNTER — Ambulatory Visit (INDEPENDENT_AMBULATORY_CARE_PROVIDER_SITE_OTHER): Payer: Medicare Other | Admitting: Gastroenterology

## 2021-08-26 ENCOUNTER — Other Ambulatory Visit: Payer: Self-pay

## 2021-08-26 DIAGNOSIS — R1310 Dysphagia, unspecified: Secondary | ICD-10-CM

## 2021-08-26 NOTE — Progress Notes (Signed)
Jeremy Larson, M.D. ?Gastroenterology & Hepatology ?[facility] ?[address] ?[place], [place] [address] ? ?Primary Care Physician: ?Jeremy Bis, MD ?[address] ?[place] [address] ? ?I will communicate my assessment and recommendations to the referring MD via EMR. ? ?Problems: ?Dysphagia ? ?History of Present Illness: ?Jeremy Larson is a 71 y.o. male with past medical history of aortic stenosis status post aortic valve replacement in 9379, chronic systolic heart failure (most recent ejection fraction 60-65%) , who presents for evaluation of dysphagia. ? ?The patient was last seen on 09/03/2018. At that time, the patient evaluated for positive Cologuard esophagogastroduodenospy that improved with PPI so he was continued on Protonix 40 mg every day. ? ?Patient reports that a couple of weeks ago he noticed some "swelling in the left side of his neck" as he was feeling discomfort in the left side of his neck. He also noticed that solid food was taking longer to go down. Noticed it happened with food such as mashed potatoes and rice. He was advised to eat popsicles by his sister (she is a Therapist, sports) and it improved the "swelling" sensation. He is currently eating a regular meal but is chewing his food thoroughly. He was eating softer foods, but he had a regular meal yesterday. ? ?The patient denies having any heartburn, odynophagia, nausea, vomiting, fever, chills, hematochezia, melena, hematemesis, abdominal distention, abdominal pain, diarrhea, jaundice, pruritus.  Lost 7 lb in the last couple of weeks due to decreased food intake as he was fearful the food would get stuck in his chest. ? ?He is currently on pantoprazole 40 mg qday for management of GERD. ? ?Last EGD: never ?Last Colonoscopy: 09/08/2020  ?- The examined portion of the ileum was normal. ?- Diverticulosis in the sigmoid colon. ?- The distal rectum and anal verge are normal on retroflexion view. ? ?Past  Medical History: ?Past Medical History:  ?Diagnosis Date  ? Aortic stenosis   ? a. s/p AVR on 05/18/2017 with 23 mm Edwards Magna-Ease Pericardial valve  ? Chronic systolic (congestive) heart failure ([facility])   ? a. 05/2017: TEE showing EF 40-45%  ? Dyspnea   ? ? ?Past Surgical History: ?Past Surgical History:  ?Procedure Laterality Date  ? AORTIC VALVE REPLACEMENT N/A 05/18/2017  ? Procedure: AORTIC VALVE REPLACEMENT (AVR),TEE;  Surgeon: Gaye Pollack, MD;  Location: [place];  Service: Open Heart Surgery;  Laterality: N/A;  ? COLONOSCOPY WITH PROPOFOL N/A 09/08/2020  ? Procedure: COLONOSCOPY WITH PROPOFOL;  Surgeon: Harvel Quale, MD;  [facility];  Service: Gastroenterology;  Laterality: N/A;  11:15 AM  ? HAND SURGERY    ? left hang following trauma  ? RIGHT/LEFT HEART CATH AND CORONARY ANGIOGRAPHY N/A 04/12/2017  ? Procedure: RIGHT/LEFT HEART CATH AND CORONARY ANGIOGRAPHY;  Surgeon: Burnell Blanks, MD;  Location: [place] [facility];  Service: Cardiovascular;  Laterality: N/A;  ? TEE WITHOUT CARDIOVERSION N/A 05/18/2017  ? Procedure: TRANSESOPHAGEAL ECHOCARDIOGRAM (TEE);  Surgeon: Gaye Pollack, MD;  Location: [place];  Service: Open Heart Surgery;  Laterality: N/A;  ? ? ?Family History: ?Family History  ?Problem Relation Age of Onset  ? Lung cancer Other   ?     brother and father  ? Heart murmur Other   ?     mother  ? Cancer Mother   ? Heart failure Mother   ? Cancer Father   ? Cancer Brother   ? Cirrhosis Brother   ?  Alcohol abuse Brother   ? ? ?Social History: ?Social History  ? ?Tobacco Use  ?Smoking Status Former  ? Packs/day: 1.00  ? Years: 10.00  ? Pack years: 10.00  ? Types: Cigarettes  ? Passive exposure: Past  ?Smokeless Tobacco Current  ? Types: Snuff  ? ?Social History  ? ?Substance and Sexual Activity  ?Alcohol Use No  ? ?Social History  ? ?Substance and Sexual Activity  ?Drug Use No  ? ? ?Allergies: ?No Known Allergies ? ?Medications: ?Current Outpatient Medications   ?Medication Sig Dispense Refill  ? aspirin EC 81 MG tablet Take 81 mg by mouth daily.    ? cetirizine (ZYRTEC) 10 MG tablet Take 1 tablet by mouth as needed for allergies.    ? lisinopril (ZESTRIL) 5 MG tablet Take 1 tablet (5 mg total) by mouth daily. 90 tablet 3  ? metoprolol succinate (TOPROL-XL) 25 MG 24 hr tablet TAKE ONE TABLET BY MOUTH DAILY 90 tablet 1  ? Multiple Vitamin (MULTIVITAMIN) tablet Take 1 tablet by mouth daily.    ? pantoprazole (PROTONIX) 40 MG tablet Take 40 mg by mouth daily.    ? rosuvastatin (CRESTOR) 5 MG tablet Take 5 mg by mouth daily.    ? ?No current facility-administered medications for this visit.  ? ? ?Review of Systems: ?GENERAL: negative for malaise, night sweats ?HEENT: No changes in hearing or vision, no nose bleeds or other nasal problems. ?NECK: Negative for lumps, goiter, pain and significant neck swelling ?RESPIRATORY: Negative for cough, wheezing ?CARDIOVASCULAR: Negative for chest pain, leg swelling, palpitations, orthopnea ?GI: SEE HPI ?MUSCULOSKELETAL: Negative for joint pain or swelling, back pain, and muscle pain. ?SKIN: Negative for lesions, rash ?PSYCH: Negative for sleep disturbance, mood disorder and recent psychosocial stressors. ?HEMATOLOGY Negative for prolonged bleeding, bruising easily, and swollen nodes. ?ENDOCRINE: Negative for cold or heat intolerance, polyuria, polydipsia and goiter. ?NEURO: negative for tremor, gait imbalance, syncope and seizures. ?The remainder of the review of systems is noncontributory. ? ? ?Physical Exam: ?BP 127/84 (BP Location: Left Arm, Patient Position: Sitting, Cuff Size: Large)   Pulse 76   Temp 98 ?F (36.7 ?C) (Oral)   Ht '5\' 9"'$  (1.753 m)   Wt 162 lb 1.6 oz (73.5 kg)   BMI 23.94 kg/m?  ?GENERAL: The patient is AO x3, in no acute distress. ?HEENT: Head is normocephalic and atraumatic. EOMI are intact. Mouth is well hydrated and without lesions. ?NECK: Supple. No masses ?LUNGS: Clear to auscultation. No presence of  rhonchi/wheezing/rales. Adequate chest expansion ?HEART: RRR, normal s1 and s2. ?ABDOMEN: Soft, nontender, no guarding, no peritoneal signs, and nondistended. BS +. No masses. ?EXTREMITIES: Without any cyanosis, clubbing, rash, lesions or edema. ?NEUROLOGIC: AOx3, no focal motor deficit. ?SKIN: no jaundice, no rashes ? ?Imaging/Labs: ?as above ? ?I personally reviewed and interpreted the available labs, imaging and endoscopic files. ? ?Impression and Plan: ?Jeremy Larson is a 71 y.o. male with past medical history of aortic stenosis status post aortic valve replacement in 1610, chronic systolic heart failure (most recent ejection fraction 60-65%) , who presents for evaluation of dysphagia. He has presented new onset of throat discomfort and transient dysphagia that seems to have resolved. However, given the fact he is concerned about his symptoms despite taking PPI compliantly, we will evaluate him further with an EGD with possible ED. ? ?- Schedule EGD with ED ? ?All questions were answered.     ? ?Harvel Quale, MD ?Gastroenterology and Hepatology ?[facility] ? ? ?

## 2021-08-26 NOTE — H&P (View-Only) (Signed)
[name], M.D. ?Gastroenterology & Hepatology ?[facility] ?[address] ?[place], [place] [address] ? ?Primary Care Physician: ?Caryl Bis, MD ?[address] ?[place] [address] ? ?I will communicate my assessment and recommendations to the referring MD via EMR. ? ?Problems: ?Dysphagia ? ?History of Present Illness: ?Jeremy Larson is a 71 y.o. male with past medical history of aortic stenosis status post aortic valve replacement in 6283, chronic systolic heart failure (most recent ejection fraction 60-65%) , who presents for evaluation of dysphagia. ? ?The patient was last seen on 09/03/2018. At that time, the patient evaluated for positive Cologuard esophagogastroduodenospy that improved with PPI so he was continued on Protonix 40 mg every day. ? ?Patient reports that a couple of weeks ago he noticed some "swelling in the left side of his neck" as he was feeling discomfort in the left side of his neck. He also noticed that solid food was taking longer to go down. Noticed it happened with food such as mashed potatoes and rice. He was advised to eat popsicles by his sister (she is a Therapist, sports) and it improved the "swelling" sensation. He is currently eating a regular meal but is chewing his food thoroughly. He was eating softer foods, but he had a regular meal yesterday. ? ?The patient denies having any heartburn, odynophagia, nausea, vomiting, fever, chills, hematochezia, melena, hematemesis, abdominal distention, abdominal pain, diarrhea, jaundice, pruritus.  Lost 7 lb in the last couple of weeks due to decreased food intake as he was fearful the food would get stuck in his chest. ? ?He is currently on pantoprazole 40 mg qday for management of GERD. ? ?Last EGD: never ?Last Colonoscopy: 09/08/2020  ?- The examined portion of the ileum was normal. ?- Diverticulosis in the sigmoid colon. ?- The distal rectum and anal verge are normal on retroflexion view. ? ?Past  Medical History: ?Past Medical History:  ?Diagnosis Date  ? Aortic stenosis   ? a. s/p AVR on 05/18/2017 with 23 mm Edwards Magna-Ease Pericardial valve  ? Chronic systolic (congestive) heart failure (HCC)   ? a. 05/2017: TEE showing EF 40-45%  ? Dyspnea   ? ? ?Past Surgical History: ?Past Surgical History:  ?Procedure Laterality Date  ? AORTIC VALVE REPLACEMENT N/A 05/18/2017  ? Procedure: AORTIC VALVE REPLACEMENT (AVR),TEE;  Surgeon: Gaye Pollack, MD;  Location: [facility];  Service: Open Heart Surgery;  Laterality: N/A;  ? COLONOSCOPY WITH PROPOFOL N/A 09/08/2020  ? Procedure: COLONOSCOPY WITH PROPOFOL;  Surgeon: Harvel Quale, MD;  [facility];  Service: Gastroenterology;  Laterality: N/A;  11:15 AM  ? HAND SURGERY    ? left hang following trauma  ? RIGHT/LEFT HEART CATH AND CORONARY ANGIOGRAPHY N/A 04/12/2017  ? Procedure: RIGHT/LEFT HEART CATH AND CORONARY ANGIOGRAPHY;  Surgeon: Burnell Blanks, MD;  Location: [facility];  Service: Cardiovascular;  Laterality: N/A;  ? TEE WITHOUT CARDIOVERSION N/A 05/18/2017  ? Procedure: TRANSESOPHAGEAL ECHOCARDIOGRAM (TEE);  Surgeon: Gaye Pollack, MD;  Location: [facility];  Service: Open Heart Surgery;  Laterality: N/A;  ? ? ?Family History: ?Family History  ?Problem Relation Age of Onset  ? Lung cancer Other   ?     brother and father  ? Heart murmur Other   ?     mother  ? Cancer Mother   ? Heart failure Mother   ? Cancer Father   ? Cancer Brother   ? Cirrhosis Brother   ?  Alcohol abuse Brother   ? ? ?Social History: ?Social History  ? ?Tobacco Use  ?Smoking Status Former  ? Packs/day: 1.00  ? Years: 10.00  ? Pack years: 10.00  ? Types: Cigarettes  ? Passive exposure: Past  ?Smokeless Tobacco Current  ? Types: Snuff  ? ?Social History  ? ?Substance and Sexual Activity  ?Alcohol Use No  ? ?Social History  ? ?Substance and Sexual Activity  ?Drug Use No  ? ? ?Allergies: ?No Known Allergies ? ?Medications: ?Current Outpatient Medications   ?Medication Sig Dispense Refill  ? aspirin EC 81 MG tablet Take 81 mg by mouth daily.    ? cetirizine (ZYRTEC) 10 MG tablet Take 1 tablet by mouth as needed for allergies.    ? lisinopril (ZESTRIL) 5 MG tablet Take 1 tablet (5 mg total) by mouth daily. 90 tablet 3  ? metoprolol succinate (TOPROL-XL) 25 MG 24 hr tablet TAKE ONE TABLET BY MOUTH DAILY 90 tablet 1  ? Multiple Vitamin (MULTIVITAMIN) tablet Take 1 tablet by mouth daily.    ? pantoprazole (PROTONIX) 40 MG tablet Take 40 mg by mouth daily.    ? rosuvastatin (CRESTOR) 5 MG tablet Take 5 mg by mouth daily.    ? ?No current facility-administered medications for this visit.  ? ? ?Review of Systems: ?GENERAL: negative for malaise, night sweats ?HEENT: No changes in hearing or vision, no nose bleeds or other nasal problems. ?NECK: Negative for lumps, goiter, pain and significant neck swelling ?RESPIRATORY: Negative for cough, wheezing ?CARDIOVASCULAR: Negative for chest pain, leg swelling, palpitations, orthopnea ?GI: SEE HPI ?MUSCULOSKELETAL: Negative for joint pain or swelling, back pain, and muscle pain. ?SKIN: Negative for lesions, rash ?PSYCH: Negative for sleep disturbance, mood disorder and recent psychosocial stressors. ?HEMATOLOGY Negative for prolonged bleeding, bruising easily, and swollen nodes. ?ENDOCRINE: Negative for cold or heat intolerance, polyuria, polydipsia and goiter. ?NEURO: negative for tremor, gait imbalance, syncope and seizures. ?The remainder of the review of systems is noncontributory. ? ? ?Physical Exam: ?BP 127/84 (BP Location: Left Arm, Patient Position: Sitting, Cuff Size: Large)   Pulse 76   Temp 98 ?F (36.7 ?C) (Oral)   Ht '5\' 9"'$  (1.753 m)   Wt 162 lb 1.6 oz (73.5 kg)   BMI 23.94 kg/m?  ?GENERAL: The patient is AO x3, in no acute distress. ?HEENT: Head is normocephalic and atraumatic. EOMI are intact. Mouth is well hydrated and without lesions. ?NECK: Supple. No masses ?LUNGS: Clear to auscultation. No presence of  rhonchi/wheezing/rales. Adequate chest expansion ?HEART: RRR, normal s1 and s2. ?ABDOMEN: Soft, nontender, no guarding, no peritoneal signs, and nondistended. BS +. No masses. ?EXTREMITIES: Without any cyanosis, clubbing, rash, lesions or edema. ?NEUROLOGIC: AOx3, no focal motor deficit. ?SKIN: no jaundice, no rashes ? ?Imaging/Labs: ?as above ? ?I personally reviewed and interpreted the available labs, imaging and endoscopic files. ? ?Impression and Plan: ?Jeremy Larson is a 71 y.o. male with past medical history of aortic stenosis status post aortic valve replacement in 2683, chronic systolic heart failure (most recent ejection fraction 60-65%) , who presents for evaluation of dysphagia. He has presented new onset of throat discomfort and transient dysphagia that seems to have resolved. However, given the fact he is concerned about his symptoms despite taking PPI compliantly, we will evaluate him further with an EGD with possible ED. ? ?- Schedule EGD with ED ? ?All questions were answered.     ? ?Harvel Quale, MD ?Gastroenterology and Hepatology ?[facility] ? ? ?

## 2021-08-26 NOTE — Pre-Procedure Instructions (Signed)
Attempted pre-op phone call. Left message for patient to call us back. ?

## 2021-08-26 NOTE — Patient Instructions (Signed)
Schedule EGD with ED ?

## 2021-08-27 ENCOUNTER — Ambulatory Visit (HOSPITAL_BASED_OUTPATIENT_CLINIC_OR_DEPARTMENT_OTHER): Payer: Medicare Other | Admitting: Certified Registered Nurse Anesthetist

## 2021-08-27 ENCOUNTER — Encounter (HOSPITAL_COMMUNITY): Payer: Self-pay | Admitting: Gastroenterology

## 2021-08-27 ENCOUNTER — Ambulatory Visit (HOSPITAL_COMMUNITY)
Admission: RE | Admit: 2021-08-27 | Discharge: 2021-08-27 | Disposition: A | Discharge status: Home / Self Care | Payer: Medicare Other | Attending: Gastroenterology | Admitting: Gastroenterology

## 2021-08-27 ENCOUNTER — Encounter (HOSPITAL_COMMUNITY)
Admission: RE | Disposition: A | Discharge status: Home / Self Care | Payer: Self-pay | Source: Home / Self Care | Attending: Gastroenterology

## 2021-08-27 ENCOUNTER — Ambulatory Visit (HOSPITAL_COMMUNITY): Payer: Medicare Other | Admitting: Certified Registered Nurse Anesthetist

## 2021-08-27 ENCOUNTER — Other Ambulatory Visit: Payer: Self-pay

## 2021-08-27 DIAGNOSIS — R1310 Dysphagia, unspecified: Secondary | ICD-10-CM

## 2021-08-27 DIAGNOSIS — K3189 Other diseases of stomach and duodenum: Secondary | ICD-10-CM | POA: Diagnosis not present

## 2021-08-27 DIAGNOSIS — Z87891 Personal history of nicotine dependence: Secondary | ICD-10-CM | POA: Diagnosis not present

## 2021-08-27 DIAGNOSIS — I350 Nonrheumatic aortic (valve) stenosis: Secondary | ICD-10-CM | POA: Insufficient documentation

## 2021-08-27 DIAGNOSIS — I5022 Chronic systolic (congestive) heart failure: Secondary | ICD-10-CM | POA: Diagnosis not present

## 2021-08-27 DIAGNOSIS — Z79899 Other long term (current) drug therapy: Secondary | ICD-10-CM | POA: Insufficient documentation

## 2021-08-27 DIAGNOSIS — K219 Gastro-esophageal reflux disease without esophagitis: Secondary | ICD-10-CM | POA: Diagnosis not present

## 2021-08-27 DIAGNOSIS — I110 Hypertensive heart disease with heart failure: Secondary | ICD-10-CM

## 2021-08-27 DIAGNOSIS — Z952 Presence of prosthetic heart valve: Secondary | ICD-10-CM | POA: Diagnosis not present

## 2021-08-27 DIAGNOSIS — J029 Acute pharyngitis, unspecified: Secondary | ICD-10-CM | POA: Diagnosis not present

## 2021-08-27 DIAGNOSIS — I509 Heart failure, unspecified: Secondary | ICD-10-CM | POA: Diagnosis not present

## 2021-08-27 DIAGNOSIS — Z951 Presence of aortocoronary bypass graft: Secondary | ICD-10-CM | POA: Diagnosis not present

## 2021-08-27 DIAGNOSIS — K319 Disease of stomach and duodenum, unspecified: Secondary | ICD-10-CM | POA: Diagnosis not present

## 2021-08-27 HISTORY — PX: ESOPHAGOGASTRODUODENOSCOPY (EGD) WITH PROPOFOL: SHX5813

## 2021-08-27 HISTORY — PX: BIOPSY: SHX5522

## 2021-08-27 HISTORY — PX: ESOPHAGEAL DILATION: SHX303

## 2021-08-27 SURGERY — ESOPHAGOGASTRODUODENOSCOPY (EGD) WITH PROPOFOL
Anesthesia: General

## 2021-08-27 MED ORDER — PROPOFOL 10 MG/ML IV BOLUS
INTRAVENOUS | Status: DC | PRN
  Administered 2021-08-27: 100 mg via INTRAVENOUS

## 2021-08-27 MED ORDER — PROPOFOL 500 MG/50ML IV EMUL
INTRAVENOUS | Status: DC | PRN
  Administered 2021-08-27: 150 ug/kg/min via INTRAVENOUS

## 2021-08-27 MED ORDER — LIDOCAINE HCL (CARDIAC) PF 100 MG/5ML IV SOSY
PREFILLED_SYRINGE | INTRAVENOUS | Status: DC | PRN
  Administered 2021-08-27: 50 mg via INTRAVENOUS

## 2021-08-27 MED ORDER — LACTATED RINGERS IV SOLN: INTRAVENOUS | Status: DC | PRN

## 2021-08-27 NOTE — Discharge Instructions (Addendum)
You are being discharged to home.  ?Resume your previous diet.  ?We are waiting for your pathology results.  ?Referral for EUS - evaluation of gastric submucosal lesion. ?Continue your present medications.  ?

## 2021-08-27 NOTE — Op Note (Signed)
[facility] ?Patient Name: Jeremy Larson ?Procedure Date: 08/27/2021 10:47 AM ?MRN: [id number] ?Date of Birth: 06-03-1950 ?Attending MD: [name] ,  ?CSN: [id number] ?Age: 71 ?Admit Type: Outpatient ?Procedure:                Upper GI endoscopy ?Indications:              Dysphagia, Sore throat ?Providers:                [name], Charlsie Quest Theda Sers RN, RN, Kenney Houseman  ?                          [place] ?Referring MD:              ?Medicines:                Monitored Anesthesia Care ?Complications:            No immediate complications. ?Estimated Blood Loss:     Estimated blood loss: none. ?Procedure:                Pre-Anesthesia Assessment: ?                          - Prior to the procedure, a History and Physical  ?                          was performed, and patient medications, allergies  ?                          and sensitivities were reviewed. The patient's  ?                          tolerance of previous anesthesia was reviewed. ?                          - The risks and benefits of the procedure and the  ?                          sedation options and risks were discussed with the  ?                          patient. All questions were answered and informed  ?                          consent was obtained. ?                          - ASA Grade Assessment: II - A patient with mild  ?                          systemic disease. ?                          After obtaining informed consent, the endoscope was  ?                          passed under direct vision. Throughout the  ?  procedure, the patient's blood pressure, pulse, and  ?                          oxygen saturations were monitored continuously. The  ?                          GIF-H190 ([id number]) scope was introduced through the  ?                          mouth, and advanced to the second part of duodenum.  ?                          The upper GI endoscopy was accomplished without  ?                           difficulty. The patient tolerated the procedure  ?                          well. ?Scope In: 11:04:27 AM ?Scope Out: 11:16:29 AM ?Total Procedure Duration: 0 hours 12 minutes 2 seconds  ?Findings: ?     No endoscopic abnormality was evident in the esophagus to explain the  ?     patient's complaint of dysphagia. It was decided, however, to proceed  ?     with dilation. A guidewire was placed and the scope was withdrawn.  ?     Dilation was performed with a Savary dilator with no resistance at 18  ?     mm. No mucosal disruption was seen upon reinspection. Biopsies were  ?     obtained from the proximal and distal esophagus with cold forceps for  ?     histology of suspected eosinophilic esophagitis. ?     A single 10 mm submucosal papule (nodule) with no bleeding was found on  ?     the anterior wall of the gastric body. Imaging was performed using white  ?     light and narrow band imaging to visualize the mucosa. ?     The examined duodenum was normal. ?Impression:               - No endoscopic esophageal abnormality to explain  ?                          patient's dysphagia. Esophagus dilated. Dilated.  ?                          Biopsied. ?                          - A single submucosal papule (nodule) found in the  ?                          stomach. ?                          - Normal examined duodenum. ?Moderate Sedation: ?     Per Anesthesia Care ?Recommendation:           - Discharge patient to home (ambulatory). ?                          -  Resume previous diet. ?                          - Await pathology results. ?                          - Referral for EUS - evaluation of gastric  ?                          submucosal lesion. ?                          - Continue present medications. ?Procedure Code(s):        --- Professional --- ?                          [phone], Esophagogastroduodenoscopy, flexible,  ?                          transoral; with insertion of guide wire followed by  ?                           passage of dilator(s) through esophagus over guide  ?                          wire ?                          43239, 59, Esophagogastroduodenoscopy, flexible,  ?                          transoral; with biopsy, single or multiple ?Diagnosis Code(s):        --- Professional --- ?                          R13.10, Dysphagia, unspecified ?                          K31.89, Other diseases of stomach and duodenum ?                          J02.9, Acute pharyngitis, unspecified ?CPT copyright 2019 American Medical Association. All rights reserved. ?The codes documented in this report are preliminary and upon coder review may  ?be revised to meet current compliance requirements. ?[name], MD ?[name],  ?08/27/2021 11:24:51 AM ?This report has been signed electronically. ?Number of Addenda: 0 ?

## 2021-08-27 NOTE — Interval H&P Note (Signed)
History and Physical Interval Note: ? ?08/27/2021 ?10:09 AM ? ?[name]  has presented today for surgery, with the diagnosis of Dysphagia.  The various methods of treatment have been discussed with the patient and family. After consideration of risks, benefits and other options for treatment, the patient has consented to  Procedure(s) with comments: ?ESOPHAGOGASTRODUODENOSCOPY (EGD) WITH PROPOFOL (N/A) - 1215 ?ESOPHAGEAL DILATION (N/A) as a surgical intervention.  The patient's history has been reviewed, patient examined, no change in status, stable for surgery.  I have reviewed the patient's chart and labs.  Questions were answered to the patient's satisfaction.   ? ? ?Jeremy Larson ? ? ?

## 2021-08-27 NOTE — Anesthesia Postprocedure Evaluation (Signed)
Anesthesia Post Note ? ?Patient: Jeremy Larson ? ?Procedure(s) Performed: ESOPHAGOGASTRODUODENOSCOPY (EGD) WITH PROPOFOL ?ESOPHAGEAL DILATION ?BIOPSY ? ?Patient location during evaluation: Phase II ?Anesthesia Type: General ?Level of consciousness: awake and alert and oriented ?Pain management: pain level controlled ?Vital Signs Assessment: post-procedure vital signs reviewed and stable ?Respiratory status: spontaneous breathing, nonlabored ventilation and respiratory function stable ?Cardiovascular status: blood pressure returned to baseline and stable ?Postop Assessment: no apparent nausea or vomiting ?Anesthetic complications: no ? ? ?No notable events documented. ? ? ?Last Vitals:  ?Vitals:  ? 08/27/21 1041 08/27/21 1121  ?BP: 131/83 (!) 101/49  ?Pulse:  69  ?Resp: 12 18  ?Temp: 36.6 ?C 36.6 ?C  ?SpO2: 100% 96%  ?  ?Last Pain:  ?Vitals:  ? 08/27/21 1121  ?TempSrc: Axillary  ?PainSc: 0-No pain  ? ? ?  ?  ?  ?  ?  ?  ? ?Tazia Illescas C Dameisha Tschida ? ? ? ? ?

## 2021-08-27 NOTE — Transfer of Care (Signed)
Immediate Anesthesia Transfer of Care Note ? ?Patient: Jeremy Larson ? ?Procedure(s) Performed: ESOPHAGOGASTRODUODENOSCOPY (EGD) WITH PROPOFOL ?ESOPHAGEAL DILATION ?BIOPSY ? ?Patient Location: Endoscopy Unit ? ?Anesthesia Type:General ? ?Level of Consciousness: drowsy ? ?Airway & Oxygen Therapy: Patient Spontanous Breathing ? ?Post-op Assessment: Report given to RN and Post -op Vital signs reviewed and stable ? ?Post vital signs: Reviewed and stable ? ?Last Vitals:  ?Vitals Value Taken Time  ?BP    ?Temp    ?Pulse 73   ?Resp 22   ?SpO2 94%   ? ? ?Last Pain:  ?Vitals:  ? 08/27/21 1103  ?TempSrc:   ?PainSc: 0-No pain  ?   ? ?Patients Stated Pain Goal: 7 (08/27/21 1041) ? ?Complications: No notable events documented. ?

## 2021-08-27 NOTE — Anesthesia Preprocedure Evaluation (Addendum)
Anesthesia Evaluation  ?Patient identified by MRN, date of birth, ID band ?Patient awake ? ? ? ?Reviewed: ?Allergy & Precautions, NPO status , Patient's Chart, lab work & pertinent test results, reviewed documented beta blocker date and time  ? ?Airway ?Mallampati: II ? ?TM Distance: >3 FB ?Neck ROM: Full ? ? ? Dental ? ?(+) Upper Dentures, Lower Dentures ?  ?Pulmonary ?neg pulmonary ROS, former smoker,  ?  ?Pulmonary exam normal ?breath sounds clear to auscultation ? ? ? ? ? ? Cardiovascular ?Exercise Tolerance: Good ?hypertension, Pt. on medications and Pt. on home beta blockers ?+ CABG and +CHF  ?Normal cardiovascular exam+ Valvular Problems/Murmurs (AVR)  ?Rhythm:Regular Rate:Normal ? ? ?  ?Neuro/Psych ?negative neurological ROS ? negative psych ROS  ? GI/Hepatic ?Neg liver ROS, GERD  Medicated and Controlled,  ?Endo/Other  ?negative endocrine ROS ? Renal/GU ?negative Renal ROS  ?negative genitourinary ?  ?Musculoskeletal ?negative musculoskeletal ROS ?(+)  ? Abdominal ?  ?Peds ?negative pediatric ROS ?(+)  Hematology ?negative hematology ROS ?(+)   ?Anesthesia Other Findings ? ? Reproductive/Obstetrics ?negative OB ROS ? ?  ? ? ? ? ? ? ? ? ? ? ? ? ? ?  ?  ? ? ? ? ? ? ? ?Anesthesia Physical ?Anesthesia Plan ? ?ASA: 3 ? ?Anesthesia Plan: General  ? ?Post-op Pain Management: Minimal or no pain anticipated  ? ?Induction: Intravenous ? ?PONV Risk Score and Plan: Propofol infusion ? ?Airway Management Planned: Nasal Cannula and Natural Airway ? ?Additional Equipment:  ? ?Intra-op Plan:  ? ?Post-operative Plan:  ? ?Informed Consent: I have reviewed the patients History and Physical, chart, labs and discussed the procedure including the risks, benefits and alternatives for the proposed anesthesia with the patient or authorized representative who has indicated his/her understanding and acceptance.  ? ? ? ? ? ?Plan Discussed with: CRNA and Surgeon ? ?Anesthesia Plan Comments:    ? ? ? ? ? ?Anesthesia Quick Evaluation ? ?

## 2021-08-30 ENCOUNTER — Telehealth: Payer: Self-pay

## 2021-08-30 LAB — SURGICAL PATHOLOGY

## 2021-08-30 NOTE — Telephone Encounter (Signed)
-----   Message from Irving Copas., MD sent at 08/30/2021  4:38 PM EDT ----- ?Regarding: RE: EUS ?Gorden Stthomas, ?Nonurgent EUS in 3 to 6 months with DJ or myself is fine based on the size of this lesion.  May be ideal to have a CT scan of the abdomen performed in case this lesion can be visualized or define further in the interim.  Please forward that information to the referring provider team. ?EGD radial/linear.  Thanks. ?GM ?----- Message ----- ?From: Timothy Lasso, RN ?Sent: 08/30/2021   3:04 PM EDT ?To: Milus Banister, MD, # ?Subject: FW: EUS                                       ? ? ?----- Message ----- ?From: Worthy Keeler ?Sent: 08/30/2021   3:02 PM EDT ?To: Timothy Lasso, RN, # ?Subject: EUS                                           ? ?Referral for EUS - evaluation of gastric submucosal lesion. Thanks, Lelon Frohlich ? ? ? ?

## 2021-08-31 ENCOUNTER — Encounter (HOSPITAL_COMMUNITY): Payer: Self-pay | Admitting: Gastroenterology

## 2021-08-31 NOTE — Telephone Encounter (Signed)
I have sent the recommendation to the referring.  I have also entered the recall EUS  ?

## 2021-09-13 ENCOUNTER — Other Ambulatory Visit: Payer: Self-pay | Admitting: Cardiology

## 2021-10-26 ENCOUNTER — Other Ambulatory Visit: Payer: Self-pay | Admitting: Cardiology

## 2021-10-27 NOTE — Telephone Encounter (Signed)
This is a Nurse, mental health pt, Dr. Harl Bowie

## 2021-12-08 ENCOUNTER — Encounter: Payer: Self-pay | Admitting: Cardiology

## 2021-12-08 ENCOUNTER — Encounter: Payer: Self-pay | Admitting: *Deleted

## 2021-12-08 ENCOUNTER — Ambulatory Visit (INDEPENDENT_AMBULATORY_CARE_PROVIDER_SITE_OTHER): Payer: Medicare Other | Admitting: Cardiology

## 2021-12-08 VITALS — BP 110/70 | HR 71 | Ht 69.0 in | Wt 165.8 lb

## 2021-12-08 DIAGNOSIS — E782 Mixed hyperlipidemia: Secondary | ICD-10-CM

## 2021-12-08 DIAGNOSIS — I10 Essential (primary) hypertension: Secondary | ICD-10-CM | POA: Diagnosis not present

## 2021-12-08 DIAGNOSIS — I6523 Occlusion and stenosis of bilateral carotid arteries: Secondary | ICD-10-CM

## 2021-12-08 DIAGNOSIS — Z952 Presence of prosthetic heart valve: Secondary | ICD-10-CM

## 2021-12-08 NOTE — Progress Notes (Signed)
Clinical Summary Jeremy Larson is a 71 y.o.male seen today for follow up of the following medical problems.    1. Aortic stenosis/Bicuspid aortic valve - probable bicuspid AV by prior echo. - echo 01/2010 LVEF 60-65%, bicuspid AV mod AS mean grad 34 and AVA VTI 1.12 03/2017 echo: LVEF 47-09%, grade I diastolic dysfunction, AV mean grad 51, AVA VTI 0.39. 03/2017 cath: mild nonsobstructive CAD. Normal filling pressures by [facility] 03/2017 CTA Chest/Abd/Pelvis: no aortic pathology   - s/p surgical AVR with 23 mm [facility] Ease pericardial valve Jan 2019 - f/u echo 07/2017 shows LVEF 62-83%, grade I diastolic dysfunction, normal functioning AVR.     - no SOB/DOE. No LE edea, no chest pains       2. HTN - he is compliant with medications   3. Hyperlipidemia - labs followed by pcp - he is on crestor     4. 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 Going to get a PhD.   Past Medical History:  Diagnosis Date   Aortic stenosis    a. s/p AVR on 05/18/2017 with 23 mm Edwards Magna-Ease Pericardial valve   Chronic systolic (congestive) heart failure ([place])    a. 05/2017: TEE showing EF 40-45%   Dyspnea      No Known Allergies   Current Outpatient Medications  Medication Sig Dispense Refill   aspirin EC 81 MG tablet Take 81 mg by mouth daily.     cetirizine (ZYRTEC) 10 MG tablet Take 1 tablet by mouth as needed for allergies.     lisinopril (ZESTRIL) 5 MG tablet TAKE ONE TABLET BY MOUTH ONCE DAILY 90 tablet 3   metoprolol succinate (TOPROL-XL) 25 MG 24 hr tablet TAKE ONE TABLET BY MOUTH DAILY 30 tablet 0   Multiple Vitamin (MULTIVITAMIN) tablet  Take 1 tablet by mouth daily.     pantoprazole (PROTONIX) 40 MG tablet Take 40 mg by mouth daily.     rosuvastatin (CRESTOR) 5 MG tablet Take 5 mg by mouth daily.     No current facility-administered medications for this visit.     Past Surgical History:  Procedure Laterality Date   AORTIC VALVE REPLACEMENT N/A 05/18/2017   Procedure: AORTIC VALVE REPLACEMENT (AVR),TEE;  Surgeon: Gaye Pollack, MD;  Location: [place];  Service: Open Heart Surgery;  Laterality: N/A;   BIOPSY  08/27/2021   Procedure: BIOPSY;  Surgeon: Harvel Quale, MD;  [facility];  Service: Gastroenterology;;   COLONOSCOPY WITH PROPOFOL N/A 09/08/2020   Procedure: COLONOSCOPY WITH PROPOFOL;  Surgeon: Harvel Quale, MD;  [facility];  Service: Gastroenterology;  Laterality: N/A;  11:15 AM   ESOPHAGEAL DILATION N/A 08/27/2021   Procedure: ESOPHAGEAL DILATION;  Surgeon: Harvel Quale, MD;  [facility];  Service: Gastroenterology;  Laterality: N/A;   ESOPHAGOGASTRODUODENOSCOPY (EGD) WITH PROPOFOL N/A 08/27/2021   Procedure: ESOPHAGOGASTRODUODENOSCOPY (EGD) WITH PROPOFOL;  Surgeon: Harvel Quale, MD;  [facility];  Service: Gastroenterology;  Laterality: N/A;  1215   HAND SURGERY     left hang following trauma  RIGHT/LEFT HEART CATH AND CORONARY ANGIOGRAPHY N/A 04/12/2017   Procedure: RIGHT/LEFT HEART CATH AND CORONARY ANGIOGRAPHY;  Surgeon: Burnell Blanks, MD;  Location: [facility];  Service: Cardiovascular;  Laterality: N/A;   TEE WITHOUT CARDIOVERSION N/A 05/18/2017   Procedure: TRANSESOPHAGEAL ECHOCARDIOGRAM (TEE);  Surgeon: Gaye Pollack, MD;  Location: [facility];  Service: Open Heart Surgery;  Laterality: N/A;     No Known Allergies    Family History  Problem Relation Age of Onset   Lung cancer Other        brother and father   Heart murmur Other        mother   Cancer Mother    Heart failure Mother     Cancer Father    Cancer Brother    Cirrhosis Brother    Alcohol abuse Brother      Social History Jeremy Larson reports that he has quit smoking. His smoking use included cigarettes. He has a 10.00 pack-year smoking history. He has been exposed to tobacco smoke. His smokeless tobacco use includes snuff. Jeremy Larson reports no history of alcohol use.   Review of Systems CONSTITUTIONAL: No weight loss, fever, chills, weakness or fatigue.  HEENT: Eyes: No visual loss, blurred vision, double vision or yellow sclerae.No hearing loss, sneezing, congestion, runny nose or sore throat.  SKIN: No rash or itching.  CARDIOVASCULAR: per hpi RESPIRATORY: No shortness of breath, cough or sputum.  GASTROINTESTINAL: No anorexia, nausea, vomiting or diarrhea. No abdominal pain or blood.  GENITOURINARY: No burning on urination, no polyuria NEUROLOGICAL: No headache, dizziness, syncope, paralysis, ataxia, numbness or tingling in the extremities. No change in bowel or bladder control.  MUSCULOSKELETAL: No muscle, back pain, joint pain or stiffness.  LYMPHATICS: No enlarged nodes. No history of splenectomy.  PSYCHIATRIC: No history of depression or anxiety.  ENDOCRINOLOGIC: No reports of sweating, cold or heat intolerance. No polyuria or polydipsia.  Marland Kitchen   Physical Examination Today's Vitals   12/08/21 1523  BP: 110/70  Pulse: 71  SpO2: 98%  Weight: 165 lb 12.8 oz (75.2 kg)  Height: '5\' 9"'$  (1.753 m)   Body mass index is 24.48 kg/m.  Gen: resting comfortably, no acute distress HEENT: no scleral icterus, pupils equal round and reactive, no palptable cervical adenopathy,  CV: RRR, 2/6 systolic murmur rusb. Bilatearl carotid brutis Resp: Clear to auscultation bilaterally GI: abdomen is soft, non-tender, non-distended, normal bowel sounds, no hepatosplenomegaly MSK: extremities are warm, no edema.  Skin: warm, no rash Neuro:  no focal deficits Psych: appropriate affect   Diagnostic Studies  03/2017  echo Study Conclusions   - Left ventricle: The cavity size was normal. Wall thickness was   increased in a pattern of moderate LVH. Systolic function was   moderately reduced. The estimated ejection fraction was in the   range of 35% to 40%. Diffuse hypokinesis. Doppler parameters are   consistent with abnormal left ventricular relaxation (grade 1   diastolic dysfunction). Doppler parameters are consistent with   high ventricular filling pressure. - Aortic valve: Severely calcified annulus. Severely thickened   leaflets. There was severe stenosis. Mean gradient (S): 51 mm Hg.   VTI ratio of LVOT to aortic valve: 0.11. Valve area (VTI): 0.39   cm^2. Valve area (Vmax): 0.4 cm^2. Valve area (Vmean): 0.43 cm^2. - Left atrium: The atrium was mildly dilated. - Technically adequate study.   03/2017 Heart cath Prox RCA lesion is 10% stenosed. Ost 2nd Mrg to 2nd Mrg lesion is 10% stenosed.  Prox LAD to Mid LAD lesion is 20% stenosed.   1. Mild non-obstructive CAD 2. Severe aortic stenosis by echo (unable to cross the aortic valve from the radial approach).   Recommendations: He will need referral to CT surgery to discuss surgical AVR. He is low risk for surgery and will not be a candidate for TAVR if his surgical risk is felt to be low.      Assessment and Plan   1. Aortic stenosis/bicuspid aortic valve - s/p surigcal AVR Doing well without symptoms, continue to monitor   2. HTN -at goal, continue current meds   3. Carotid stenosis - referred last year to vascular, they were not able to get in touch with him after multiple attempts - discuss with patietn very important he see vascular, we have placed referal again and given him their number   4. Hyperlipidemia - request pcp labs, continue current meds       Arnoldo Lenis, M.D.

## 2021-12-08 NOTE — Patient Instructions (Signed)
Medication Instructions:  Continue all current medications.  Labwork: none  Testing/Procedures: none  Follow-Up: Your physician wants you to follow up in:  1 year.  You should receive a call from the office when due.  If you don't receive this call, please call our office to schedule the follow up appointment.    Any Other Special Instructions Will Be Listed Below (If Applicable). You have been referred to:  [facility]   If you need a refill on your cardiac medications before your next appointment, please call your pharmacy.

## 2021-12-31 ENCOUNTER — Other Ambulatory Visit: Payer: Self-pay | Admitting: *Deleted

## 2021-12-31 DIAGNOSIS — R0989 Other specified symptoms and signs involving the circulatory and respiratory systems: Secondary | ICD-10-CM

## 2022-01-12 ENCOUNTER — Ambulatory Visit (INDEPENDENT_AMBULATORY_CARE_PROVIDER_SITE_OTHER): Payer: Medicare Other

## 2022-01-12 ENCOUNTER — Ambulatory Visit (INDEPENDENT_AMBULATORY_CARE_PROVIDER_SITE_OTHER): Payer: Medicare Other | Admitting: Vascular Surgery

## 2022-01-12 ENCOUNTER — Other Ambulatory Visit: Payer: Self-pay

## 2022-01-12 ENCOUNTER — Encounter: Payer: Self-pay | Admitting: Vascular Surgery

## 2022-01-12 VITALS — BP 144/73 | HR 58 | Temp 98.5°F | Wt 164.0 lb

## 2022-01-12 DIAGNOSIS — I6521 Occlusion and stenosis of right carotid artery: Secondary | ICD-10-CM

## 2022-01-12 DIAGNOSIS — R0989 Other specified symptoms and signs involving the circulatory and respiratory systems: Secondary | ICD-10-CM

## 2022-01-12 NOTE — Progress Notes (Signed)
[facility]ist of [place]  Patient name: Jeremy Larson MRN: [id number] DOB: 1950/10/02 Sex: male  REASON FOR CONSULT: Evaluation critical right carotid stenosis  HPI: Jeremy Larson is a 71 y.o. male, who is today for evaluation of critical right carotid stenosis.  He is a very pleasant 71 year old gentleman.  He had known moderate carotid disease from duplex around the time of his surgical aortic valve replacement in 2019.  He had not had recent carotid duplex follow-up.  He did undergo carotid duplex at [facility] on 12/07/2021 revealed critical string sign in his right internal carotid artery.  He is seen today for further discussion.  He is right-handed.  He has no history of amaurosis fugax, aphasia, TIA or stroke.  He does have a history of some swallowing difficulty and this was treated with esophageal dilatation.  Recent cardiac work-up shows no evidence of congestive heart failure with good functioning of his valve.  Past Medical History:  Diagnosis Date   Aortic stenosis    a. s/p AVR on 05/18/2017 with 23 mm Edwards Magna-Ease Pericardial valve   Chronic systolic (congestive) heart failure ([place])    a. 05/2017: TEE showing EF 40-45%   Dyspnea     Family History  Problem Relation Age of Onset   Lung cancer Other        brother and father   Heart murmur Other        mother   Cancer Mother    Heart failure Mother    Cancer Father    Cancer Brother    Cirrhosis Brother    Alcohol abuse Brother     SOCIAL HISTORY: Social History   Socioeconomic History   Marital status: Significant Other    Spouse name: Not on file   Number of children: Not on file   Years of education: Not on file   Highest education level: Not on file  Occupational History   Not on file  Tobacco Use   Smoking status: Former    Packs/day: 1.00    Years: 10.00    Total pack years: 10.00    Types: Cigarettes    Passive exposure: Past    Smokeless tobacco: Current    Types: Snuff  Vaping Use   Vaping Use: Never used  Substance and Sexual Activity   Alcohol use: No   Drug use: No   Sexual activity: Not on file  Other Topics Concern   Not on file  Social History Narrative   Not on file   Social Determinants of Health   Financial Resource Strain: Not on file  Food Insecurity: Not on file  Transportation Needs: Not on file  Physical Activity: Not on file  Stress: Not on file  Social Connections: Not on file  Intimate Partner Violence: Not on file    No Known Allergies  Current Outpatient Medications  Medication Sig Dispense Refill   aspirin EC 81 MG tablet Take 81 mg by mouth daily.     cetirizine (ZYRTEC) 10 MG tablet Take 1 tablet by mouth as needed for allergies.     lisinopril (ZESTRIL) 5 MG tablet TAKE ONE TABLET BY MOUTH ONCE DAILY 90 tablet 3   metoprolol succinate (TOPROL-XL) 25 MG 24 hr tablet TAKE ONE TABLET BY MOUTH DAILY 30 tablet 0   Multiple Vitamin (MULTIVITAMIN) tablet Take 1 tablet by mouth daily.     pantoprazole (PROTONIX) 40 MG tablet Take 40 mg by mouth daily.  rosuvastatin (CRESTOR) 5 MG tablet Take 5 mg by mouth daily.     No current facility-administered medications for this visit.    REVIEW OF SYSTEMS:  '[X]'$  denotes positive finding, '[ ]'$  denotes negative finding Cardiac  Comments:  Chest pain or chest pressure:    Shortness of breath upon exertion:    Short of breath when lying flat:    Irregular heart rhythm:        Vascular    Pain in calf, thigh, or hip brought on by ambulation:    Pain in feet at night that wakes you up from your sleep:     Blood clot in your veins:    Leg swelling:         Pulmonary    Oxygen at home:    Productive cough:     Wheezing:         Neurologic    Sudden weakness in arms or legs:     Sudden numbness in arms or legs:     Sudden onset of difficulty speaking or slurred speech:    Temporary loss of vision in one eye:     Problems with  dizziness:         Gastrointestinal    Blood in stool:     Vomited blood:         Genitourinary    Burning when urinating:     Blood in urine:        Psychiatric    Major depression:         Hematologic    Bleeding problems:    Problems with blood clotting too easily:        Skin    Rashes or ulcers:        Constitutional    Fever or chills:      PHYSICAL EXAM: Vitals:   01/12/22 0846  BP: (!) 144/73  Pulse: (!) 58  Temp: 98.5 F (36.9 C)  TempSrc: Temporal  SpO2: 98%  Weight: 164 lb (74.4 kg)    GENERAL: The patient is a well-nourished male, in no acute distress. The vital signs are documented above. CARDIOVASCULAR: Right carotid bruit.  No left carotid bruit.  2+ radial pulses bilaterally. PULMONARY: There is good air exchange  MUSCULOSKELETAL: There are no major deformities or cyanosis. NEUROLOGIC: No focal weakness or paresthesias are detected. SKIN: There are no ulcers or rashes noted. PSYCHIATRIC: The patient has a normal affect.  DATA:  Reviewed his duplex from [facility].  He had repeat duplex in our office today to determine if he was a candidate for endarterectomy based on duplex alone.  This does confirm critical stenosis in his right internal carotid artery at the bifurcation.  He has bifurcation in his mid neck and has normal carotid above the bifurcation.  MEDICAL ISSUES: I discussed these findings at length with the patient.  I explained that he does have a critical asymptomatic internal carotid artery stenosis.  I explained that this puts him in approximately 5 %/year risk of neurologic event related to the high-grade asymptomatic stenosis.  I recommend right carotid endarterectomy for reduction of stroke risk.  I explained the surgical procedure in detail including the potential risk of stroke at 1 to 1-1/2% and also risk for cranial nerve injury at a lower rate than this.  Explained the expected 1 night hospitalization and that surgery would  be done at [facility].  I discussed his case with Dr. Annamarie Major who will  be doing the surgery.  We will schedule this at his earliest [place]. Sumayah Bearse, MD [facility] Specialists of [facility] Tel [phone] Pager [phone]  Note: Portions of this report may have been transcribed using voice recognition software.  Every effort has been made to ensure accuracy; however, inadvertent computerized transcription errors may still be present.

## 2022-01-12 NOTE — H&P (View-Only) (Signed)
[facility] and Vein Specialist of [place]  Patient name: Jeremy Larson MRN: [id number] DOB: 04-Nov-1950 Sex: male  REASON FOR CONSULT: Evaluation critical right carotid stenosis  HPI: Jeremy Larson is a 71 y.o. male, who is today for evaluation of critical right carotid stenosis.  He is a very pleasant 71 year old gentleman.  He had known moderate carotid disease from duplex around the time of his surgical aortic valve replacement in 2019.  He had not had recent carotid duplex follow-up.  He did undergo carotid duplex at [facility] on 12/07/2021 revealed critical string sign in his right internal carotid artery.  He is seen today for further discussion.  He is right-handed.  He has no history of amaurosis fugax, aphasia, TIA or stroke.  He does have a history of some swallowing difficulty and this was treated with esophageal dilatation.  Recent cardiac work-up shows no evidence of congestive heart failure with good functioning of his valve.  Past Medical History:  Diagnosis Date   Aortic stenosis    a. s/p AVR on 05/18/2017 with 23 mm Edwards Magna-Ease Pericardial valve   Chronic systolic (congestive) heart failure ([place])    a. 05/2017: TEE showing EF 40-45%   Dyspnea     Family History  Problem Relation Age of Onset   Lung cancer Other        brother and father   Heart murmur Other        mother   Cancer Mother    Heart failure Mother    Cancer Father    Cancer Brother    Cirrhosis Brother    Alcohol abuse Brother     SOCIAL HISTORY: Social History   Socioeconomic History   Marital status: Significant Other    Spouse name: Not on file   Number of children: Not on file   Years of education: Not on file   Highest education level: Not on file  Occupational History   Not on file  Tobacco Use   Smoking status: Former    Packs/day: 1.00    Years: 10.00    Total pack years: 10.00    Types: Cigarettes    Passive exposure: Past    Smokeless tobacco: Current    Types: Snuff  Vaping Use   Vaping Use: Never used  Substance and Sexual Activity   Alcohol use: No   Drug use: No   Sexual activity: Not on file  Other Topics Concern   Not on file  Social History Narrative   Not on file   Social Determinants of Health   Financial Resource Strain: Not on file  Food Insecurity: Not on file  Transportation Needs: Not on file  Physical Activity: Not on file  Stress: Not on file  Social Connections: Not on file  Intimate Partner Violence: Not on file    No Known Allergies  Current Outpatient Medications  Medication Sig Dispense Refill   aspirin EC 81 MG tablet Take 81 mg by mouth daily.     cetirizine (ZYRTEC) 10 MG tablet Take 1 tablet by mouth as needed for allergies.     lisinopril (ZESTRIL) 5 MG tablet TAKE ONE TABLET BY MOUTH ONCE DAILY 90 tablet 3   metoprolol succinate (TOPROL-XL) 25 MG 24 hr tablet TAKE ONE TABLET BY MOUTH DAILY 30 tablet 0   Multiple Vitamin (MULTIVITAMIN) tablet Take 1 tablet by mouth daily.     pantoprazole (PROTONIX) 40 MG tablet Take 40 mg by mouth daily.  rosuvastatin (CRESTOR) 5 MG tablet Take 5 mg by mouth daily.     No current facility-administered medications for this visit.    REVIEW OF SYSTEMS:  '[X]'$  denotes positive finding, '[ ]'$  denotes negative finding Cardiac  Comments:  Chest pain or chest pressure:    Shortness of breath upon exertion:    Short of breath when lying flat:    Irregular heart rhythm:        Vascular    Pain in calf, thigh, or hip brought on by ambulation:    Pain in feet at night that wakes you up from your sleep:     Blood clot in your veins:    Leg swelling:         Pulmonary    Oxygen at home:    Productive cough:     Wheezing:         Neurologic    Sudden weakness in arms or legs:     Sudden numbness in arms or legs:     Sudden onset of difficulty speaking or slurred speech:    Temporary loss of vision in one eye:     Problems with  dizziness:         Gastrointestinal    Blood in stool:     Vomited blood:         Genitourinary    Burning when urinating:     Blood in urine:        Psychiatric    Major depression:         Hematologic    Bleeding problems:    Problems with blood clotting too easily:        Skin    Rashes or ulcers:        Constitutional    Fever or chills:      PHYSICAL EXAM: Vitals:   01/12/22 0846  BP: (!) 144/73  Pulse: (!) 58  Temp: 98.5 F (36.9 C)  TempSrc: Temporal  SpO2: 98%  Weight: 164 lb (74.4 kg)    GENERAL: The patient is a well-nourished male, in no acute distress. The vital signs are documented above. CARDIOVASCULAR: Right carotid bruit.  No left carotid bruit.  2+ radial pulses bilaterally. PULMONARY: There is good air exchange  MUSCULOSKELETAL: There are no major deformities or cyanosis. NEUROLOGIC: No focal weakness or paresthesias are detected. SKIN: There are no ulcers or rashes noted. PSYCHIATRIC: The patient has a normal affect.  DATA:  Reviewed his duplex from [facility].  He had repeat duplex in our office today to determine if he was a candidate for endarterectomy based on duplex alone.  This does confirm critical stenosis in his right internal carotid artery at the bifurcation.  He has bifurcation in his mid neck and has normal carotid above the bifurcation.  MEDICAL ISSUES: I discussed these findings at length with the patient.  I explained that he does have a critical asymptomatic internal carotid artery stenosis.  I explained that this puts him in approximately 5 %/year risk of neurologic event related to the high-grade asymptomatic stenosis.  I recommend right carotid endarterectomy for reduction of stroke risk.  I explained the surgical procedure in detail including the potential risk of stroke at 1 to 1-1/2% and also risk for cranial nerve injury at a lower rate than this.  Explained the expected 1 night hospitalization and that surgery would  be done at [facility].  I discussed his case with Dr. Annamarie Major who will  be doing the surgery.  We will schedule this at his earliest [place]. Shirlean Berman, MD [facility] Vascular and Vein Specialists of [facility] Tel [phone] Pager [phone]  Note: Portions of this report may have been transcribed using voice recognition software.  Every effort has been made to ensure accuracy; however, inadvertent computerized transcription errors may still be present.

## 2022-01-13 ENCOUNTER — Encounter (HOSPITAL_COMMUNITY): Payer: Self-pay

## 2022-01-13 NOTE — Progress Notes (Signed)
Surgical Instructions    Your procedure is scheduled on January 19, 2022.  Report to [facility] Main Entrance "A" at 8:50 A.M., then check in with the Admitting office.  Call this number if you have problems the morning of surgery:  [phone]   If you have any questions prior to your surgery date call [phone]: Open Monday-Friday 8am-4pm   Remember:  Do not eat or drink after midnight the night before your surgery.   Take these medicines the morning of surgery with A SIP OF WATER:   Aspirin Cetirizine (Zyrtec) Metoprolol Succinate (Toprol-XL) Pantoprazole  As of today, STOP taking any Aleve, Naproxen, Ibuprofen, Motrin, Advil, Goody's, BC's, all herbal medications, fish oil, and all vitamins.          Do not wear jewelry. Do not wear lotions, powders, cologne or deodorant. Do not shave 48 hours prior to surgery.  Men may shave face and neck. Do not bring valuables to the hospital.   [facility] is not responsible for any belongings or valuables.    Do NOT Smoke (Tobacco/Vaping)  24 hours prior to your procedure  If you use a CPAP at night, you may bring your mask for your overnight stay.   Contacts, glasses, hearing aids, dentures or partials may not be worn into surgery, please bring cases for these belongings   For patients admitted to the hospital, discharge time will be determined by your treatment team.   Patients discharged the day of surgery will not be allowed to drive home, and someone needs to stay with them for 24 hours.   SURGICAL WAITING ROOM VISITATION Patients having surgery or a procedure may have no more than 2 support people in the waiting area - these visitors may rotate.   Children under the age of 33 must have an adult with them who is not the patient. If the patient needs to stay at the hospital during part of their recovery, the visitor guidelines for inpatient rooms apply. Pre-op nurse will coordinate an appropriate time for 1 support  person to accompany patient in pre-op.  This support person may not rotate.   Please refer to the [facility] website for the visitor guidelines for Inpatients (after your surgery is over and you are in a regular room).    Special instructions:    Oral Hygiene is also important to reduce your risk of infection.  Remember - BRUSH YOUR TEETH THE MORNING OF SURGERY WITH YOUR REGULAR TOOTHPASTE   [facility]- Preparing For Surgery  Before surgery, you can play an important role. Because skin is not sterile, your skin needs to be as free of germs as possible. You can reduce the number of germs on your skin by washing with CHG (chlorahexidine gluconate) Soap before surgery.  CHG is an antiseptic cleaner which kills germs and bonds with the skin to continue killing germs even after washing.     Please do not use if you have an allergy to CHG or antibacterial soaps. If your skin becomes reddened/irritated stop using the CHG.  Do not shave (including legs and underarms) for at least 48 hours prior to first CHG shower. It is OK to shave your face.  Please follow these instructions carefully.     Shower the NIGHT BEFORE SURGERY and the MORNING OF SURGERY with CHG Soap.   If you chose to wash your hair, wash your hair first as usual with your normal shampoo. After you shampoo, rinse your hair and body thoroughly to  remove the shampoo.  Then [employer] and genitals (private parts) with your normal soap and rinse thoroughly to remove soap.  After that Use CHG Soap as you would any other liquid soap. You can apply CHG directly to the skin and wash gently with a scrungie or a clean washcloth.   Apply the CHG Soap to your body ONLY FROM THE NECK DOWN.  Do not use on open wounds or open sores. Avoid contact with your eyes, ears, mouth and genitals (private parts). Wash Face and genitals (private parts)  with your normal soap.   Wash thoroughly, paying special attention to the area where your surgery will be  performed.  Thoroughly rinse your body with warm water from the neck down.  DO NOT shower/wash with your normal soap after using and rinsing off the CHG Soap.  Pat yourself dry with a CLEAN TOWEL.  Wear CLEAN PAJAMAS to bed the night before surgery  Place CLEAN SHEETS on your bed the night before your surgery  DO NOT SLEEP WITH PETS.   Day of Surgery:  Take a shower with CHG soap. Wear Clean/Comfortable clothing the morning of surgery Do not apply any deodorants/lotions.   Remember to brush your teeth WITH YOUR REGULAR TOOTHPASTE.    If you received a COVID test during your pre-op visit, it is requested that you wear a mask when out in public, stay away from anyone that may not be feeling well, and notify your surgeon if you develop symptoms. If you have been in contact with anyone that has tested positive in the last 10 days, please notify your surgeon.    Please read over the following fact sheets that you were given.

## 2022-01-14 ENCOUNTER — Encounter (HOSPITAL_COMMUNITY): Payer: Self-pay

## 2022-01-14 ENCOUNTER — Other Ambulatory Visit: Payer: Self-pay

## 2022-01-14 ENCOUNTER — Encounter (HOSPITAL_COMMUNITY)
Admission: RE | Admit: 2022-01-14 | Discharge: 2022-01-14 | Disposition: A | Discharge status: Home / Self Care | Payer: Medicare Other | Source: Ambulatory Visit | Attending: Surgery | Admitting: Surgery

## 2022-01-14 VITALS — BP 128/99 | HR 77 | Temp 98.2°F | Resp 17 | Ht 69.0 in | Wt 165.1 lb

## 2022-01-14 DIAGNOSIS — Z9889 Other specified postprocedural states: Secondary | ICD-10-CM | POA: Diagnosis not present

## 2022-01-14 DIAGNOSIS — I2510 Atherosclerotic heart disease of native coronary artery without angina pectoris: Secondary | ICD-10-CM | POA: Insufficient documentation

## 2022-01-14 DIAGNOSIS — I6529 Occlusion and stenosis of unspecified carotid artery: Secondary | ICD-10-CM | POA: Diagnosis not present

## 2022-01-14 DIAGNOSIS — Z01812 Encounter for preprocedural laboratory examination: Secondary | ICD-10-CM | POA: Diagnosis not present

## 2022-01-14 DIAGNOSIS — I6521 Occlusion and stenosis of right carotid artery: Secondary | ICD-10-CM | POA: Diagnosis not present

## 2022-01-14 DIAGNOSIS — K219 Gastro-esophageal reflux disease without esophagitis: Secondary | ICD-10-CM | POA: Insufficient documentation

## 2022-01-14 DIAGNOSIS — N1831 Chronic kidney disease, stage 3a: Secondary | ICD-10-CM | POA: Insufficient documentation

## 2022-01-14 DIAGNOSIS — I350 Nonrheumatic aortic (valve) stenosis: Secondary | ICD-10-CM | POA: Insufficient documentation

## 2022-01-14 DIAGNOSIS — I1310 Hypertensive heart and chronic kidney disease without heart failure, with stage 1 through stage 4 chronic kidney disease, or unspecified chronic kidney disease: Secondary | ICD-10-CM | POA: Diagnosis not present

## 2022-01-14 DIAGNOSIS — I5022 Chronic systolic (congestive) heart failure: Secondary | ICD-10-CM | POA: Diagnosis not present

## 2022-01-14 DIAGNOSIS — Z01818 Encounter for other preprocedural examination: Secondary | ICD-10-CM

## 2022-01-14 DIAGNOSIS — Z87891 Personal history of nicotine dependence: Secondary | ICD-10-CM | POA: Insufficient documentation

## 2022-01-14 HISTORY — DX: Benign lipomatous neoplasm of skin and subcutaneous tissue of right arm: D17.21

## 2022-01-14 HISTORY — DX: Gastro-esophageal reflux disease without esophagitis: K21.9

## 2022-01-14 LAB — URINALYSIS, ROUTINE W REFLEX MICROSCOPIC
Bilirubin Urine: NEGATIVE
Glucose, UA: NEGATIVE mg/dL
Hgb urine dipstick: NEGATIVE
Ketones, ur: NEGATIVE mg/dL
Leukocytes,Ua: NEGATIVE
Nitrite: NEGATIVE
Protein, ur: NEGATIVE mg/dL
Specific Gravity, Urine: 1.018 (ref 1.005–1.030)
pH: 5 (ref 5.0–8.0)

## 2022-01-14 LAB — COMPREHENSIVE METABOLIC PANEL
ALT: 24 U/L (ref 0–44)
AST: 23 U/L (ref 15–41)
Albumin: 4 g/dL (ref 3.5–5.0)
Alkaline Phosphatase: 71 U/L (ref 38–126)
Anion gap: 6 (ref 5–15)
BUN: 17 mg/dL (ref 8–23)
CO2: 29 mmol/L (ref 22–32)
Calcium: 9.5 mg/dL (ref 8.9–10.3)
Chloride: 104 mmol/L (ref 98–111)
Creatinine, Ser: 1.65 mg/dL — ABNORMAL HIGH (ref 0.61–1.24)
GFR, Estimated: 45 mL/min — ABNORMAL LOW (ref >60)
Glucose, Bld: 101 mg/dL — ABNORMAL HIGH (ref 70–99)
Potassium: 5 mmol/L (ref 3.5–5.1)
Sodium: 139 mmol/L (ref 135–145)
Total Bilirubin: 0.6 mg/dL (ref 0.3–1.2)
Total Protein: 6.6 g/dL (ref 6.5–8.1)

## 2022-01-14 LAB — PROTIME-INR
INR: 0.9 (ref 0.8–1.2)
Prothrombin Time: 12.5 seconds (ref 11.4–15.2)

## 2022-01-14 LAB — CBC
HCT: 44.2 % (ref 39.0–52.0)
Hemoglobin: 14.7 g/dL (ref 13.0–17.0)
MCH: 32.7 pg (ref 26.0–34.0)
MCHC: 33.3 g/dL (ref 30.0–36.0)
MCV: 98.4 fL (ref 80.0–100.0)
Platelets: 237 10*3/uL (ref 150–400)
RBC: 4.49 MIL/uL (ref 4.22–5.81)
RDW: 12.3 % (ref 11.5–15.5)
WBC: 7 10*3/uL (ref 4.0–10.5)
nRBC: 0 % (ref 0.0–0.2)

## 2022-01-14 LAB — TYPE AND SCREEN
ABO/RH(D): O NEG
Antibody Screen: NEGATIVE

## 2022-01-14 LAB — APTT: aPTT: 26 seconds (ref 24–36)

## 2022-01-14 LAB — SURGICAL PCR SCREEN
MRSA, PCR: NEGATIVE
Staphylococcus aureus: NEGATIVE

## 2022-01-14 NOTE — Progress Notes (Signed)
PCP: Dr. Gar Ponto Cardiologist: Dr. Harl Bowie  EKG:12-08-21 CXR: ECHO: 08-11-2017 Stress Test: denies Cardiac Cath: 05-12-2017  ERAS: NPO  Pt reports he is functionally able to complete activities since aortic valve replacement with Dr. Cyndia Bent.  Patient denies shortness of breath, fever, cough, and chest pain at PAT appointment.  Patient verbalized understanding of instructions provided today at the PAT appointment.  Patient asked to review instructions at home and day of surgery.

## 2022-01-18 NOTE — Progress Notes (Signed)
Anesthesia Chart Review:  Case: [id number] Date/Time: 01/19/22 1038   Procedure: RIGHT ENDARTERECTOMY CAROTID (Right)   Anesthesia type: General   Pre-op diagnosis: CAROTID ARTERY STENOSIS   Location: [facility] OR ROOM 89 / [place] OR   Surgeons: Serafina Mitchell, MD       DISCUSSION: Patient is a 71 year old male scheduled for the above procedure.  History includes former smoker, HTN, aortic stenosis (s/p AVR using 23 mm Edwards Magna-Ease pericardial tissue valve 11/20/44), chronic systolic CHF, carotid artery stenosis (12/2021 96-29% RICA, 5-28% LICA), GERD. 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 Labs since January 2023 suggest underlying CKD, ~ stage 3A.  Last cardiology visit with Dr. Carlyle Dolly was on 12/08/21. Doing well from a cardiac standpoint. Advised that he should follow-up with vascular surgery to re-evaluate carotid artery stenosis, as [place] in July 2022 showed large amount of right ICA plaque with sonographic string sign. He was seen by vascular surgeon Dr. Sherren Mocha Early on 01/12/22 with US showing 80-99% RICA stenosis and 4-13% of the LICA. Right carotid endarterectomy recommended.   Creatinine on 01/14/22 was elevated at 1.65, BUN 17, eGFR 45, K 5.0. Labs in [place] are from 2019, as his PCP is not in [place]. There are scanned labs under the Media tab from 05/24/21 that show a Creatinine then of 1.31, BUN 12, eGRF 59. Will attempt to get records and labs from PCP office, but labs since 05/2021 suggest some underlying CKD (~ stage 3A), so would anticipate continued out-patient follow-up.   He has critical asymptomatic RICA stenosis with pending right CEA. Will plan to update note if additional pertinent records received prior to surgery, otherwise anesthesia team to evaluate on the day of surgery.     VS: BP (!) 128/99   Pulse 77   Temp 36.8 C (Oral)   Resp 17   Ht 5' 9"  (1.753 m)   Wt 74.9 kg   SpO2 100%   BMI 24.38 kg/m   PROVIDERS: Caryl Bis, MD is PCP  Carlyle Dolly, MD is  cardiologist   LABS: Preoperative labs noted. See DISCUSSION. (all labs ordered are listed, but only abnormal results are displayed)  Labs Reviewed  COMPREHENSIVE METABOLIC PANEL - Abnormal; Notable for the following components:      Result Value   Glucose, Bld 101 (*)    Creatinine, Ser 1.65 (*)    GFR, Estimated 45 (*)    All other components within normal limits  URINALYSIS, ROUTINE W REFLEX MICROSCOPIC - Abnormal; Notable for the following components:   APPearance HAZY (*)    All other components within normal limits  SURGICAL PCR SCREEN  CBC  PROTIME-INR  APTT  TYPE AND SCREEN     EKG: 12/08/21: NSR   CV: US Carotid 01/12/22: Summary:  - Right Carotid: Velocities in the right ICA are consistent with a 80-99%                 stenosis. The ECA appears >50% stenosed. Stenosis in the                 bulb/bifurcation.  - Left Carotid: Velocities in the left ICA are consistent with a 1-39%  stenosis.  - Vertebrals:  Bilateral vertebral arteries demonstrate antegrade flow.  Subclavians: Normal flow hemodynamics were seen in bilateral subclavian               arteries.    Echo 08/11/17: Study Conclusions  - Left ventricle: The cavity size was normal. Wall thickness  was    increased in a pattern of moderate LVH. Systolic function was    normal. The estimated ejection fraction was in the range of 60%    to 65%. Wall motion was normal; there were no regional wall    motion abnormalities. Doppler parameters are consistent with    abnormal left ventricular relaxation (grade 1 diastolic    dysfunction).  - Aortic valve:  There is a 23 mm Edwards Magna-ease pericardial    valve in the AV position.  Doppler:   There was no stenosis.    There was no significant regurgitation.Mean gradient (S): 8 mm Hg.  - Mitral valve: Mildly calcified annulus. Normal thickness leaflets.  - Technically adequate study.    Cardiac cath 04/12/17 (PRE-AVR): Prox RCA lesion is 10% stenosed. Ost  2nd Mrg to 2nd Mrg lesion is 10% stenosed. Prox LAD to Mid LAD lesion is 20% stenosed.   1. Mild non-obstructive CAD 2. Severe aortic stenosis by echo (unable to cross the aortic valve from the radial approach).    Recommendations: He will need referral to CT surgery to discuss surgical AVR. He is low risk for surgery and will not be a candidate for TAVR if his surgical risk is felt to be low.    Past Medical History:  Diagnosis Date   Aortic stenosis    a. 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 05/2017: TEE showing EF 40-45%   Dyspnea    GERD (gastroesophageal reflux disease)    Hypertension     Past Surgical History:  Procedure Laterality Date   AORTIC VALVE REPLACEMENT N/A 05/18/2017   Procedure: AORTIC VALVE REPLACEMENT (AVR),TEE;  Surgeon: Gaye Pollack, MD;  Location: [place];  Service: Open Heart Surgery;  Laterality: N/A;   BIOPSY  08/27/2021   Procedure: BIOPSY;  Surgeon: Harvel Quale, MD;  [facility];  Service: Gastroenterology;;   COLONOSCOPY WITH PROPOFOL N/A 09/08/2020   Procedure: COLONOSCOPY WITH PROPOFOL;  Surgeon: Harvel Quale, MD;  [facility];  Service: Gastroenterology;  Laterality: N/A;  11:15 AM   ESOPHAGEAL DILATION N/A 08/27/2021   Procedure: ESOPHAGEAL DILATION;  Surgeon: Harvel Quale, MD;  [facility];  Service: Gastroenterology;  Laterality: N/A;   ESOPHAGOGASTRODUODENOSCOPY (EGD) WITH PROPOFOL N/A 08/27/2021   Procedure: ESOPHAGOGASTRODUODENOSCOPY (EGD) WITH PROPOFOL;  Surgeon: Harvel Quale, MD;  [facility];  Service: Gastroenterology;  Laterality: N/A;  1215   HAND SURGERY     left hang following trauma   RIGHT/LEFT HEART CATH AND CORONARY ANGIOGRAPHY N/A 04/12/2017   Procedure: RIGHT/LEFT HEART  CATH AND CORONARY ANGIOGRAPHY;  Surgeon: Burnell Blanks, MD;  Location: [facility];  Service: Cardiovascular;  Laterality: N/A;   TEE WITHOUT CARDIOVERSION N/A 05/18/2017   Procedure: TRANSESOPHAGEAL ECHOCARDIOGRAM (TEE);  Surgeon: Gaye Pollack, MD;  Location: [place];  Service: Open Heart Surgery;  Laterality: N/A;    MEDICATIONS:  aspirin EC 81 MG tablet   cetirizine (ZYRTEC) 10 MG tablet   lisinopril (ZESTRIL) 5 MG tablet   metoprolol succinate (TOPROL-XL) 25 MG 24 hr tablet   Multiple Vitamin (MULTIVITAMIN WITH MINERALS) TABS tablet   pantoprazole (PROTONIX) 40 MG tablet   rosuvastatin (CRESTOR) 5 MG tablet   No current facility-administered medications for this encounter.  Myra Gianotti, PA-C Surgical Short Stay/Anesthesiology [facility] Phone [phone] [facility] Phone [phone] 01/18/2022 12:02 PM

## 2022-01-18 NOTE — Anesthesia Preprocedure Evaluation (Signed)
Anesthesia Evaluation  Patient identified by MRN, date of birth, ID band Patient awake    Reviewed: Allergy & Precautions, NPO status , Patient's Chart, lab work & pertinent test results  Airway Mallampati: II  TM Distance: >3 FB Neck ROM: Full    Dental  (+) Edentulous Upper, Edentulous Lower   Pulmonary former smoker,    breath sounds clear to auscultation       Cardiovascular hypertension, Pt. on medications and Pt. on home beta blockers +CHF   Rhythm:Regular Rate:Normal     Neuro/Psych CVA, No Residual Symptoms    GI/Hepatic Neg liver ROS, GERD  Medicated,  Endo/Other  negative endocrine ROS  Renal/GU negative Renal ROS     Musculoskeletal negative musculoskeletal ROS (+)   Abdominal Normal abdominal exam  (+)   Peds  Hematology negative hematology ROS (+)   Anesthesia Other Findings   Reproductive/Obstetrics                           Anesthesia Physical Anesthesia Plan  ASA: 3  Anesthesia Plan: General   Post-op Pain Management:    Induction: Intravenous  PONV Risk Score and Plan: 3 and Ondansetron, Dexamethasone and Midazolam  Airway Management Planned: Oral ETT  Additional Equipment: Arterial line  Intra-op Plan:   Post-operative Plan: Extubation in OR  Informed Consent: I have reviewed the patients History and Physical, chart, labs and discussed the procedure including the risks, benefits and alternatives for the proposed anesthesia with the patient or authorized representative who has indicated his/her understanding and acceptance.     Dental advisory given  Plan Discussed with: CRNA  Anesthesia Plan Comments: (PAT note written 01/18/2022 by Myra Gianotti, PA-C.  Remi gtt )      Anesthesia Quick Evaluation

## 2022-01-19 ENCOUNTER — Inpatient Hospital Stay (HOSPITAL_COMMUNITY)
Admission: RE | Admit: 2022-01-19 | Discharge: 2022-01-20 | DRG: 038 | Disposition: A | Discharge status: Home / Self Care | Payer: Medicare Other | Attending: Surgery | Admitting: Surgery

## 2022-01-19 ENCOUNTER — Other Ambulatory Visit: Payer: Self-pay

## 2022-01-19 ENCOUNTER — Encounter (HOSPITAL_COMMUNITY)
Admission: RE | Disposition: A | Discharge status: Home / Self Care | Payer: Self-pay | Source: Home / Self Care | Attending: Surgery

## 2022-01-19 ENCOUNTER — Encounter (HOSPITAL_COMMUNITY): Payer: Self-pay | Admitting: Surgery

## 2022-01-19 ENCOUNTER — Inpatient Hospital Stay (HOSPITAL_COMMUNITY): Payer: Medicare Other | Admitting: Anesthesiology

## 2022-01-19 ENCOUNTER — Inpatient Hospital Stay (HOSPITAL_COMMUNITY): Payer: Medicare Other | Admitting: Vascular Surgery

## 2022-01-19 DIAGNOSIS — Z87891 Personal history of nicotine dependence: Secondary | ICD-10-CM

## 2022-01-19 DIAGNOSIS — I6521 Occlusion and stenosis of right carotid artery: Secondary | ICD-10-CM | POA: Diagnosis not present

## 2022-01-19 DIAGNOSIS — Z8249 Family history of ischemic heart disease and other diseases of the circulatory system: Secondary | ICD-10-CM | POA: Diagnosis not present

## 2022-01-19 DIAGNOSIS — I110 Hypertensive heart disease with heart failure: Secondary | ICD-10-CM

## 2022-01-19 DIAGNOSIS — Z79899 Other long term (current) drug therapy: Secondary | ICD-10-CM

## 2022-01-19 DIAGNOSIS — I509 Heart failure, unspecified: Secondary | ICD-10-CM | POA: Diagnosis not present

## 2022-01-19 DIAGNOSIS — Z7982 Long term (current) use of aspirin: Secondary | ICD-10-CM | POA: Diagnosis not present

## 2022-01-19 DIAGNOSIS — Z811 Family history of alcohol abuse and dependence: Secondary | ICD-10-CM

## 2022-01-19 DIAGNOSIS — I6529 Occlusion and stenosis of unspecified carotid artery: Secondary | ICD-10-CM | POA: Diagnosis present

## 2022-01-19 DIAGNOSIS — K219 Gastro-esophageal reflux disease without esophagitis: Secondary | ICD-10-CM | POA: Diagnosis not present

## 2022-01-19 DIAGNOSIS — Z801 Family history of malignant neoplasm of trachea, bronchus and lung: Secondary | ICD-10-CM | POA: Diagnosis not present

## 2022-01-19 DIAGNOSIS — I5022 Chronic systolic (congestive) heart failure: Secondary | ICD-10-CM | POA: Diagnosis present

## 2022-01-19 DIAGNOSIS — I63231 Cerebral infarction due to unspecified occlusion or stenosis of right carotid arteries: Secondary | ICD-10-CM

## 2022-01-19 DIAGNOSIS — Z952 Presence of prosthetic heart valve: Secondary | ICD-10-CM

## 2022-01-19 HISTORY — PX: ENDARTERECTOMY: SHX5162

## 2022-01-19 HISTORY — PX: PATCH ANGIOPLASTY: SHX6230

## 2022-01-19 LAB — POCT ACTIVATED CLOTTING TIME
Activated Clotting Time: 251 seconds
Activated Clotting Time: 263 seconds

## 2022-01-19 SURGERY — ENDARTERECTOMY, CAROTID
Anesthesia: General | Site: Neck | Laterality: Right

## 2022-01-19 MED ORDER — DEXAMETHASONE SODIUM PHOSPHATE 10 MG/ML IJ SOLN
INTRAMUSCULAR | Status: DC | PRN
  Administered 2022-01-19: 10 mg via INTRAVENOUS

## 2022-01-19 MED ORDER — FENTANYL CITRATE (PF) 250 MCG/5ML IJ SOLN
INTRAMUSCULAR | Status: AC
  Filled 2022-01-19: qty 5

## 2022-01-19 MED ORDER — SODIUM CHLORIDE 0.9% FLUSH: 3.0000 mL | INTRAVENOUS | Status: DC | PRN

## 2022-01-19 MED ORDER — HEMOSTATIC AGENTS (NO CHARGE) OPTIME
TOPICAL | Status: DC | PRN
  Administered 2022-01-19: 1 via TOPICAL

## 2022-01-19 MED ORDER — EPHEDRINE SULFATE-NACL 50-0.9 MG/10ML-% IV SOSY
PREFILLED_SYRINGE | INTRAVENOUS | Status: DC | PRN
  Administered 2022-01-19: 10 mg via INTRAVENOUS

## 2022-01-19 MED ORDER — HEPARIN 6000 UNIT IRRIGATION SOLUTION
Status: DC | PRN
  Administered 2022-01-19: 1

## 2022-01-19 MED ORDER — HYDRALAZINE HCL 20 MG/ML IJ SOLN: 5.0000 mg | INTRAMUSCULAR | Status: DC | PRN

## 2022-01-19 MED ORDER — CEFAZOLIN SODIUM-DEXTROSE 2-4 GM/100ML-% IV SOLN
2.0000 g | INTRAVENOUS | Status: AC
  Administered 2022-01-19: 2 g via INTRAVENOUS
  Filled 2022-01-19: qty 100

## 2022-01-19 MED ORDER — PROPOFOL 10 MG/ML IV BOLUS
INTRAVENOUS | Status: DC | PRN
  Administered 2022-01-19: 100 mg via INTRAVENOUS

## 2022-01-19 MED ORDER — BISACODYL 5 MG PO TBEC: 5.0000 mg | DELAYED_RELEASE_TABLET | Freq: Every day | ORAL | Status: DC | PRN

## 2022-01-19 MED ORDER — GUAIFENESIN-DM 100-10 MG/5ML PO SYRP: 15.0000 mL | ORAL_SOLUTION | ORAL | Status: DC | PRN

## 2022-01-19 MED ORDER — ACETAMINOPHEN 10 MG/ML IV SOLN: 1000.0000 mg | Freq: Once | INTRAVENOUS | Status: DC | PRN

## 2022-01-19 MED ORDER — ASPIRIN 81 MG PO TBEC
81.0000 mg | DELAYED_RELEASE_TABLET | Freq: Every morning | ORAL | Status: DC
  Administered 2022-01-20: 81 mg via ORAL
  Filled 2022-01-19: qty 1

## 2022-01-19 MED ORDER — METOPROLOL TARTRATE 5 MG/5ML IV SOLN: 2.0000 mg | INTRAVENOUS | Status: DC | PRN

## 2022-01-19 MED ORDER — PANTOPRAZOLE SODIUM 40 MG PO TBEC
40.0000 mg | DELAYED_RELEASE_TABLET | Freq: Every morning | ORAL | Status: DC
  Administered 2022-01-20: 40 mg via ORAL
  Filled 2022-01-19: qty 1

## 2022-01-19 MED ORDER — ROSUVASTATIN CALCIUM 5 MG PO TABS
5.0000 mg | ORAL_TABLET | Freq: Every day | ORAL | Status: DC
  Administered 2022-01-19: 5 mg via ORAL
  Filled 2022-01-19: qty 1

## 2022-01-19 MED ORDER — CEFAZOLIN SODIUM-DEXTROSE 2-4 GM/100ML-% IV SOLN
2.0000 g | Freq: Three times a day (TID) | INTRAVENOUS | Status: AC
  Administered 2022-01-19 (×2): 2 g via INTRAVENOUS
  Filled 2022-01-19 (×2): qty 100

## 2022-01-19 MED ORDER — HEPARIN 6000 UNIT IRRIGATION SOLUTION
Status: AC
Start: 2022-01-19 — End: ?
  Filled 2022-01-19: qty 500

## 2022-01-19 MED ORDER — ACETAMINOPHEN 325 MG PO TABS: 325.0000 mg | ORAL_TABLET | Freq: Once | ORAL | Status: DC | PRN

## 2022-01-19 MED ORDER — LABETALOL HCL 5 MG/ML IV SOLN: 10.0000 mg | INTRAVENOUS | Status: DC | PRN

## 2022-01-19 MED ORDER — SODIUM CHLORIDE 0.9 % IV SOLN
0.1500 ug/kg/min | INTRAVENOUS | Status: AC
  Administered 2022-01-19: .1 ug/kg/min via INTRAVENOUS
  Filled 2022-01-19: qty 2000

## 2022-01-19 MED ORDER — ACETAMINOPHEN 160 MG/5ML PO SOLN: 325.0000 mg | Freq: Once | ORAL | Status: DC | PRN

## 2022-01-19 MED ORDER — LACTATED RINGERS IV SOLN: INTRAVENOUS | Status: DC

## 2022-01-19 MED ORDER — HEPARIN SODIUM (PORCINE) 5000 UNIT/ML IJ SOLN
5000.0000 [IU] | Freq: Three times a day (TID) | INTRAMUSCULAR | Status: DC
  Administered 2022-01-20: 5000 [IU] via SUBCUTANEOUS
  Filled 2022-01-19: qty 1

## 2022-01-19 MED ORDER — SUGAMMADEX SODIUM 200 MG/2ML IV SOLN
INTRAVENOUS | Status: DC | PRN
  Administered 2022-01-19: 152.4 mg via INTRAVENOUS

## 2022-01-19 MED ORDER — PHENYLEPHRINE 80 MCG/ML (10ML) SYRINGE FOR IV PUSH (FOR BLOOD PRESSURE SUPPORT)
PREFILLED_SYRINGE | INTRAVENOUS | Status: AC
Start: 2022-01-19 — End: ?
  Filled 2022-01-19: qty 10

## 2022-01-19 MED ORDER — TRAMADOL HCL 50 MG PO TABS: 50.0000 mg | ORAL_TABLET | Freq: Four times a day (QID) | ORAL | Status: DC | PRN

## 2022-01-19 MED ORDER — MEPERIDINE HCL 25 MG/ML IJ SOLN: 6.2500 mg | INTRAMUSCULAR | Status: DC | PRN

## 2022-01-19 MED ORDER — SODIUM CHLORIDE 0.9% FLUSH
3.0000 mL | Freq: Two times a day (BID) | INTRAVENOUS | Status: DC
  Administered 2022-01-19: 3 mL via INTRAVENOUS

## 2022-01-19 MED ORDER — EPHEDRINE 5 MG/ML INJ
INTRAVENOUS | Status: AC
Start: 2022-01-19 — End: ?
  Filled 2022-01-19: qty 10

## 2022-01-19 MED ORDER — LIDOCAINE 2% (20 MG/ML) 5 ML SYRINGE: INTRAMUSCULAR | Status: DC | PRN

## 2022-01-19 MED ORDER — PROTAMINE SULFATE 10 MG/ML IV SOLN
INTRAVENOUS | Status: AC
  Filled 2022-01-19: qty 10

## 2022-01-19 MED ORDER — LIDOCAINE HCL 1 % IJ SOLN
INTRAMUSCULAR | Status: AC
Start: 2022-01-19 — End: ?
  Filled 2022-01-19: qty 20

## 2022-01-19 MED ORDER — 0.9 % SODIUM CHLORIDE (POUR BTL) OPTIME
TOPICAL | Status: DC | PRN
  Administered 2022-01-19: 1000 mL

## 2022-01-19 MED ORDER — CHLORHEXIDINE GLUCONATE CLOTH 2 % EX PADS: 6.0000 | MEDICATED_PAD | Freq: Once | CUTANEOUS | Status: DC

## 2022-01-19 MED ORDER — METOPROLOL SUCCINATE ER 25 MG PO TB24
25.0000 mg | ORAL_TABLET | Freq: Every day | ORAL | Status: DC
  Administered 2022-01-20: 25 mg via ORAL
  Filled 2022-01-19: qty 1

## 2022-01-19 MED ORDER — SODIUM CHLORIDE 0.9 % IV SOLN: 250.0000 mL | INTRAVENOUS | Status: DC | PRN

## 2022-01-19 MED ORDER — ROCURONIUM BROMIDE 10 MG/ML (PF) SYRINGE
PREFILLED_SYRINGE | INTRAVENOUS | Status: DC | PRN
  Administered 2022-01-19: 60 mg via INTRAVENOUS

## 2022-01-19 MED ORDER — HEPARIN SODIUM (PORCINE) 1000 UNIT/ML IJ SOLN
INTRAMUSCULAR | Status: DC | PRN
  Administered 2022-01-19: 8000 [IU] via INTRAVENOUS

## 2022-01-19 MED ORDER — ALUM & MAG HYDROXIDE-SIMETH 200-200-20 MG/5ML PO SUSP: 15.0000 mL | ORAL | Status: DC | PRN

## 2022-01-19 MED ORDER — ACETAMINOPHEN 325 MG PO TABS
325.0000 mg | ORAL_TABLET | ORAL | Status: DC | PRN
  Administered 2022-01-19: 650 mg via ORAL
  Filled 2022-01-19: qty 2

## 2022-01-19 MED ORDER — PROMETHAZINE HCL 25 MG/ML IJ SOLN: 6.2500 mg | INTRAMUSCULAR | Status: DC | PRN

## 2022-01-19 MED ORDER — FENTANYL CITRATE (PF) 250 MCG/5ML IJ SOLN
INTRAMUSCULAR | Status: DC | PRN
Start: 2022-01-19 — End: 2022-01-19
  Administered 2022-01-19: 100 ug via INTRAVENOUS

## 2022-01-19 MED ORDER — DOCUSATE SODIUM 100 MG PO CAPS: 100.0000 mg | ORAL_CAPSULE | Freq: Every day | ORAL | Status: DC

## 2022-01-19 MED ORDER — MORPHINE SULFATE (PF) 2 MG/ML IV SOLN: 2.0000 mg | INTRAVENOUS | Status: DC | PRN

## 2022-01-19 MED ORDER — SENNOSIDES-DOCUSATE SODIUM 8.6-50 MG PO TABS: 1.0000 | ORAL_TABLET | Freq: Every evening | ORAL | Status: DC | PRN

## 2022-01-19 MED ORDER — ACETAMINOPHEN 650 MG RE SUPP: 325.0000 mg | RECTAL | Status: DC | PRN

## 2022-01-19 MED ORDER — ONDANSETRON HCL 4 MG/2ML IJ SOLN: 4.0000 mg | Freq: Four times a day (QID) | INTRAMUSCULAR | Status: DC | PRN

## 2022-01-19 MED ORDER — FLEET ENEMA 7-19 GM/118ML RE ENEM: 1.0000 | ENEMA | Freq: Once | RECTAL | Status: DC | PRN

## 2022-01-19 MED ORDER — PHENYLEPHRINE HCL-NACL 20-0.9 MG/250ML-% IV SOLN
INTRAVENOUS | Status: DC | PRN
  Administered 2022-01-19: 50 ug/min via INTRAVENOUS

## 2022-01-19 MED ORDER — PHENOL 1.4 % MT LIQD: 1.0000 | OROMUCOSAL | Status: DC | PRN

## 2022-01-19 MED ORDER — FENTANYL CITRATE (PF) 100 MCG/2ML IJ SOLN: 25.0000 ug | INTRAMUSCULAR | Status: DC | PRN

## 2022-01-19 MED ORDER — SODIUM CHLORIDE 0.9 % IV SOLN: 500.0000 mL | Freq: Once | INTRAVENOUS | Status: DC | PRN

## 2022-01-19 MED ORDER — LORATADINE 10 MG PO TABS: 10.0000 mg | ORAL_TABLET | Freq: Every day | ORAL | Status: DC

## 2022-01-19 MED ORDER — PROPOFOL 500 MG/50ML IV EMUL
INTRAVENOUS | Status: DC | PRN
  Administered 2022-01-19: 50 ug/kg/min via INTRAVENOUS

## 2022-01-19 MED ORDER — CHLORHEXIDINE GLUCONATE 0.12 % MT SOLN
15.0000 mL | Freq: Once | OROMUCOSAL | Status: AC
  Administered 2022-01-19: 15 mL via OROMUCOSAL
  Filled 2022-01-19: qty 15

## 2022-01-19 MED ORDER — PHENYLEPHRINE 80 MCG/ML (10ML) SYRINGE FOR IV PUSH (FOR BLOOD PRESSURE SUPPORT)
PREFILLED_SYRINGE | INTRAVENOUS | Status: DC | PRN
  Administered 2022-01-19: 80 ug via INTRAVENOUS

## 2022-01-19 MED ORDER — MAGNESIUM SULFATE 2 GM/50ML IV SOLN: 2.0000 g | Freq: Every day | INTRAVENOUS | Status: DC | PRN

## 2022-01-19 MED ORDER — ORAL CARE MOUTH RINSE
15.0000 mL | Freq: Once | OROMUCOSAL | Status: AC
Start: 2022-01-19 — End: 2022-01-19

## 2022-01-19 MED ORDER — SODIUM CHLORIDE 0.9 % IV SOLN: INTRAVENOUS | Status: DC

## 2022-01-19 MED ORDER — AMISULPRIDE (ANTIEMETIC) 5 MG/2ML IV SOLN
10.0000 mg | Freq: Once | INTRAVENOUS | Status: DC | PRN
Start: 2022-01-19 — End: 2022-01-19

## 2022-01-19 MED ORDER — ONDANSETRON HCL 4 MG/2ML IJ SOLN
INTRAMUSCULAR | Status: DC | PRN
  Administered 2022-01-19: 4 mg via INTRAVENOUS

## 2022-01-19 MED ORDER — POTASSIUM CHLORIDE CRYS ER 20 MEQ PO TBCR: 20.0000 meq | EXTENDED_RELEASE_TABLET | Freq: Every day | ORAL | Status: DC | PRN

## 2022-01-19 MED ORDER — LISINOPRIL 5 MG PO TABS
5.0000 mg | ORAL_TABLET | Freq: Every day | ORAL | Status: DC
  Administered 2022-01-19 – 2022-01-20 (×2): 5 mg via ORAL
  Filled 2022-01-19 (×2): qty 1

## 2022-01-19 MED ORDER — LIDOCAINE 2% (20 MG/ML) 5 ML SYRINGE
INTRAMUSCULAR | Status: DC | PRN
  Administered 2022-01-19: 40 mg via INTRAVENOUS

## 2022-01-19 MED ORDER — HEPARIN SODIUM (PORCINE) 1000 UNIT/ML IJ SOLN
INTRAMUSCULAR | Status: AC
Start: 2022-01-19 — End: ?
  Filled 2022-01-19: qty 20

## 2022-01-19 SURGICAL SUPPLY — 48 items
BAG COUNTER SPONGE SURGICOUNT (BAG) ×1 IMPLANT
CANISTER SUCT 3000ML PPV (MISCELLANEOUS) ×1 IMPLANT
CATH ROBINSON RED A/P 18FR (CATHETERS) ×1 IMPLANT
CATH SUCT 10FR WHISTLE TIP (CATHETERS) ×1 IMPLANT
CLIP VESOCCLUDE MED 6/CT (CLIP) ×1 IMPLANT
CLIP VESOCCLUDE SM WIDE 6/CT (CLIP) ×1 IMPLANT
COVER PROBE W GEL 5X96 (DRAPES) IMPLANT
DERMABOND ADVANCED (GAUZE/BANDAGES/DRESSINGS) ×2
DERMABOND ADVANCED .7 DNX12 (GAUZE/BANDAGES/DRESSINGS) ×1 IMPLANT
DRAIN CHANNEL 15F RND FF W/TCR (WOUND CARE) IMPLANT
DRAPE HALF SHEET 40X57 (DRAPES) IMPLANT
ELECT REM PT RETURN 9FT ADLT (ELECTROSURGICAL) ×1
ELECTRODE REM PT RTRN 9FT ADLT (ELECTROSURGICAL) ×1 IMPLANT
EVACUATOR SILICONE 100CC (DRAIN) IMPLANT
GLOVE SURG SS PI 7.5 STRL IVOR (GLOVE) ×3 IMPLANT
GOWN STRL REUS W/ TWL LRG LVL3 (GOWN DISPOSABLE) ×2 IMPLANT
GOWN STRL REUS W/ TWL XL LVL3 (GOWN DISPOSABLE) ×1 IMPLANT
GOWN STRL REUS W/TWL LRG LVL3 (GOWN DISPOSABLE) ×2
GOWN STRL REUS W/TWL XL LVL3 (GOWN DISPOSABLE) ×1
HEMOSTAT SNOW SURGICEL 2X4 (HEMOSTASIS) IMPLANT
INSERT FOGARTY SM (MISCELLANEOUS) IMPLANT
KIT BASIN OR (CUSTOM PROCEDURE TRAY) ×1 IMPLANT
KIT SHUNT ARGYLE CAROTID ART 6 (VASCULAR PRODUCTS) IMPLANT
KIT TURNOVER KIT B (KITS) ×1 IMPLANT
NDL HYPO 25GX1X1/2 BEV (NEEDLE) IMPLANT
NEEDLE HYPO 25GX1X1/2 BEV (NEEDLE) IMPLANT
NS IRRIG 1000ML POUR BTL (IV SOLUTION) ×3 IMPLANT
PACK CAROTID (CUSTOM PROCEDURE TRAY) ×1 IMPLANT
PAD ARMBOARD 7.5X6 YLW CONV (MISCELLANEOUS) ×2 IMPLANT
PATCH VASC XENOSURE 1CMX6CM (Vascular Products) ×1 IMPLANT
PATCH VASC XENOSURE 1X6 (Vascular Products) IMPLANT
POSITIONER HEAD DONUT 9IN (MISCELLANEOUS) ×1 IMPLANT
SET WALTER ACTIVATION W/DRAPE (SET/KITS/TRAYS/PACK) IMPLANT
SHUNT CAROTID BYPASS 10 (VASCULAR PRODUCTS) IMPLANT
SHUNT CAROTID BYPASS 12FRX15.5 (VASCULAR PRODUCTS) IMPLANT
SURGIFLO W/THROMBIN 8M KIT (HEMOSTASIS) IMPLANT
SUT ETHILON 3 0 PS 1 (SUTURE) IMPLANT
SUT PROLENE 6 0 BV (SUTURE) ×2 IMPLANT
SUT PROLENE 7 0 BV1 MDA (SUTURE) IMPLANT
SUT SILK 3 0 (SUTURE)
SUT SILK 3-0 18XBRD TIE 12 (SUTURE) IMPLANT
SUT VIC AB 3-0 SH 27 (SUTURE) ×2
SUT VIC AB 3-0 SH 27X BRD (SUTURE) ×2 IMPLANT
SUT VIC AB 3-0 X1 27 (SUTURE) ×1 IMPLANT
SYR CONTROL 10ML LL (SYRINGE) IMPLANT
TAPE STRIPS DRAPE STRL (GAUZE/BANDAGES/DRESSINGS) IMPLANT
TOWEL GREEN STERILE (TOWEL DISPOSABLE) ×1 IMPLANT
WATER STERILE IRR 1000ML POUR (IV SOLUTION) ×1 IMPLANT

## 2022-01-19 NOTE — Anesthesia Procedure Notes (Signed)
Arterial Line Insertion Start/End9/10/2021 11:20 AM, 01/19/2022 11:23 AM Performed by: Effie Berkshire, MD, Minerva Ends, CRNA, CRNA  Patient location: Pre-op. Preanesthetic checklist: patient identified, IV checked, site marked, risks and benefits discussed, surgical consent, monitors and equipment checked, pre-op evaluation, timeout performed and anesthesia consent Lidocaine 1% used for infiltration Right, radial was placed Catheter size: 20 G Hand hygiene performed  and maximum sterile barriers used   Attempts: 1 Procedure performed using ultrasound guided technique. Following insertion, dressing applied. Post procedure assessment: normal and unchanged  Patient tolerated the procedure well with no immediate complications.

## 2022-01-19 NOTE — Anesthesia Procedure Notes (Signed)
Arterial Line Insertion Performed by: Brunetta Newingham B, CRNA, CRNA  Patient location: Pre-op. Preanesthetic checklist: patient identified, IV checked, site marked, risks and benefits discussed, surgical consent, monitors and equipment checked, pre-op evaluation, timeout performed and anesthesia consent Lidocaine 1% used for infiltration Left, radial was placed Catheter size: 20 G Hand hygiene performed , maximum sterile barriers used  and Seldinger technique used Allen's test indicative of satisfactory collateral circulation Attempts: 1 Procedure performed without using ultrasound guided technique. Following insertion, dressing applied and Biopatch. Post procedure assessment: normal  Patient tolerated the procedure well with no immediate complications.    

## 2022-01-19 NOTE — Progress Notes (Signed)
Patient arrived to 4E15 from PACU after right CEA with Dr. Trula Slade.  Telemetry monitor applied and CCMD notified.  CHG bath and skin assessment completed.  Patient oriented to unit and room to include call light and phone.  All needs addressed.

## 2022-01-19 NOTE — Anesthesia Procedure Notes (Signed)
Procedure Name: Intubation Date/Time: 01/19/2022 11:02 AM  Performed by: Minerva Ends, CRNAPre-anesthesia Checklist: Patient identified, Emergency Drugs available, Suction available and Patient being monitored Patient Re-evaluated:Patient Re-evaluated prior to induction Oxygen Delivery Method: Circle system utilized Preoxygenation: Pre-oxygenation with 100% oxygen Induction Type: IV induction Ventilation: Mask ventilation without difficulty Laryngoscope Size: Mac and 3 Grade View: Grade I Tube type: Oral Number of attempts: 1 Airway Equipment and Method: Stylet and Oral airway Placement Confirmation: ETT inserted through vocal cords under direct vision, positive ETCO2 and breath sounds checked- equal and bilateral Secured at: 23 cm Tube secured with: Tape Dental Injury: Teeth and Oropharynx as per pre-operative assessment

## 2022-01-19 NOTE — Discharge Instructions (Signed)
   [facility]  Discharge Instructions   Carotid Surgery  Please refer to the following instructions for your post-procedure care. Your surgeon or physician assistant will discuss any changes with you.  Activity  You are encouraged to walk as much as you can. You can slowly return to normal activities but must avoid strenuous activity and heavy lifting until your doctor tell you it's okay. Avoid activities such as vacuuming or swinging a golf club. You can drive after one week if you are comfortable and you are no longer taking prescription pain medications. It is normal to feel tired for serval weeks after your surgery. It is also normal to have difficulty with sleep habits, eating, and bowel movements after surgery. These will go away with time.  Bathing/Showering  Shower daily after you go home. Do not soak in a bathtub, hot tub, or swim until the incision heals completely.  Incision Care  Shower every day. Clean your incision with mild soap and water. Pat the area dry with a clean towel. You do not need a bandage unless otherwise instructed. Do not apply any ointments or creams to your incision. You may have skin glue on your incision. Do not peel it off. It will come off on its own in about one week. Your incision may feel thickened and raised for several weeks after your surgery. This is normal and the skin will soften over time.   For Men Only: It's okay to shave around the incision but do not shave the incision itself for 2 weeks. It is common to have numbness under your chin that could last for several months.  Diet  Resume your normal diet. There are no special food restrictions following this procedure. A low fat/low cholesterol diet is recommended for all patients with vascular disease. In order to heal from your surgery, it is CRITICAL to get adequate nutrition. Your body requires vitamins, minerals, and protein. Vegetables are the best source of  vitamins and minerals. Vegetables also provide the perfect balance of protein. Processed food has little nutritional value, so try to avoid this.  Medications  Resume taking all of your medications unless your doctor or physician assistant tells you not to. If your incision is causing pain, you may take over-the- counter pain relievers such as acetaminophen (Tylenol). If you were prescribed a stronger pain medication, please be aware these medications can cause nausea and constipation. Prevent nausea by taking the medication with a snack or meal. Avoid constipation by drinking plenty of fluids and eating foods with a high amount of fiber, such as fruits, vegetables, and grains.   Do not take Tylenol if you are taking prescription pain medications.  Follow Up  Our office will schedule a follow up appointment 2-3 weeks following discharge.  Please call us immediately for any of the following conditions  . Increased pain, redness, drainage (pus) from your incision site. . Fever of 101 degrees or higher. . If you should develop stroke (slurred speech, difficulty swallowing, weakness on one side of your body, loss of vision) you should call 911 and go to the nearest emergency room. .  Reduce your risk of vascular disease:  . Stop smoking. If you would like help call [facility] at 1-800-QUIT-NOW (1-800-784-8669) or [place] at [phone]. . Manage your cholesterol . Maintain a desired weight . Control your diabetes . Keep your blood pressure down .  If you have any questions, please call the office at [phone]. 

## 2022-01-19 NOTE — Progress Notes (Addendum)
  Progress Note    01/19/2022 2:48 PM Day of Surgery  Subjective:  no complaints. Sitting up on side of bed. Minimal incisional tenderness   Vitals:   01/19/22 1348 01/19/22 1400  BP: 116/80 (!) 131/95  Pulse: (!) 55 (!) 113  Resp: 12 18  Temp:    SpO2: 98% 93%   Physical Exam: Cardiac:  regular Lungs:  non labored Incisions:  right neck incision is c/d/I without swelling or hematoma Extremities:  moving all extremities without deficits Neurologic: CN intact. Speech coherent. Smile symmetric. Tongue midline  CBC    Component Value Date/Time   WBC 7.0 01/14/2022 1040   RBC 4.49 01/14/2022 1040   HGB 14.7 01/14/2022 1040   HCT 44.2 01/14/2022 1040   PLT 237 01/14/2022 1040   MCV 98.4 01/14/2022 1040   MCH 32.7 01/14/2022 1040   MCHC 33.3 01/14/2022 1040   RDW 12.3 01/14/2022 1040   LYMPHSABS 2.2 04/05/2017 0857   MONOABS 0.8 04/05/2017 0857   EOSABS 0.2 04/05/2017 0857   BASOSABS 0.0 04/05/2017 0857    BMET    Component Value Date/Time   NA 139 01/14/2022 1040   K 5.0 01/14/2022 1040   CL 104 01/14/2022 1040   CO2 29 01/14/2022 1040   GLUCOSE 101 (H) 01/14/2022 1040   BUN 17 01/14/2022 1040   CREATININE 1.65 (H) 01/14/2022 1040   CALCIUM 9.5 01/14/2022 1040   GFRNONAA 45 (L) 01/14/2022 1040   GFRAA >60 05/22/2017 0213    INR    Component Value Date/Time   INR 0.9 01/14/2022 1040     Intake/Output Summary (Last 24 hours) at 01/19/2022 1448 Last data filed at 01/19/2022 1256 Gross per 24 hour  Intake 1100 ml  Output 50 ml  Net 1050 ml     Assessment/Plan:  71 y.o. male is s/p right CEA Day of Surgery   Doing well post op Incision is c/d/I without swelling or hematoma Neurologically intact Anticipate d/c tomorrow if he continues to do well overnight  Karoline Caldwell, PA-C [facility] [phone] 01/19/2022 2:48 PM  I agree with the above.  I have seen and evaluated the patient.  He has recently status post right carotid  endarterectomy.  He is doing very well.  He is neurologically intact.  His incision is without hematoma.  Annamarie Major

## 2022-01-19 NOTE — Anesthesia Postprocedure Evaluation (Signed)
Anesthesia Post Note  Patient: Jeremy Larson  Procedure(s) Performed: RIGHT ENDARTERECTOMY CAROTID (Right: Neck) PATCH ANGIOPLASTY OF RIGHT CAROTID ARTERY USING XENOSURE BOVINE BIOLOGIC PATCH (Right: Neck)     Patient location during evaluation: PACU Anesthesia Type: General Level of consciousness: awake and alert Pain management: pain level controlled Vital Signs Assessment: post-procedure vital signs reviewed and stable Respiratory status: spontaneous breathing, nonlabored ventilation, respiratory function stable and patient connected to nasal cannula oxygen Cardiovascular status: blood pressure returned to baseline and stable Postop Assessment: no apparent nausea or vomiting Anesthetic complications: no   No notable events documented.  Last Vitals:  Vitals:   01/19/22 1500 01/19/22 1700  BP: (!) 122/59 (!) 128/57  Pulse: (!) 51 (!) 55  Resp: 16 15  Temp:    SpO2: 95% 97%    Last Pain:  Vitals:   01/19/22 1400  TempSrc:   PainSc: 2                  Effie Berkshire

## 2022-01-19 NOTE — Transfer of Care (Signed)
Immediate Anesthesia Transfer of Care Note  Patient: Jeremy Larson  Procedure(s) Performed: RIGHT ENDARTERECTOMY CAROTID (Right: Neck) PATCH ANGIOPLASTY OF RIGHT CAROTID ARTERY USING XENOSURE BOVINE BIOLOGIC PATCH (Right: Neck)  Patient Location: PACU  Anesthesia Type:General  Level of Consciousness: awake, alert  and oriented  Airway & Oxygen Therapy: Patient Spontanous Breathing and Patient connected to nasal cannula oxygen  Post-op Assessment: Report given to RN and Post -op Vital signs reviewed and stable  Post vital signs: Reviewed and stable  Last Vitals:  Vitals Value Taken Time  BP 142/67 01/19/22 1300  Temp 36.6 C 01/19/22 1300  Pulse 91 01/19/22 1304  Resp 11 01/19/22 1304  SpO2 92 % 01/19/22 1304  Vitals shown include unvalidated device data.  Last Pain:  Vitals:   01/19/22 0933  TempSrc:   PainSc: 0-No pain         Complications: No notable events documented.

## 2022-01-19 NOTE — Op Note (Signed)
Patient name: Jeremy Larson MRN: [id number] DOB: [birth year] Sex: male  01/19/2022 Pre-operative Diagnosis: Asymptomatic   right carotid stenosis Post-operative diagnosis:  Same Surgeon:  Annamarie Major Assistants:  Ivin Booty, PA Procedure:    right carotid Endarterectomy with bovine pericardial patch angioplasty Anesthesia:  General Blood Loss:  minimal Specimens:  none  Findings:  90 %stenosis; Thrombus:  none  Indications: This is a 71 year old gentleman found to have a high-grade right carotid stenosis on ultrasound.  He is asymptomatic.  He comes in today for surgical repair.  The details of the operation were discussed with the patient and his wife including the risk of stroke, nerve injury, cardiopulmonary complications.  All questions were answered and he wished to proceed.  Procedure:  The patient was identified in the holding area and taken to [place] [facility]  The patient was then placed supine on the table.   General endotrachial anesthesia was administered.  The patient was prepped and draped in the usual sterile fashion.  A time out was called and antibiotics were administered.    A PA was necessary to expedite the procedure and assist with technical details. The incision was made along the anterior border of the right sternocleidomastoid muscle.  Cautery was used to dissect through the subcutaneous tissue.  The platysma muscle was divided with cautery.  The internal jugular vein was exposed along its anterior medial border.  The common facial vein was exposed and then divided between 2-0 silk ties and metal clips.  The common carotid artery was then circumferentially exposed and encircled with an umbilical tape.  The vagus nerve was identified and protected.  Next sharp dissection was used to expose the external carotid artery and the superior thyroid artery.  The were encircled with a blue vessel loop and a 2-0 silk tie respectively.  Finally, the internal carotid was carefully  dissected free.  An umbilical tape was placed around the internal carotid artery distal to the diseased segment.  The hypoglossal nerve was visualized throughout and protected.  The patient was given systemic heparinization.  A bovine carotid patch was selected and prepared on the back table.  A 10 french shunt was also prepared.  After blood pressure readings were appropriate and the heparin had been given time to circulate, the internal carotid artery was occluded with a baby Gregory clamp.  The external and common carotid arteries were then occluded with vascular clamps and the 2-0 tie tightened on the superior thyroid artery.  A #11 blade was used to make an arteriotomy in the common carotid artery.  This was extended with Potts scissors along the anterior and lateral border of the common and internal carotid artery.  Approximately 90% stenosis was identified.  There was no thrombus identified.  The 10 french shunt was not placed, as there was excellent backbleeding..  A kleiner kuntz elevator was used to perform endarterectomy.  An eversion endarterectomy was performed in the external carotid artery.  A good distal endpoint was obtained in the internal carotid artery.  The specimen was removed and sent to pathology.  Heparinized saline was used to irrigate the endarterectomized field.  All potential embolic debris was removed.  Bovine pericardial patch angioplasty was then performed using a running 6-0 Prolene.  The common internal and external carotid arteries were all appropriately flushed. The artery was again irrigated with heparin saline.  The anastomosis was then secured. The clamp was first released on the external carotid artery followed by the  common carotid artery approximately 30 seconds later, bloodflow was reestablish through the internal carotid artery.  Next, a hand-held  Doppler was used to evaluate the signals in the common, external, and internal  carotid arteries, all of which had  appropriate signals. I then administered  50 mg protamine. The wound was then irrigated.  After hemostasis was achieved, the carotid sheath was reapproximated with 3-0 Vicryl. The  platysma muscle was reapproximated with running 3-0 Vicryl. The skin  was closed with 4-0 Vicryl. Dermabond was placed on the skin.  The PA helped with suction, retraction, and following of the sutures during the anastomosis.  The  patient was then successfully extubated. His neurologic exam was  similar to his preprocedural exam. The patient was then taken to recovery room  in stable condition. There were no complications.     Disposition:  To PACU in stable condition.  Relevant Operative Details: Approximate 90% stenosis at the bifurcation.  Endarterectomy was performed with bovine patch angioplasty.  I did not use a shunt as there was pulsatile backbleeding  V. Annamarie Major, M.D., [facility] [place] Office: [phone] Pager:  [phone]

## 2022-01-19 NOTE — Interval H&P Note (Signed)
History and Physical Interval Note:  01/19/2022 10:14 AM  Jeremy Larson  has presented today for surgery, with the diagnosis of CAROTID ARTERY STENOSIS.  The various methods of treatment have been discussed with the patient and family. After consideration of risks, benefits and other options for treatment, the patient has consented to  Procedure(s): RIGHT ENDARTERECTOMY CAROTID (Right) as a surgical intervention.  The patient's history has been reviewed, patient examined, no change in status, stable for surgery.  I have reviewed the patient's chart and labs.  Questions were answered to the patient's satisfaction.     Annamarie Major Reviewed details of surgery with patient and wife.  All questions answered  WB

## 2022-01-20 ENCOUNTER — Encounter (HOSPITAL_COMMUNITY): Payer: Self-pay | Admitting: Surgery

## 2022-01-20 LAB — LIPID PANEL
Cholesterol: 160 mg/dL (ref 0–200)
HDL: 44 mg/dL (ref >40)
LDL Cholesterol: 82 mg/dL (ref 0–99)
Total CHOL/HDL Ratio: 3.6 RATIO
Triglycerides: 169 mg/dL — ABNORMAL HIGH (ref <150)
VLDL: 34 mg/dL (ref 0–40)

## 2022-01-20 LAB — BASIC METABOLIC PANEL
Anion gap: 8 (ref 5–15)
BUN: 19 mg/dL (ref 8–23)
CO2: 22 mmol/L (ref 22–32)
Calcium: 8.5 mg/dL — ABNORMAL LOW (ref 8.9–10.3)
Chloride: 104 mmol/L (ref 98–111)
Creatinine, Ser: 1.5 mg/dL — ABNORMAL HIGH (ref 0.61–1.24)
GFR, Estimated: 50 mL/min — ABNORMAL LOW (ref >60)
Glucose, Bld: 130 mg/dL — ABNORMAL HIGH (ref 70–99)
Potassium: 4.4 mmol/L (ref 3.5–5.1)
Sodium: 134 mmol/L — ABNORMAL LOW (ref 135–145)

## 2022-01-20 LAB — CBC
HCT: 36.5 % — ABNORMAL LOW (ref 39.0–52.0)
Hemoglobin: 12.7 g/dL — ABNORMAL LOW (ref 13.0–17.0)
MCH: 33.2 pg (ref 26.0–34.0)
MCHC: 34.8 g/dL (ref 30.0–36.0)
MCV: 95.5 fL (ref 80.0–100.0)
Platelets: 210 10*3/uL (ref 150–400)
RBC: 3.82 MIL/uL — ABNORMAL LOW (ref 4.22–5.81)
RDW: 12.4 % (ref 11.5–15.5)
WBC: 15.9 10*3/uL — ABNORMAL HIGH (ref 4.0–10.5)
nRBC: 0 % (ref 0.0–0.2)

## 2022-01-20 MED ORDER — TRAMADOL HCL 50 MG PO TABS: 50.0000 mg | ORAL_TABLET | Freq: Four times a day (QID) | ORAL | 0 refills | Status: DC | PRN

## 2022-01-20 MED ORDER — ROSUVASTATIN CALCIUM 40 MG PO TABS: 40.0000 mg | ORAL_TABLET | Freq: Every day | ORAL | 3 refills | Status: DC

## 2022-01-20 NOTE — Progress Notes (Signed)
Order received to discharge patient.  Telemetry monitor removed and CCMD notified.  PIV access removed x2.  Discharge instructions, follow up, medications and instructions for their use discussed with patient.

## 2022-01-20 NOTE — Progress Notes (Signed)
    Subjective  - POD #1, status post right carotid endarterectomy  No acute overnight events   Physical Exam:  Incision is without hematoma Neurologically intact       Assessment/Plan:  POD #1  Patient is stable for discharge He will follow-up with Dr. Nolon Nations [place] Mczeal 01/20/2022 8:00 AM --  Vitals:   01/20/22 0340 01/20/22 0439  BP: 110/66 131/67  Pulse: 65 77  Resp: 15 16  Temp: 98.3 F (36.8 C) 98 F (36.7 C)  SpO2: 97% 97%    Intake/Output Summary (Last 24 hours) at 01/20/2022 0800 Last data filed at 01/20/2022 0431 Gross per 24 hour  Intake 1100 ml  Output 650 ml  Net 450 ml     Laboratory CBC    Component Value Date/Time   WBC 15.9 (H) 01/20/2022 0423   HGB 12.7 (L) 01/20/2022 0423   HCT 36.5 (L) 01/20/2022 0423   PLT 210 01/20/2022 0423    BMET    Component Value Date/Time   NA 134 (L) 01/20/2022 0423   K 4.4 01/20/2022 0423   CL 104 01/20/2022 0423   CO2 22 01/20/2022 0423   GLUCOSE 130 (H) 01/20/2022 0423   BUN 19 01/20/2022 0423   CREATININE 1.50 (H) 01/20/2022 0423   CALCIUM 8.5 (L) 01/20/2022 0423   GFRNONAA 50 (L) 01/20/2022 0423   GFRAA >60 05/22/2017 0213    COAG Lab Results  Component Value Date   INR 0.9 01/14/2022   INR 1.43 05/18/2017   INR 0.91 05/15/2017   No results found for: "PTT"  Antibiotics Anti-infectives (From admission, onward)    Start     Dose/Rate Route Frequency Ordered Stop   01/19/22 1430  ceFAZolin (ANCEF) IVPB 2g/100 mL premix        2 g 200 mL/hr over 30 Minutes Intravenous Every 8 hours 01/19/22 1338 01/19/22 2209   01/19/22 0904  ceFAZolin (ANCEF) IVPB 2g/100 mL premix        2 g 200 mL/hr over 30 Minutes Intravenous 30 min pre-op 01/19/22 0904 01/19/22 1118        V. Leia Alf, M.D., [facility] [place] Office: [phone] Pager:  [phone]

## 2022-01-20 NOTE — Progress Notes (Signed)
PHARMACIST LIPID MONITORING   Jeremy Larson is a 71 y.o. male admitted on 01/19/2022 with R carotid endarterectomy.  Pharmacy has been consulted to optimize lipid-lowering therapy with the indication of secondary prevention for clinical ASCVD.  Recent Labs:  Lipid Panel (last 6 months):   Lab Results  Component Value Date   CHOL 160 01/20/2022   TRIG 169 (H) 01/20/2022   HDL 44 01/20/2022   CHOLHDL 3.6 01/20/2022   VLDL 34 01/20/2022   LDLCALC 82 01/20/2022    Hepatic function panel (last 6 months):   Lab Results  Component Value Date   AST 23 01/14/2022   ALT 24 01/14/2022   ALKPHOS 71 01/14/2022   BILITOT 0.6 01/14/2022    SCr (since admission):   Serum creatinine: 1.5 mg/dL (H) 01/20/22 0423 Estimated creatinine clearance: 46.5 mL/min (A)  Current therapy and lipid therapy tolerance Current lipid-lowering therapy: crestor 5 mg Previous lipid-lowering therapies (if applicable): crestor 5 mg, atorvastatin 20 mg Documented or reported allergies or intolerances to lipid-lowering therapies (if applicable): None  Assessment:   Patient agrees with changes to lipid-lowering therapy  Plan:    1.Statin intensity (high intensity recommended for all patients regardless of the LDL):  Add or increase statin to high intensity.  2.Add ezetimibe (if any one of the following):   Not indicated at this time.  3.Refer to lipid clinic:   No  4.Follow-up with:  Primary care provider - Caryl Bis, MD  5.Follow-up labs after discharge:  Changes in lipid therapy were made. Check a lipid panel in 8-12 weeks then annually.      Antonietta Jewel, PharmD, [place] Clinical Pharmacist  Phone: [phone] 01/20/2022 8:44 AM  Please check AMION for all [place] phone numbers After 10:00 PM, call [place] [phone]

## 2022-01-23 NOTE — Discharge Summary (Signed)
Carotid Discharge Summary     Jeremy Larson 20-Nov-1950 72 y.o. male  [id number]  Admission Date: 01/19/2022  Discharge Date: 01/20/2022  Physician: Napoleon Form, MD  Admission Diagnosis: Carotid stenosis [I65.29] Carotid stenosis, asymptomatic, right [facility]   Hospital Course:  The patient was admitted to the hospital and taken to the operating room on 01/19/2022 and underwent Right carotid endarterectomy.  The pt tolerated the procedure well and was transported to the PACU in excellent condition.   By POD 1, the pt neuro status remained intact. Right neck incision remained clean, dry and intact without swelling or hematoma. Hemodynamically stable. Minimal surgical site pain.  The remainder of the hospital course consisted of increasing mobilization and increasing intake of solids without difficulty.  He remained stable for discharge post operative day 1. He will go home on his home medications as prescribed. He will continue Aspirin, and statin. PDMP was reviewed and Tramadol was sent to his patients pharmacy for post operative pain management.    No results for input(s): "NA", "K", "CL", "CO2", "GLUCOSE", "BUN", "CALCIUM" in the last 72 hours.  Invalid input(s): "CRATININE" No results for input(s): "WBC", "HGB", "HCT", "PLT" in the last 72 hours. No results for input(s): "INR" in the last 72 hours.   Discharge Instructions     Call MD for:  difficulty breathing, headache or visual disturbances   Complete by: As directed    Call MD for:  redness, tenderness, or signs of infection (pain, swelling, redness, odor or green/yellow discharge around incision site)   Complete by: As directed    Call MD for:  severe uncontrolled pain   Complete by: As directed    Call MD for:  temperature >100.4   Complete by: As directed    Diet - low sodium heart healthy   Complete by: As directed    Discharge patient   Complete by: As directed    Discharge disposition: 01-Home  or Self Care   Discharge patient date: 01/20/2022   Discharge wound care:   Complete by: As directed    Wash incision with mild soap and water, pat dry. Do not apply any lotions or creams   Driving Restrictions   Complete by: As directed    No driving while taking pain medication   Increase activity slowly   Complete by: As directed    Lifting restrictions   Complete by: As directed    No heavy lifting, pushing, pulling >10 lbs for 2 weeks       Discharge Diagnosis:  Carotid stenosis [I65.29] Carotid stenosis, asymptomatic, right [I65.21]  Secondary Diagnosis: Patient Active Problem List   Diagnosis Date Noted   Carotid stenosis 01/19/2022   Carotid stenosis, asymptomatic, right 01/19/2022   Dysphagia 08/26/2021   Positive colorectal cancer screening using Cologuard test 09/02/2020   GERD (gastroesophageal reflux disease) 09/02/2020   Postoperative atrial fibrillation ([place]) 06/05/2017   S/P AVR (aortic valve replacement) 05/18/2017   Severe aortic valve stenosis    ESSENTIAL HYPERTENSION, BENIGN 01/25/2010   AORTIC VALVE DISORDERS 01/25/2010   DIZZINESS AND GIDDINESS 01/25/2010   Past Medical History:  Diagnosis Date   Aortic stenosis    a. s/p AVR on 05/18/2017 with 23 mm Edwards Magna-Ease Pericardial valve   Benign lipomatous neoplasm of skin and subcutaneous tissue of right arm    Chronic systolic (congestive) heart failure ([place])    a. 05/2017: TEE showing EF 40-45%   Dyspnea    GERD (gastroesophageal reflux disease)  Hypertension     Allergies as of 01/20/2022       Reactions   Oxycodone Other (See Comments)   "Makes me go crazy"        Medication List     TAKE these medications    aspirin EC 81 MG tablet Take 81 mg by mouth in the morning.   cetirizine 10 MG tablet Commonly known as: ZYRTEC Take 10 mg by mouth in the morning.   lisinopril 5 MG tablet Commonly known as: ZESTRIL TAKE ONE TABLET BY MOUTH ONCE DAILY   metoprolol succinate 25 MG  24 hr tablet Commonly known as: TOPROL-XL TAKE ONE TABLET BY MOUTH DAILY   multivitamin with minerals Tabs tablet Take 1 tablet by mouth in the morning.   pantoprazole 40 MG tablet Commonly known as: PROTONIX Take 40 mg by mouth in the morning.   rosuvastatin 40 MG tablet Commonly known as: CRESTOR Take 1 tablet (40 mg total) by mouth at bedtime. What changed:  medication strength how much to take   traMADol 50 MG tablet Commonly known as: ULTRAM Take 1 tablet (50 mg total) by mouth every 6 (six) hours as needed for moderate pain.               Discharge Care Instructions  (From admission, onward)           Start     Ordered   01/20/22 0000  Discharge wound care:       Comments: Wash incision with mild soap and water, pat dry. Do not apply any lotions or creams   01/20/22 0832             Discharge Instructions:   [facility] Discharge Instructions Carotid Endarterectomy (CEA)  Please refer to the following instructions for your post-procedure care. Your surgeon or physician assistant will discuss any changes with you.  Activity  You are encouraged to walk as much as you can. You can slowly return to normal activities but must avoid strenuous activity and heavy lifting until your doctor tell you it's OK. Avoid activities such as vacuuming or swinging a golf club. You can drive after one week if you are comfortable and you are no longer taking prescription pain medications. It is normal to feel tired for serval weeks after your surgery. It is also normal to have difficulty with sleep habits, eating, and bowel movements after surgery. These will go away with time.  Bathing/Showering  You may shower after you come home. Do not soak in a bathtub, hot tub, or swim until the incision heals completely.  Incision Care  Shower every day. Clean your incision with mild soap and water. Pat the area dry with a clean towel. You do  not need a bandage unless otherwise instructed. Do not apply any ointments or creams to your incision. You may have skin glue on your incision. Do not peel it off. It will come off on its own in about one week. Your incision may feel thickened and raised for several weeks after your surgery. This is normal and the skin will soften over time. For Men Only: It's OK to shave around the incision but do not shave the incision itself for 2 weeks. It is common to have numbness under your chin that could last for several months.  Diet  Resume your normal diet. There are no special food restrictions following this procedure. A low fat/low cholesterol diet is recommended for all patients with  vascular disease. In order to heal from your surgery, it is CRITICAL to get adequate nutrition. Your body requires vitamins, minerals, and protein. Vegetables are the best source of vitamins and minerals. Vegetables also provide the perfect balance of protein. Processed food has little nutritional value, so try to avoid this.  Medications  Resume taking all of your medications unless your doctor or physician assistant tells you not to.  If your incision is causing pain, you may take over-the- counter pain relievers such as acetaminophen (Tylenol). If you were prescribed a stronger pain medication, please be aware these medications can cause nausea and constipation.  Prevent nausea by taking the medication with a snack or meal. Avoid constipation by drinking plenty of fluids and eating foods with a high amount of fiber, such as fruits, vegetables, and grains. Do not take Tylenol if you are taking prescription pain medications.  Follow Up  Our office will schedule a follow up appointment 2-3 weeks following discharge.  Please call us immediately for any of the following conditions  Increased pain, redness, drainage (pus) from your incision site. Fever of 101 degrees or higher. If you should develop stroke (slurred speech,  difficulty swallowing, weakness on one side of your body, loss of vision) you should call 911 and go to the nearest emergency room.  Reduce your risk of vascular disease:  Stop smoking. If you would like help call [facility] at 1-800-QUIT-NOW 925-488-9897) or [place] at [phone]. Manage your cholesterol Maintain a desired weight Control your diabetes Keep your blood pressure down  If you have any questions, please call the office at [phone].  Prescriptions given: Tramadol 50 mg q 6 hours #16 No Refill  Disposition: Home  Patient's condition: is Excellent  Follow up: 1. Dr. Trula Slade in 2 weeks.   Nayleen Janosik PA-C [facility] [phone]   --- For [facility] Registry use ---   Modified Rankin score at D/C (0-6): 0  IV medication needed for:  1. Hypertension: No 2. Hypotension: No  Post-op Complications: No  1. Post-op CVA or TIA: No  If yes: Event classification (right eye, left eye, right cortical, left cortical, verterobasilar, other): N/a  If yes: Timing of event (intra-op, <6 hrs post-op, >=6 hrs post-op, unknown): n/a  2. CN injury: No  If yes: CN not injuried   3. Myocardial infarction: No  If yes: Dx by (EKG or clinical, Troponin): n/a  4.  CHF: No  5.  Dysrhythmia (new): No  6. Wound infection: No  7. Reperfusion symptoms: No  8. Return to OR: No  If yes: return to OR for (bleeding, neurologic, other CEA incision, other): n/a  Discharge medications: Statin use:  Yes ASA use:  Yes   Beta blocker use:  Yes ACE-Inhibitor use:  Yes  ARB use:  No CCB use: No P2Y12 Antagonist use: Yes, '[ ]'$  Plavix, '[ ]'$  Plasugrel, '[ ]'$  Ticlopinine, '[ ]'$  Ticagrelor, '[ ]'$  Other, '[ ]'$  No for medical reason, '[ ]'$  Non-compliant, '[ ]'$  Not-indicated Anti-coagulant use:  No, '[ ]'$  Warfarin, '[ ]'$  Rivaroxaban, '[ ]'$  Dabigatran,

## 2022-01-26 ENCOUNTER — Telehealth: Payer: Self-pay

## 2022-01-26 ENCOUNTER — Other Ambulatory Visit: Payer: Self-pay

## 2022-01-26 DIAGNOSIS — K319 Disease of stomach and duodenum, unspecified: Secondary | ICD-10-CM

## 2022-01-26 NOTE — Telephone Encounter (Signed)
EUS has been scheduled with GM at [facility] on 10/30 at 930 am  Left message on machine to call back

## 2022-01-26 NOTE — Telephone Encounter (Signed)
I spoke with the pt and he tells that he had recent surgery on his neck and wants to call back after he has healed. I have cancelled the appt for 10/30.  I will enter a recall for 6 months

## 2022-01-27 ENCOUNTER — Other Ambulatory Visit: Payer: Self-pay | Admitting: Cardiology

## 2022-02-09 ENCOUNTER — Ambulatory Visit (INDEPENDENT_AMBULATORY_CARE_PROVIDER_SITE_OTHER): Payer: Medicare Other | Admitting: Vascular Surgery

## 2022-02-09 ENCOUNTER — Encounter: Payer: Self-pay | Admitting: Vascular Surgery

## 2022-02-09 VITALS — BP 122/76 | HR 79 | Temp 98.1°F | Ht 69.0 in | Wt 173.6 lb

## 2022-02-09 DIAGNOSIS — Z9889 Other specified postprocedural states: Secondary | ICD-10-CM

## 2022-02-09 NOTE — Progress Notes (Signed)
   [facility] [place]  Patient name: Jeremy Larson MRN: [id number] DOB: 1950-12-12 Sex: male  REASON FOR VISIT: Follow-up right carotid endarterectomy for asymptomatic disease with Dr. Trula Slade on 01/19/2022  HPI: Jeremy Larson is a 71 y.o. male who today for follow-up.  He did well was discharged postoperative day 1.  He reports that he has had no discomfort following surgery and has not taken pain medication.  He has had no stroke symptoms  Current Outpatient Medications  Medication Sig Dispense Refill   aspirin EC 81 MG tablet Take 81 mg by mouth in the morning.     cetirizine (ZYRTEC) 10 MG tablet Take 10 mg by mouth in the morning.     lisinopril (ZESTRIL) 5 MG tablet TAKE ONE TABLET BY MOUTH ONCE DAILY 90 tablet 3   metoprolol succinate (TOPROL-XL) 25 MG 24 hr tablet TAKE ONE TABLET BY MOUTH DAILY *NEED OFFICE VISIT FOR FURTHER REFILLS* 90 tablet 3   Multiple Vitamin (MULTIVITAMIN WITH MINERALS) TABS tablet Take 1 tablet by mouth in the morning.     pantoprazole (PROTONIX) 40 MG tablet Take 40 mg by mouth in the morning.     rosuvastatin (CRESTOR) 40 MG tablet Take 1 tablet (40 mg total) by mouth at bedtime. 30 tablet 3   traMADol (ULTRAM) 50 MG tablet Take 1 tablet (50 mg total) by mouth every 6 (six) hours as needed for moderate pain. (Patient not taking: Reported on 02/09/2022) 16 tablet 0   No current facility-administered medications for this visit.     PHYSICAL EXAM: Vitals:   02/09/22 0933 02/09/22 0938 02/09/22 0939  BP: 132/75 102/65 122/76  Pulse: 79    Temp: 98.1 F (36.7 C)    Weight: 173 lb 9.6 oz (78.7 kg)    Height: '5\' 9"'$  (1.753 m)      GENERAL: The patient is a well-nourished male, in no acute distress. The vital signs are documented above. Right neck is well-healed.  No bruit noted.  2+ radial pulses bilaterally.  MEDICAL ISSUES: Stable status post right carotid endarterectomy.  He will resume full  activities.  We will see him again in 9 months with carotid duplex.  He knows to present immediately for any wound issues or neurologic deficits   Rosetta Posner, MD FACS [facility] [place] Office Tel [phone]  Note: Portions of this report may have been transcribed using voice recognition software.  Every effort has been made to ensure accuracy; however, inadvertent computerized transcription errors may still be present.

## 2022-02-15 ENCOUNTER — Other Ambulatory Visit: Payer: Self-pay

## 2022-02-15 DIAGNOSIS — I6521 Occlusion and stenosis of right carotid artery: Secondary | ICD-10-CM

## 2022-02-15 DIAGNOSIS — R0989 Other specified symptoms and signs involving the circulatory and respiratory systems: Secondary | ICD-10-CM

## 2022-02-19 ENCOUNTER — Encounter (HOSPITAL_COMMUNITY): Payer: Self-pay

## 2022-02-19 ENCOUNTER — Other Ambulatory Visit: Payer: Self-pay

## 2022-02-19 ENCOUNTER — Emergency Department (HOSPITAL_COMMUNITY)
Admission: EM | Admit: 2022-02-19 | Discharge: 2022-02-19 | Disposition: A | Discharge status: Home / Self Care | Payer: Medicare Other | Attending: Emergency Medicine | Admitting: Emergency Medicine

## 2022-02-19 DIAGNOSIS — R509 Fever, unspecified: Secondary | ICD-10-CM | POA: Diagnosis present

## 2022-02-19 DIAGNOSIS — I10 Essential (primary) hypertension: Secondary | ICD-10-CM | POA: Diagnosis not present

## 2022-02-19 DIAGNOSIS — U071 COVID-19: Secondary | ICD-10-CM

## 2022-02-19 DIAGNOSIS — Z7982 Long term (current) use of aspirin: Secondary | ICD-10-CM | POA: Insufficient documentation

## 2022-02-19 DIAGNOSIS — Z79899 Other long term (current) drug therapy: Secondary | ICD-10-CM | POA: Insufficient documentation

## 2022-02-19 LAB — RESP PANEL BY RT-PCR (FLU A&B, COVID) ARPGX2
Influenza A by PCR: NEGATIVE
Influenza B by PCR: NEGATIVE
SARS Coronavirus 2 by RT PCR: POSITIVE — AB

## 2022-02-19 MED ORDER — ACETAMINOPHEN 325 MG PO TABS
650.0000 mg | ORAL_TABLET | Freq: Once | ORAL | Status: AC
  Administered 2022-02-19: 650 mg via ORAL
  Filled 2022-02-19: qty 2

## 2022-02-19 MED ORDER — IBUPROFEN 800 MG PO TABS
800.0000 mg | ORAL_TABLET | Freq: Once | ORAL | Status: AC
  Administered 2022-02-19: 800 mg via ORAL
  Filled 2022-02-19: qty 1

## 2022-02-19 MED ORDER — NIRMATRELVIR/RITONAVIR (PAXLOVID)TABLET: 3.0000 | ORAL_TABLET | Freq: Two times a day (BID) | ORAL | 0 refills | Status: AC

## 2022-02-19 NOTE — ED Triage Notes (Signed)
Pt c/o fatigue, headache, body ache and fever since yesterday evening. Pt last took tylenol '500mg'$  at 9pm last night.

## 2022-02-19 NOTE — ED Provider Notes (Signed)
[facility] EMERGENCY DEPARTMENT Provider Note   CSN: [id number] Arrival date & time: 02/19/22  9509     History  Chief Complaint  Patient presents with   Fever    JOHNANTHONY WILDEN is a 71 y.o. male.  Patient is a 71 year old male with past medical history of hypertension presenting for complaints of fever.  Patient admits to fever, fatigue, and body aches that started last night.  States he does not normally like to take medications but he took Tylenol 500 mg at 9 PM.  States he still had a hard time sleeping due to body aches.  No recorded temperatures at this time.  Patient states symptoms are still present this morning so he came to the emergency department.  Denies any current headache, nasal congestion, sinus pressure, throat pain, ear pain.  Denies chest pain, shortness of breath, coughing.  Denies abdominal pain, nausea, vomiting, diarrhea.  Denies sick contacts.  The history is provided by the patient. No language interpreter was used.  Fever Associated symptoms: myalgias   Associated symptoms: no chest pain, no chills, no cough, no dysuria, no ear pain, no rash, no sore throat and no vomiting        Home Medications Prior to Admission medications   Medication Sig Start Date End Date Taking? Authorizing Provider  nirmatrelvir/ritonavir EUA (PAXLOVID) 20 x 150 MG & 10 x '100MG'$  TABS Take 3 tablets by mouth 2 (two) times daily for 5 days. Patient GFR is 60. Take nirmatrelvir (150 mg) two tablets twice daily for 5 days and ritonavir (100 mg) one tablet twice daily for 5 days. 02/19/22 32/67/12 Yes Campbell Stall P, DO  aspirin EC 81 MG tablet Take 81 mg by mouth in the morning.    [provider]  cetirizine (ZYRTEC) 10 MG tablet Take 10 mg by mouth in the morning. 09/30/19   [provider]  lisinopril (ZESTRIL) 5 MG tablet TAKE ONE TABLET BY MOUTH ONCE DAILY 09/13/21   Arnoldo Lenis, MD  metoprolol succinate (TOPROL-XL) 25 MG 24 hr tablet TAKE ONE TABLET BY  MOUTH DAILY *NEED OFFICE VISIT FOR FURTHER REFILLS* 01/27/22   Arnoldo Lenis, MD  Multiple Vitamin (MULTIVITAMIN WITH MINERALS) TABS tablet Take 1 tablet by mouth in the morning.    [provider]  pantoprazole (PROTONIX) 40 MG tablet Take 40 mg by mouth in the morning.    [provider]  rosuvastatin (CRESTOR) 40 MG tablet Take 1 tablet (40 mg total) by mouth at bedtime. 01/20/22   Serafina Mitchell, MD  traMADol (ULTRAM) 50 MG tablet Take 1 tablet (50 mg total) by mouth every 6 (six) hours as needed for moderate pain. Patient not taking: Reported on 02/09/2022 01/20/22   Karoline Caldwell, PA-C      Allergies    Oxycodone    Review of Systems   Review of Systems  Constitutional:  Positive for fever. Negative for chills.  HENT:  Negative for ear pain and sore throat.   Eyes:  Negative for pain and visual disturbance.  Respiratory:  Negative for cough and shortness of breath.   Cardiovascular:  Negative for chest pain and palpitations.  Gastrointestinal:  Negative for abdominal pain and vomiting.  Genitourinary:  Negative for dysuria and hematuria.  Musculoskeletal:  Positive for myalgias. Negative for arthralgias and back pain.  Skin:  Negative for color change and rash.  Neurological:  Negative for seizures and syncope.  All other systems reviewed and are negative.   Physical  Exam Updated Vital Signs BP 104/63   Pulse 73   Temp 99.8 F (37.7 C) (Oral)   Resp 19   Ht '5\' 9"'$  (1.753 m)   Wt 78.5 kg   SpO2 96%   BMI 25.55 kg/m  Physical Exam Vitals and nursing note reviewed.  Constitutional:      General: He is not in acute distress.    Appearance: He is well-developed.  HENT:     Head: Normocephalic and atraumatic.  Eyes:     Conjunctiva/sclera: Conjunctivae normal.  Cardiovascular:     Rate and Rhythm: Normal rate and regular rhythm.     Heart sounds: No murmur heard. Pulmonary:     Effort: Pulmonary effort is normal. No respiratory distress.      Breath sounds: Normal breath sounds.  Abdominal:     Palpations: Abdomen is soft.     Tenderness: There is no abdominal tenderness.  Musculoskeletal:        General: No swelling.     Cervical back: Normal range of motion and neck supple. No rigidity or tenderness.  Lymphadenopathy:     Cervical: No cervical adenopathy.  Skin:    General: Skin is warm and dry.     Capillary Refill: Capillary refill takes less than 2 seconds.  Neurological:     Mental Status: He is alert and oriented to person, place, and time.     GCS: GCS eye subscore is 4. GCS verbal subscore is 5. GCS motor subscore is 6.  Psychiatric:        Mood and Affect: Mood normal.     ED Results / Procedures / Treatments   Labs (all labs ordered are listed, but only abnormal results are displayed) Labs Reviewed  RESP PANEL BY RT-PCR (FLU A&B, COVID) ARPGX2 - Abnormal; Notable for the following components:      Result Value   SARS Coronavirus 2 by RT PCR POSITIVE (*)    All other components within normal limits    EKG None  Radiology No results found.  Procedures Procedures    Medications Ordered in ED Medications  ibuprofen (ADVIL) tablet 800 mg (800 mg Oral Given 02/19/22 0803)  acetaminophen (TYLENOL) tablet 650 mg (650 mg Oral Given 02/19/22 0803)    ED Course/ Medical Decision Making/ A&P                           Medical Decision Making Risk OTC drugs. Prescription drug management.   33:73 AM 71 year old male with past medical history of hypertension presenting for complaints of fever.   Patient is alert and oriented x3, no acute distress, febrile at 100.4 F, with otherwise stable vital signs.  On exam patient is well-appearing and nontoxic.  Physical exam demonstrates no rashes, no neck rigidity or stiffness.   Patient did not take any medications prior to arrival today.  Motrin and Tylenol given for fever.  Patient positive for covid 19 virus.  No hypoxia or signs of respiratory distress.   Paxlovid sent to pharmacy.  Patient recommended for rest, quarantine, Motrin or Tylenol for fevers, and increase oral hydration.  Patient in no distress and overall condition improved here in the ED. Detailed discussions were had with the patient regarding current findings, and need for close f/u with PCP or on call doctor. The patient has been instructed to return immediately if the symptoms worsen in any way for re-evaluation. Patient verbalized understanding and is in agreement with current  care plan. All questions answered prior to discharge.          Final Clinical Impression(s) / ED Diagnoses Final diagnoses:  COVID    Rx / DC Orders ED Discharge Orders          Ordered    nirmatrelvir/ritonavir EUA (PAXLOVID) 20 x 150 MG & 10 x '100MG'$  TABS  2 times daily        02/19/22 1013              Lianne Cure, DO 09/81/19 1014

## 2022-02-19 NOTE — Discharge Instructions (Signed)
Today you were diagnosed with COVID-19 virus.  Rest, quarantine for 10 days, Motrin or Tylenol for fevers, and increase oral hydration.  An antiviral medication called Paxlovid was sent to your pharmacy.  You are safe to take 600 mg of Motrin every 6 hours as needed for fevers or body aches. You are also safe to take 650 mg of Tylenol every 6 hours as needed for fevers and body aches.

## 2022-03-14 ENCOUNTER — Ambulatory Visit (HOSPITAL_COMMUNITY): Admit: 2022-03-14 | Payer: Medicare Other | Admitting: Gastroenterology

## 2022-03-14 ENCOUNTER — Encounter (HOSPITAL_COMMUNITY): Payer: Self-pay

## 2022-03-14 SURGERY — UPPER ENDOSCOPIC ULTRASOUND (EUS) RADIAL
Anesthesia: Monitor Anesthesia Care

## 2022-03-30 DIAGNOSIS — Z23 Encounter for immunization: Secondary | ICD-10-CM | POA: Diagnosis not present

## 2022-04-22 ENCOUNTER — Other Ambulatory Visit: Payer: Self-pay | Admitting: Surgery

## 2022-06-24 DIAGNOSIS — E7849 Other hyperlipidemia: Secondary | ICD-10-CM | POA: Diagnosis not present

## 2022-06-24 DIAGNOSIS — E039 Hypothyroidism, unspecified: Secondary | ICD-10-CM | POA: Diagnosis not present

## 2022-06-24 DIAGNOSIS — R944 Abnormal results of kidney function studies: Secondary | ICD-10-CM | POA: Diagnosis not present

## 2022-06-24 DIAGNOSIS — I350 Nonrheumatic aortic (valve) stenosis: Secondary | ICD-10-CM | POA: Diagnosis not present

## 2022-06-24 DIAGNOSIS — E1165 Type 2 diabetes mellitus with hyperglycemia: Secondary | ICD-10-CM | POA: Diagnosis not present

## 2022-06-24 DIAGNOSIS — K2100 Gastro-esophageal reflux disease with esophagitis, without bleeding: Secondary | ICD-10-CM | POA: Diagnosis not present

## 2022-06-24 DIAGNOSIS — I10 Essential (primary) hypertension: Secondary | ICD-10-CM | POA: Diagnosis not present

## 2022-06-24 DIAGNOSIS — E876 Hypokalemia: Secondary | ICD-10-CM | POA: Diagnosis not present

## 2022-06-29 DIAGNOSIS — K2100 Gastro-esophageal reflux disease with esophagitis, without bleeding: Secondary | ICD-10-CM | POA: Diagnosis not present

## 2022-06-29 DIAGNOSIS — I10 Essential (primary) hypertension: Secondary | ICD-10-CM | POA: Diagnosis not present

## 2022-06-29 DIAGNOSIS — Z0001 Encounter for general adult medical examination with abnormal findings: Secondary | ICD-10-CM | POA: Diagnosis not present

## 2022-06-29 DIAGNOSIS — E7849 Other hyperlipidemia: Secondary | ICD-10-CM | POA: Diagnosis not present

## 2022-06-29 DIAGNOSIS — Z23 Encounter for immunization: Secondary | ICD-10-CM | POA: Diagnosis not present

## 2022-06-29 DIAGNOSIS — N1831 Chronic kidney disease, stage 3a: Secondary | ICD-10-CM | POA: Diagnosis not present

## 2022-06-29 DIAGNOSIS — D1721 Benign lipomatous neoplasm of skin and subcutaneous tissue of right arm: Secondary | ICD-10-CM | POA: Diagnosis not present

## 2022-07-06 DIAGNOSIS — R5383 Other fatigue: Secondary | ICD-10-CM | POA: Diagnosis not present

## 2022-07-06 DIAGNOSIS — E1165 Type 2 diabetes mellitus with hyperglycemia: Secondary | ICD-10-CM | POA: Diagnosis not present

## 2022-07-06 DIAGNOSIS — N1831 Chronic kidney disease, stage 3a: Secondary | ICD-10-CM | POA: Diagnosis not present

## 2022-07-06 DIAGNOSIS — I10 Essential (primary) hypertension: Secondary | ICD-10-CM | POA: Diagnosis not present

## 2022-07-20 DIAGNOSIS — I10 Essential (primary) hypertension: Secondary | ICD-10-CM | POA: Diagnosis not present

## 2022-09-24 ENCOUNTER — Other Ambulatory Visit: Payer: Self-pay | Admitting: Vascular Surgery

## 2022-12-28 DIAGNOSIS — E1165 Type 2 diabetes mellitus with hyperglycemia: Secondary | ICD-10-CM | POA: Diagnosis not present

## 2022-12-28 DIAGNOSIS — E876 Hypokalemia: Secondary | ICD-10-CM | POA: Diagnosis not present

## 2022-12-28 DIAGNOSIS — E7849 Other hyperlipidemia: Secondary | ICD-10-CM | POA: Diagnosis not present

## 2023-01-04 DIAGNOSIS — N1831 Chronic kidney disease, stage 3a: Secondary | ICD-10-CM | POA: Diagnosis not present

## 2023-01-04 DIAGNOSIS — D1721 Benign lipomatous neoplasm of skin and subcutaneous tissue of right arm: Secondary | ICD-10-CM | POA: Diagnosis not present

## 2023-01-04 DIAGNOSIS — I10 Essential (primary) hypertension: Secondary | ICD-10-CM | POA: Diagnosis not present

## 2023-01-04 DIAGNOSIS — Z23 Encounter for immunization: Secondary | ICD-10-CM | POA: Diagnosis not present

## 2023-01-04 DIAGNOSIS — K2100 Gastro-esophageal reflux disease with esophagitis, without bleeding: Secondary | ICD-10-CM | POA: Diagnosis not present

## 2023-01-04 DIAGNOSIS — E7849 Other hyperlipidemia: Secondary | ICD-10-CM | POA: Diagnosis not present

## 2023-02-16 DIAGNOSIS — I781 Nevus, non-neoplastic: Secondary | ICD-10-CM | POA: Diagnosis not present

## 2023-02-16 DIAGNOSIS — L570 Actinic keratosis: Secondary | ICD-10-CM | POA: Diagnosis not present

## 2023-02-16 DIAGNOSIS — D485 Neoplasm of uncertain behavior of skin: Secondary | ICD-10-CM | POA: Diagnosis not present

## 2023-02-24 ENCOUNTER — Other Ambulatory Visit: Payer: Self-pay | Admitting: Cardiology

## 2023-03-03 ENCOUNTER — Telehealth: Payer: Self-pay | Admitting: Cardiology

## 2023-03-03 MED ORDER — METOPROLOL SUCCINATE ER 25 MG PO TB24: 25.0000 mg | ORAL_TABLET | Freq: Every day | ORAL | 0 refills | Status: DC

## 2023-03-03 NOTE — Telephone Encounter (Signed)
°*  STAT* If patient is at the pharmacy, call can be transferred to refill team.   1. Which medications need to be refilled? (please list name of each medication and dose if known) metoprolol succinate (TOPROL-XL) 25 MG 24 hr tablet  2. Which pharmacy/location (including street and city if local pharmacy) is medication to be sent to? [facility] [address]  3. Do they need a 30 day or 90 day supply? 90 day

## 2023-03-03 NOTE — Telephone Encounter (Signed)
Filled until patient upcoming appointment

## 2023-03-27 ENCOUNTER — Telehealth: Payer: Self-pay | Admitting: Cardiology

## 2023-03-27 MED ORDER — METOPROLOL SUCCINATE ER 25 MG PO TB24: 25.0000 mg | ORAL_TABLET | Freq: Every day | ORAL | 5 refills | Status: DC

## 2023-03-27 NOTE — Telephone Encounter (Signed)
*  STAT* If patient is at the pharmacy, call can be transferred to refill team.   1. Which medications need to be refilled? (please list name of each medication and dose if known)   Metoprolol    2 4. Which pharmacy/location (including street and city if local pharmacy) is medication to be sent to?   [facility] [place]   5. Do they need a 30 day or 90 day supply?   30  Has apt to see Dr. Wyline Mood next Monday 04/03/23

## 2023-03-27 NOTE — Telephone Encounter (Signed)
Refilled sent.

## 2023-03-30 ENCOUNTER — Other Ambulatory Visit: Payer: Self-pay | Admitting: Cardiology

## 2023-04-01 IMAGING — US US CAROTID DUPLEX BILAT
1 series · 13 of 24 positions shown · non-contrast
Comparison: None.

CLINICAL DATA: Right-sided carotid bruit. History of hypertension,
hyperlipidemia and CAD (post CABG).

EXAM:
BILATERAL CAROTID DUPLEX ULTRASOUND
TECHNIQUE: Gray scale imaging, color Doppler and duplex ultrasound were
performed of bilateral carotid and vertebral arteries in the neck.

[Series 1: us carotid bilateral · 13 of 74 slices shown]
[im 1/74]
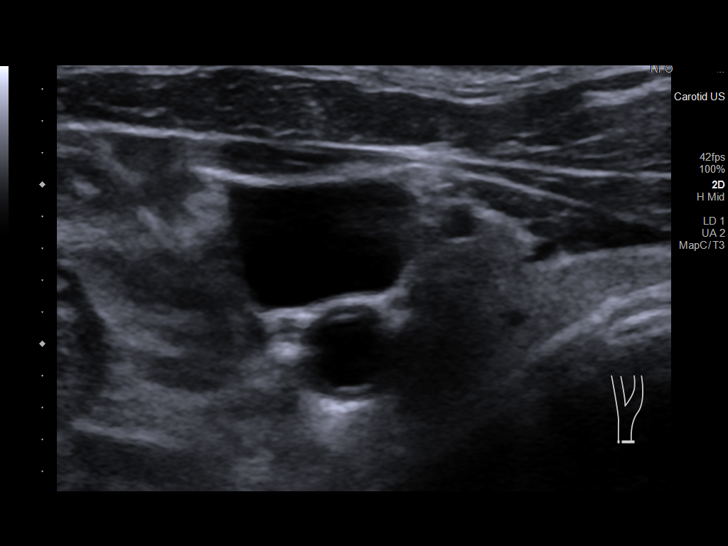
[im 7/74]
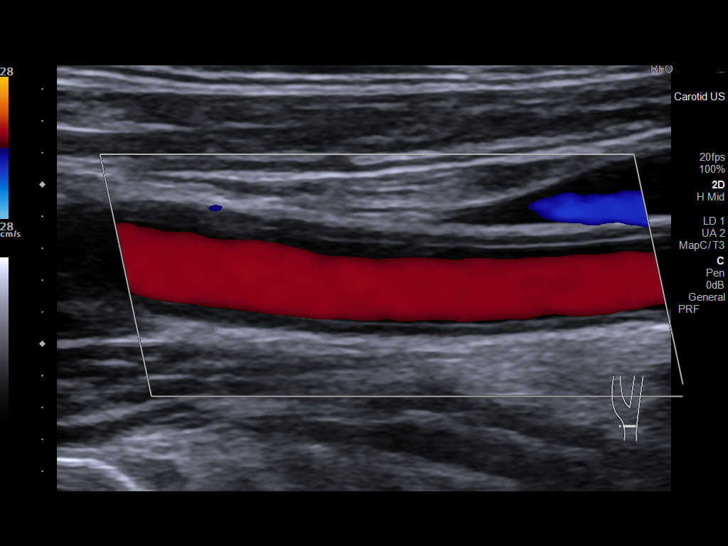
[im 13/74]
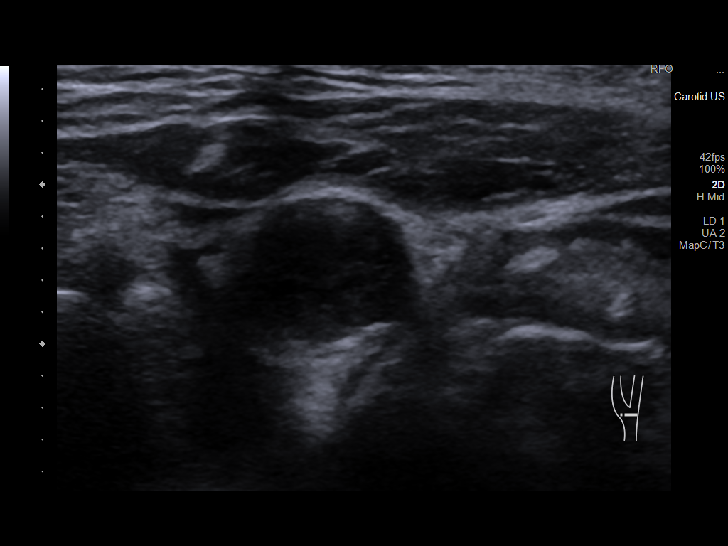
[im 20/74]
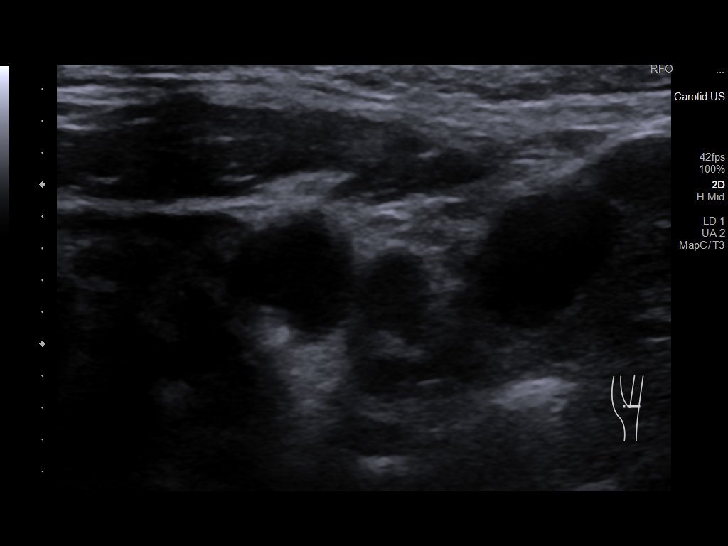
[im 26/74]
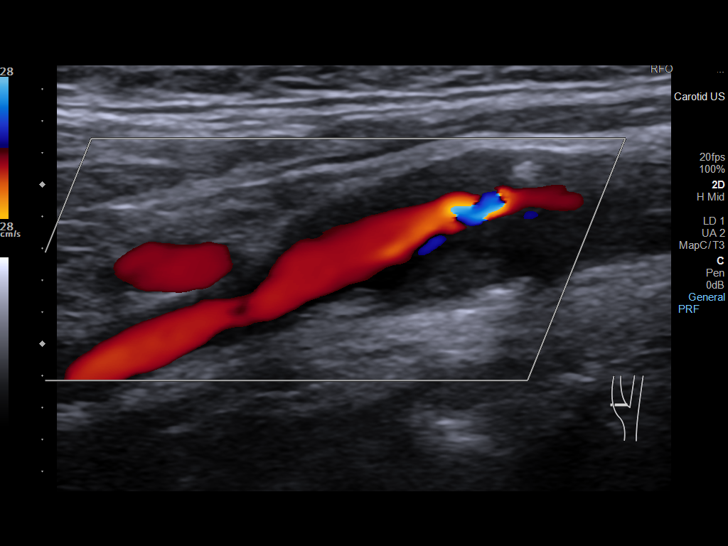
[im 32/74]
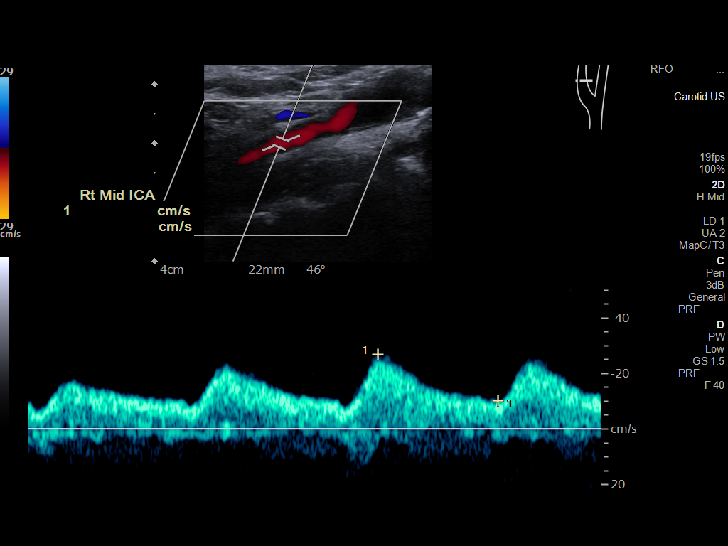
[im 39/74]
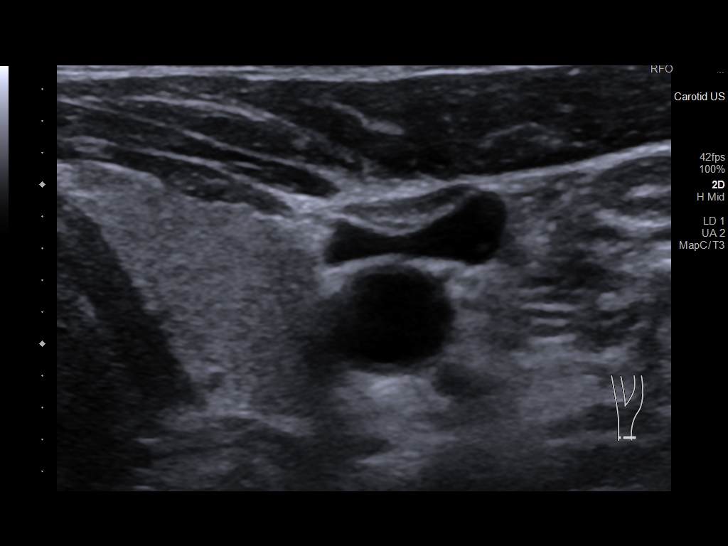
[im 42/74]
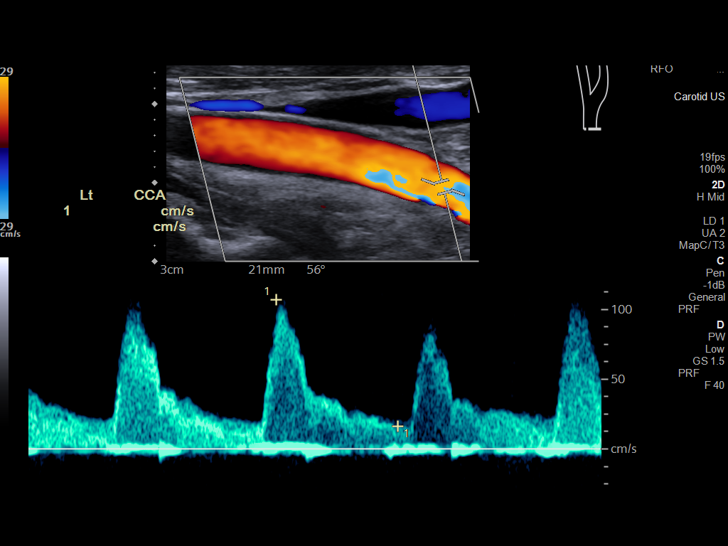
[im 48/74]
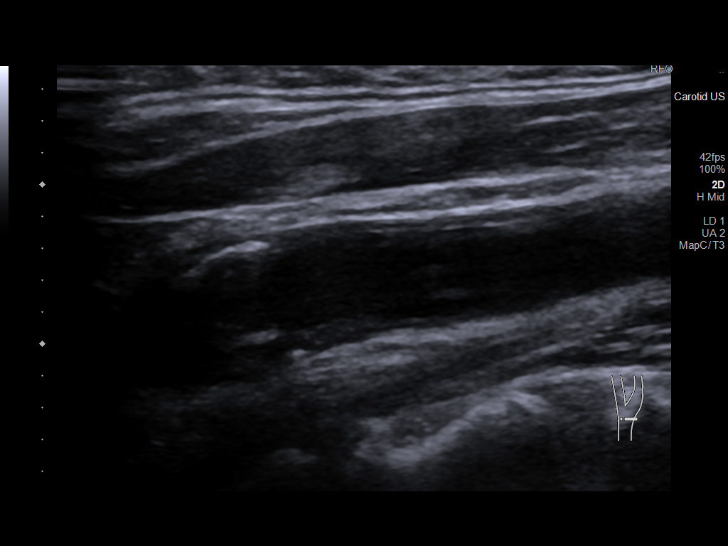
[im 54/74]
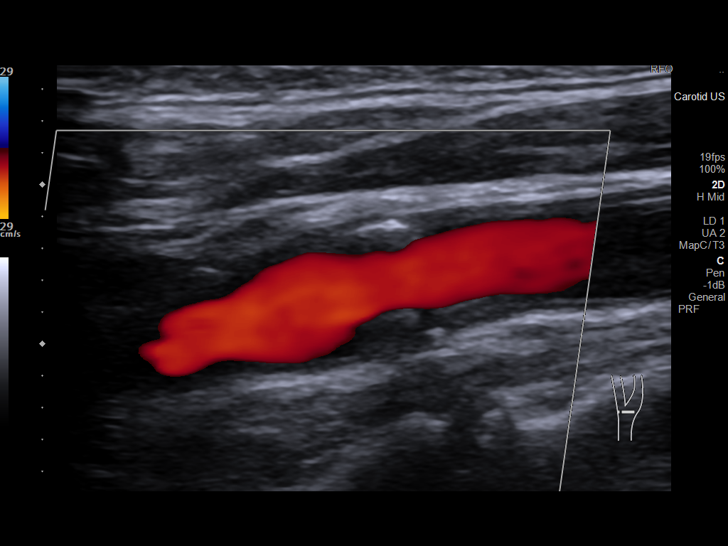
[im 61/74]
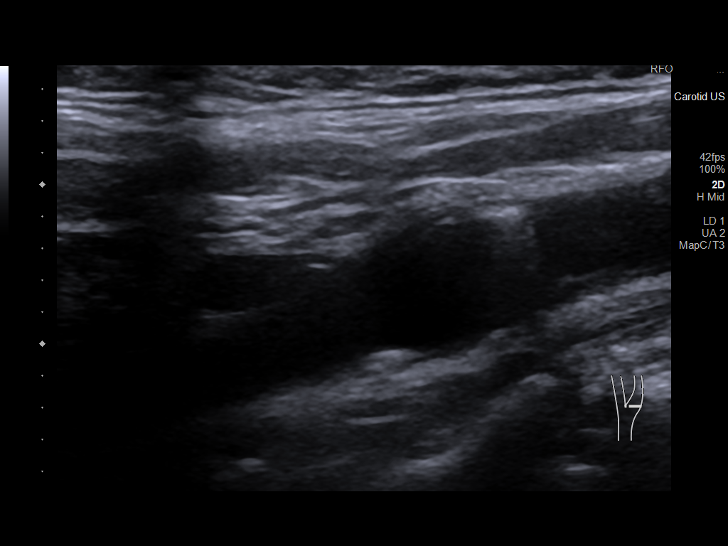
[im 67/74]
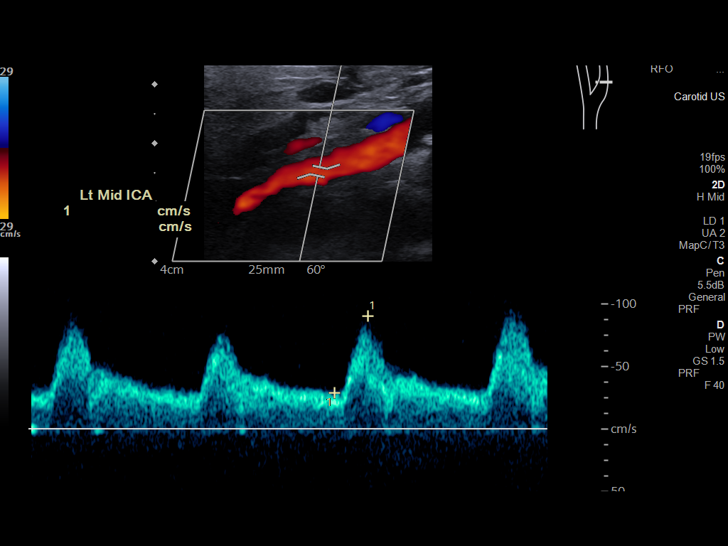
[im 74/74]
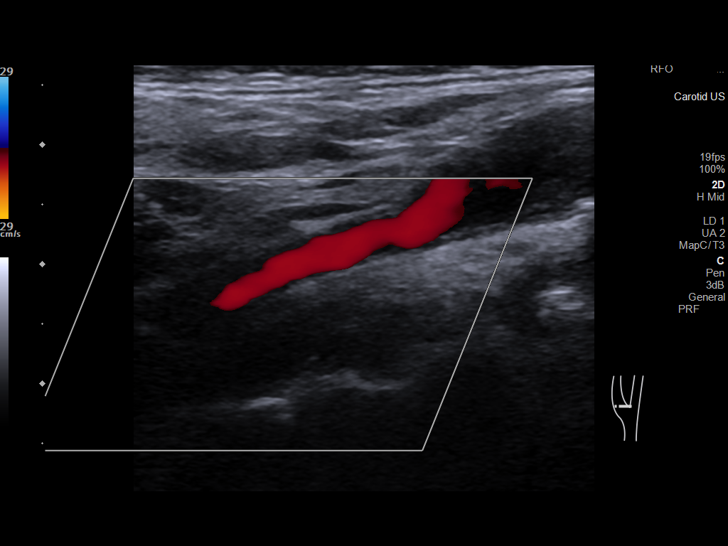

[13 of 24 positions shown; findings below may reference images not displayed]

FINDINGS: Criteria: Quantification of carotid stenosis is based on velocity
parameters that correlate the residual internal carotid diameter
with NASCET-based stenosis levels, using the diameter of the distal
internal carotid lumen as the denominator for stenosis measurement.

The following velocity measurements were obtained:

RIGHT

ICA: 241/50 cm/sec

CCA: 40/15 cm/sec

SYSTOLIC ICA/CCA RATIO:

ECA: 175 cm/sec

LEFT

ICA: 131/32 cm/sec

CCA: 95/25 cm/sec

SYSTOLIC ICA/CCA RATIO:

ECA: 113 cm/sec

RIGHT CAROTID ARTERY: There is a large amount eccentric mixed
echogenic though predominantly hypoechoic plaque within the right
carotid bulb (image 12 and 17), which results in a sonographic
string sign and markedly elevated peak systolic velocities within
both the carotid bulb and right internal carotid artery. Greatest
acquired peak systolic velocity within the right carotid bulb
elevated peak systolic velocity within the proximal right ICA
measures 241 centimeters/second (image 29).

RIGHT VERTEBRAL ARTERY:  Antegrade Flow

LEFT CAROTID ARTERY: There is a moderate amount of eccentric
echogenic plaque within left carotid bulb (image 54, extending to
involve the origin and proximal aspects of the left internal carotid
artery (image 62), which results in elevated peak systolic
velocities within the proximal aspect the left internal carotid
artery. Greatest acquired peak systolic velocity within the proximal
left ICA measures 131 centimeters/second (image 64).

LEFT VERTEBRAL ARTERY:  Antegrade flow
IMPRESSION: 1. Large amount of right-sided atherosclerotic plaque results in a
sonographic string sign, subtotal occlusion and markedly elevated
peak systolic velocities both within the right carotid bulb and the
proximal aspect of the right internal carotid artery. Further
evaluation with CTA could be performed as clinically indicated.
2. Moderate amount of left-sided atherosclerotic plaque results in
elevated peak systolic velocities within the left internal carotid
artery compatible with the 50-69% luminal narrowing range, though
note, velocity measurements are unreliable in the setting of a
contralateral subtotal occlusion. Again, this could be further
evaluated with CTA as indicated.
3. Antegrade flow demonstrated within the bilateral vertebral
arteries.

## 2023-04-03 ENCOUNTER — Encounter: Payer: Self-pay | Admitting: Cardiology

## 2023-04-03 ENCOUNTER — Ambulatory Visit: Payer: 59 | Attending: Cardiology | Admitting: Cardiology

## 2023-04-03 VITALS — BP 116/78 | HR 91 | Ht 68.0 in | Wt 174.4 lb

## 2023-04-03 DIAGNOSIS — I350 Nonrheumatic aortic (valve) stenosis: Secondary | ICD-10-CM

## 2023-04-03 DIAGNOSIS — I6523 Occlusion and stenosis of bilateral carotid arteries: Secondary | ICD-10-CM

## 2023-04-03 DIAGNOSIS — Z952 Presence of prosthetic heart valve: Secondary | ICD-10-CM

## 2023-04-03 DIAGNOSIS — I10 Essential (primary) hypertension: Secondary | ICD-10-CM | POA: Diagnosis not present

## 2023-04-03 DIAGNOSIS — E782 Mixed hyperlipidemia: Secondary | ICD-10-CM

## 2023-04-03 MED ORDER — LISINOPRIL 5 MG PO TABS: 5.0000 mg | ORAL_TABLET | Freq: Every day | ORAL | 3 refills | Status: DC

## 2023-04-03 MED ORDER — METOPROLOL SUCCINATE ER 25 MG PO TB24: 25.0000 mg | ORAL_TABLET | Freq: Every day | ORAL | 3 refills | Status: DC

## 2023-04-03 MED ORDER — ROSUVASTATIN CALCIUM 40 MG PO TABS: 40.0000 mg | ORAL_TABLET | Freq: Every day | ORAL | 3 refills | Status: DC

## 2023-04-03 NOTE — Patient Instructions (Addendum)
Medication Instructions:   Lisinopril, Toprol, & Crestor refilled today  Continue all other medications.     Labwork:  none  Testing/Procedures:  Your physician has requested that you have a carotid duplex. This test is an ultrasound of the carotid arteries in your neck. It looks at blood flow through these arteries that supply the brain with blood. Allow one hour for this exam. There are no restrictions or special instructions.  Your physician has requested that you have an echocardiogram. Echocardiography is a painless test that uses sound waves to create images of your heart. It provides your doctor with information about the size and shape of your heart and how well your heart's chambers and valves are working. This procedure takes approximately one hour. There are no restrictions for this procedure. Please do NOT wear cologne, perfume, aftershave, or lotions (deodorant is allowed). Please arrive 15 minutes prior to your appointment time.  Please note: We ask at that you not bring children with you during ultrasound (echo/ vascular) testing. Due to room size and safety concerns, children are not allowed in the ultrasound rooms during exams. Our front office staff cannot provide observation of children in our lobby area while testing is being conducted. An adult accompanying a patient to their appointment will only be allowed in the ultrasound room at the discretion of the ultrasound technician under special circumstances. We apologize for any inconvenience.  Office will contact with results via phone, letter or mychart.     Follow-Up:  Your physician wants you to follow up in:  1 year.  You should receive a recall letter in the mail about 2 months prior to the time you are due.  If you don't receive this, please call our office to schedule your follow up appointment.      Any Other Special Instructions Will Be Listed Below (If Applicable).   If you need a refill on your cardiac  medications before your next appointment, please call your pharmacy.

## 2023-04-03 NOTE — Progress Notes (Signed)
Clinical Summary Mr. Stepper is a 72 y.o.male seen today for follow up of the following medical problems.    1. Aortic stenosis/Bicuspid aortic valve - probable bicuspid AV by prior echo. - echo 01/2010 LVEF 60-65%, bicuspid AV mod AS mean grad 34 and AVA VTI 1.12 03/2017 echo: LVEF 35-40%, grade I diastolic dysfunction, AV mean grad 51, AVA VTI 0.39. 03/2017 cath: mild nonsobstructive CAD. Normal filling pressures by [facility]HC 03/2017 CTA Chest/Abd/Pelvis: no aortic pathology   - s/p surgical AVR with 23 mm [facility] Ease pericardial valve Jan 2019 - f/u echo 07/2017 shows LVEF 60-65%, grade I diastolic dysfunction, normal functioning AVR.    - no SOB/DOE, no LE edema, no chest pains.          2. HTN - he is compliant with meds   3. Hyperlipidemia -- he is on crestor   01/2022 TC 160 TG 169 HDL 44 LDL 83 - labs followed by pcp   4. 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 She just had her surgery Just bought a fishing boat [name] moved to [employer] for her job. Going to get a PhD. Past Medical History:  Diagnosis Date   Aortic stenosis    a. 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 05/2017: TEE showing EF 40-45%   Dyspnea    GERD (gastroesophageal reflux disease)    Hypertension      Allergies  Allergen Reactions   Oxycodone Other (See Comments)    "Makes me go crazy"     Current Outpatient Medications  Medication Sig Dispense Refill   aspirin EC 81 MG tablet Take 81 mg by mouth in the morning.     cetirizine (ZYRTEC) 10 MG tablet Take 10 mg by mouth in the morning.      lisinopril (ZESTRIL) 5 MG tablet TAKE ONE TABLET BY MOUTH ONCE DAILY 90 tablet 3   metoprolol succinate (TOPROL-XL) 25 MG 24 hr tablet Take 1 tablet (25 mg total) by mouth daily. 30 tablet 5   Multiple Vitamin (MULTIVITAMIN WITH MINERALS) TABS tablet Take 1 tablet by mouth in the morning.     pantoprazole (PROTONIX) 40 MG tablet Take 40 mg by mouth in the morning.     rosuvastatin (CRESTOR) 40 MG tablet TAKE ONE TABLET BY MOUTH AT BEDTIME 30 tablet 0   traMADol (ULTRAM) 50 MG tablet Take 1 tablet (50 mg total) by mouth every 6 (six) hours as needed for moderate pain. (Patient not taking: Reported on 02/09/2022) 16 tablet 0   No current facility-administered medications for this visit.     Past Surgical History:  Procedure Laterality Date   AORTIC VALVE REPLACEMENT N/A 05/18/2017   Procedure: AORTIC VALVE REPLACEMENT (AVR),TEE;  Surgeon: Alleen Borne, MD;  Location: [facility] OR;  Service: Open Heart Surgery;  Laterality: N/A;   BIOPSY  08/27/2021   Procedure: BIOPSY;  Surgeon: Dolores Frame, MD;  Location: [facility] SUITE;  Service: Gastroenterology;;   COLONOSCOPY WITH PROPOFOL N/A 09/08/2020   Procedure: COLONOSCOPY WITH PROPOFOL;  Surgeon: Dolores Frame,  MD;  Location: [facility];  Service: Gastroenterology;  Laterality: N/A;  11:15 AM   ENDARTERECTOMY Right 01/19/2022   Procedure: RIGHT ENDARTERECTOMY CAROTID;  Surgeon: Nada Libman, MD;  Location: [facility] OR;  Service: Vascular;  Laterality: Right;   ESOPHAGEAL DILATION N/A 08/27/2021   Procedure: ESOPHAGEAL DILATION;  Surgeon: Dolores Frame, MD;  Location: [facility];  Service: Gastroenterology;  Laterality: N/A;   ESOPHAGOGASTRODUODENOSCOPY (EGD) WITH PROPOFOL N/A 08/27/2021   Procedure: ESOPHAGOGASTRODUODENOSCOPY (EGD) WITH PROPOFOL;  Surgeon: Dolores Frame, MD;  Location: [facility];  Service: Gastroenterology;  Laterality: N/A;  1215   HAND SURGERY     left hang following trauma   PATCH  ANGIOPLASTY Right 01/19/2022   Procedure: PATCH ANGIOPLASTY OF RIGHT CAROTID ARTERY USING XENOSURE BOVINE BIOLOGIC PATCH;  Surgeon: Nada Libman, MD;  Location: [facility] OR;  Service: Vascular;  Laterality: Right;   RIGHT/LEFT HEART CATH AND CORONARY ANGIOGRAPHY N/A 04/12/2017   Procedure: RIGHT/LEFT HEART CATH AND CORONARY ANGIOGRAPHY;  Surgeon: Kathleene Hazel, MD;  Location: [facility] INVASIVE CV LAB;  Service: Cardiovascular;  Laterality: N/A;   TEE WITHOUT CARDIOVERSION N/A 05/18/2017   Procedure: TRANSESOPHAGEAL ECHOCARDIOGRAM (TEE);  Surgeon: Alleen Borne, MD;  Location: [facility] OR;  Service: Open Heart Surgery;  Laterality: N/A;     Allergies  Allergen Reactions   Oxycodone Other (See Comments)    "Makes me go crazy"      Family History  Problem Relation Age of Onset   Lung cancer Other        brother and father   Heart murmur Other        mother   Cancer Mother    Heart failure Mother    Cancer Father    Cancer Brother    Cirrhosis Brother    Alcohol abuse Brother      Social History Mr. Mcaulay reports that he has quit smoking. His smoking use included cigarettes. He has a 10 pack-year smoking history. He has been exposed to tobacco smoke. He has quit using smokeless tobacco.  His smokeless tobacco use included snuff. Mr. Fessler reports no history of alcohol use.   Review of Systems CONSTITUTIONAL: No weight loss, fever, chills, weakness or fatigue.  HEENT: Eyes: No visual loss, blurred vision, double vision or yellow sclerae.No hearing loss, sneezing, congestion, runny nose or sore throat.  SKIN: No rash or itching.  CARDIOVASCULAR:per hpi RESPIRATORY: No shortness of breath, cough or sputum.  GASTROINTESTINAL: No anorexia, nausea, vomiting or diarrhea. No abdominal pain or blood.  GENITOURINARY: No burning on urination, no polyuria NEUROLOGICAL: No headache, dizziness, syncope, paralysis, ataxia, numbness or tingling in the extremities. No change in bowel or bladder  control.  MUSCULOSKELETAL: No muscle, back pain, joint pain or stiffness.  LYMPHATICS: No enlarged nodes. No history of splenectomy.  PSYCHIATRIC: No history of depression or anxiety.  ENDOCRINOLOGIC: No reports of sweating, cold or heat intolerance. No polyuria or polydipsia.  Marland Kitchen   Physical Examination Today's Vitals   04/03/23 1358  BP: 116/78  Pulse: 91  SpO2: 97%  Weight: 174 lb 6.4 oz (79.1 kg)  Height: 5\' 8"  (1.727 m)   Body mass index is 26.52 kg/m.  Gen: resting comfortably, no acute distress HEENT: no scleral icterus, pupils equal round and reactive, no palptable cervical adenopathy,  CV: RRR, 2/6 systoilc murmur rusb, no jvd Resp: Clear to auscultation bilaterally GI: abdomen is soft, non-tender, non-distended, normal bowel sounds, no hepatosplenomegaly MSK: extremities are warm, no edema.  Skin: warm, no rash Neuro:  no focal deficits Psych: appropriate affect   Diagnostic Studies  03/2017 echo Study Conclusions   - Left ventricle: The cavity size was normal. Wall thickness was   increased in a pattern of moderate LVH. Systolic function was   moderately reduced. The estimated ejection fraction was in the   range of 35% to 40%. Diffuse hypokinesis. Doppler parameters are   consistent with abnormal left ventricular relaxation (grade 1   diastolic dysfunction). Doppler parameters are consistent with   high ventricular filling pressure. - Aortic valve: Severely calcified annulus. Severely thickened   leaflets. There was severe stenosis. Mean gradient (S): 51 mm Hg.   VTI ratio of LVOT to aortic valve: 0.11. Valve area (VTI): 0.39   cm^2. Valve area (Vmax): 0.4 cm^2. Valve area (Vmean): 0.43 cm^2. - Left atrium: The atrium was mildly dilated. - Technically adequate study.   03/2017 Heart cath Prox RCA lesion is 10% stenosed. Ost 2nd Mrg to 2nd Mrg lesion is 10% stenosed. Prox LAD to Mid LAD lesion is 20% stenosed.   1. Mild non-obstructive CAD 2. Severe  aortic stenosis by echo (unable to cross the aortic valve from the radial approach).   Recommendations: He will need referral to CT surgery to discuss surgical AVR. He is low risk for surgery and will not be a candidate for TAVR if his surgical risk is felt to be low.          Assessment and Plan   1. Aortic stenosis/bicuspid aortic valve - s/p surigcal AVR, bioprosthetic - over 5 years since surgery, plan for echo reassessment of valve. If benign would repeat in additional 5 years.  - no symptoms   2. HTN -he is at goal, conitnue current meds   3. Carotid stenosis -s/p right CEA - due for repeat carotid US, will order   4. Hyperlipidemia - continue current meds, request pcp labs  EKG shows NSR   F/u 1 year.     Antoine Poche, M.D

## 2023-04-04 ENCOUNTER — Encounter: Payer: Self-pay | Admitting: *Deleted

## 2023-04-11 DIAGNOSIS — Z23 Encounter for immunization: Secondary | ICD-10-CM | POA: Diagnosis not present

## 2023-04-12 ENCOUNTER — Ambulatory Visit (INDEPENDENT_AMBULATORY_CARE_PROVIDER_SITE_OTHER): Payer: 59

## 2023-04-12 ENCOUNTER — Ambulatory Visit: Payer: 59 | Attending: Cardiology

## 2023-04-12 DIAGNOSIS — I350 Nonrheumatic aortic (valve) stenosis: Secondary | ICD-10-CM

## 2023-04-12 DIAGNOSIS — I6523 Occlusion and stenosis of bilateral carotid arteries: Secondary | ICD-10-CM | POA: Diagnosis not present

## 2023-04-12 LAB — ECHOCARDIOGRAM COMPLETE
AR max vel: 1.99 cm2
AV Area VTI: 1.97 cm2
AV Area mean vel: 2.06 cm2
AV Mean grad: 9.5 mm[Hg]
AV Peak grad: 18.1 mm[Hg]
Ao pk vel: 2.13 m/s
Area-P 1/2: 2.93 cm2
Calc EF: 53.6 %
MV VTI: 3.48 cm2
S' Lateral: 2.3 cm
Single Plane A2C EF: 48.5 %
Single Plane A4C EF: 57.6 %

## 2023-04-21 ENCOUNTER — Telehealth: Payer: Self-pay | Admitting: *Deleted

## 2023-04-21 NOTE — Telephone Encounter (Signed)
-----   Message from Dina Rich sent at 04/20/2023  2:30 PM EST ----- Carotid US shows mild plaque on both sides, we will continue to monitor at this time  Dominga Ferry MD

## 2023-04-21 NOTE — Telephone Encounter (Signed)
Lesle Chris, LPN 30/05/6008 93:23 AM EST Back to Top    Notified, copy to pcp.

## 2023-05-08 ENCOUNTER — Telehealth: Payer: Self-pay | Admitting: *Deleted

## 2023-05-08 NOTE — Telephone Encounter (Signed)
Notified, copy to pcp.

## 2023-05-08 NOTE — Telephone Encounter (Signed)
-----   Message from Dina Rich sent at 05/07/2023  7:38 AM EST ----- Echo shows normal heart pumping function. His artificial heart valve looks good and is working normally.  Dominga Ferry MD

## 2023-07-07 DIAGNOSIS — Z0001 Encounter for general adult medical examination with abnormal findings: Secondary | ICD-10-CM | POA: Diagnosis not present

## 2023-07-07 DIAGNOSIS — E782 Mixed hyperlipidemia: Secondary | ICD-10-CM | POA: Diagnosis not present

## 2023-07-07 DIAGNOSIS — N1831 Chronic kidney disease, stage 3a: Secondary | ICD-10-CM | POA: Diagnosis not present

## 2023-07-07 DIAGNOSIS — I10 Essential (primary) hypertension: Secondary | ICD-10-CM | POA: Diagnosis not present

## 2023-07-07 DIAGNOSIS — Z1329 Encounter for screening for other suspected endocrine disorder: Secondary | ICD-10-CM | POA: Diagnosis not present

## 2023-07-07 DIAGNOSIS — Z1321 Encounter for screening for nutritional disorder: Secondary | ICD-10-CM | POA: Diagnosis not present

## 2023-07-07 DIAGNOSIS — E559 Vitamin D deficiency, unspecified: Secondary | ICD-10-CM | POA: Diagnosis not present

## 2023-07-14 DIAGNOSIS — N1831 Chronic kidney disease, stage 3a: Secondary | ICD-10-CM | POA: Diagnosis not present

## 2023-07-14 DIAGNOSIS — E782 Mixed hyperlipidemia: Secondary | ICD-10-CM | POA: Diagnosis not present

## 2023-07-14 DIAGNOSIS — Z23 Encounter for immunization: Secondary | ICD-10-CM | POA: Diagnosis not present

## 2023-07-14 DIAGNOSIS — Z0001 Encounter for general adult medical examination with abnormal findings: Secondary | ICD-10-CM | POA: Diagnosis not present

## 2023-07-14 DIAGNOSIS — I10 Essential (primary) hypertension: Secondary | ICD-10-CM | POA: Diagnosis not present

## 2023-07-14 DIAGNOSIS — K2100 Gastro-esophageal reflux disease with esophagitis, without bleeding: Secondary | ICD-10-CM | POA: Diagnosis not present

## 2023-09-22 ENCOUNTER — Telehealth: Payer: Self-pay | Admitting: Cardiology

## 2023-09-22 MED ORDER — LISINOPRIL 5 MG PO TABS: 5.0000 mg | ORAL_TABLET | Freq: Every day | ORAL | 1 refills | Status: DC

## 2023-09-22 MED ORDER — ROSUVASTATIN CALCIUM 40 MG PO TABS: 40.0000 mg | ORAL_TABLET | Freq: Every day | ORAL | 1 refills | Status: DC

## 2023-09-22 MED ORDER — METOPROLOL SUCCINATE ER 25 MG PO TB24: 25.0000 mg | ORAL_TABLET | Freq: Every day | ORAL | 1 refills | Status: DC

## 2023-09-22 NOTE — Telephone Encounter (Signed)
*  STAT* If patient is at the pharmacy, call can be transferred to refill team.   1. Which medications need to be refilled? (please list name of each medication and dose if known) rosuvastatin  (CRESTOR ) 40 MG tablet , metoprolol  succinate (TOPROL -XL) 25 MG 24 hr tablet , lisinopril  (ZESTRIL ) 5 MG tablet    2. Would you like to learn more about the convenience, safety, & potential cost savings by using the [facility]? NO   3. Are you open to using the [facility] NO   4. Which pharmacy/location (including street and city if local pharmacy) is medication to be sent to?  [facility] [phone] - [place], [place] - [address] AT [facility] [address]     5. Do they need a 30 day or 90 day supply? 90

## 2023-09-22 NOTE — Telephone Encounter (Signed)
 Pt's medications were sent to pt's pharmacy as requested. Confirmation received.

## 2024-01-12 DIAGNOSIS — I10 Essential (primary) hypertension: Secondary | ICD-10-CM | POA: Diagnosis not present

## 2024-01-12 DIAGNOSIS — E782 Mixed hyperlipidemia: Secondary | ICD-10-CM | POA: Diagnosis not present

## 2024-01-12 DIAGNOSIS — K2100 Gastro-esophageal reflux disease with esophagitis, without bleeding: Secondary | ICD-10-CM | POA: Diagnosis not present

## 2024-01-12 DIAGNOSIS — N1831 Chronic kidney disease, stage 3a: Secondary | ICD-10-CM | POA: Diagnosis not present

## 2024-01-12 DIAGNOSIS — E7849 Other hyperlipidemia: Secondary | ICD-10-CM | POA: Diagnosis not present

## 2024-01-17 ENCOUNTER — Encounter (INDEPENDENT_AMBULATORY_CARE_PROVIDER_SITE_OTHER): Payer: Self-pay | Admitting: *Deleted

## 2024-02-05 ENCOUNTER — Encounter (INDEPENDENT_AMBULATORY_CARE_PROVIDER_SITE_OTHER): Payer: Self-pay | Admitting: Gastroenterology

## 2024-02-05 ENCOUNTER — Telehealth (INDEPENDENT_AMBULATORY_CARE_PROVIDER_SITE_OTHER): Payer: Self-pay

## 2024-02-05 ENCOUNTER — Ambulatory Visit (INDEPENDENT_AMBULATORY_CARE_PROVIDER_SITE_OTHER): Admitting: Gastroenterology

## 2024-02-05 VITALS — BP 124/75 | HR 66 | Temp 98.2°F | Ht 68.0 in | Wt 170.7 lb

## 2024-02-05 DIAGNOSIS — R1310 Dysphagia, unspecified: Secondary | ICD-10-CM

## 2024-02-05 DIAGNOSIS — K219 Gastro-esophageal reflux disease without esophagitis: Secondary | ICD-10-CM | POA: Diagnosis not present

## 2024-02-05 NOTE — Progress Notes (Signed)
 Jeremy Fortune, M.D. Gastroenterology & Hepatology [facility]enter[facility] [address] [place], [place] [address]  Primary Care Physician: Jeremy Jerel MATSU, MD [address] [place] [address]  I will communicate my assessment and recommendations to the referring MD via EMR.  Problems: Recurrent dysphagia GERD Gastric submucosal nodule  History of Present Illness: Jeremy Larson is a 73 y.o. male  with past medical history of aortic stenosis status post aortic valve replacement in 2019, chronic systolic heart failure (most recent ejection fraction 60-65%) ,  who presents for follow up of dysphagia.  The patient was last seen on 08/26/2021. At that time, the patient was scheduled for EGD with findings described below.  Patient was referred for EUS but he canceled this appointment.  Patient reports that after his last dilation, he has felt better in general. States he may have dysphagia 1-2 times a month. He may feel food gets stuck in his neck and may need to spit it out intermittently. He may not have issues with swallowing if he has small bites. Denies any heartburn, although he takes pantoprazole  40 mg x2 at the same time.  The patient denies having any nausea, vomiting, fever, chills, hematochezia, melena, hematemesis, abdominal distention, abdominal pain, diarrhea, jaundice, pruritus or weight loss.  Last EGD: 08/27/2021 Empiric dilation performed with savory dilator up to 18 mm.  Biopsies were negative for EOE.  Single submucosal nodule measuring 10 mm in stomach.  Normal duodenum  Last Colonoscopy: 09/08/2020  - The examined portion of the ileum was normal. - Diverticulosis in the sigmoid colon. - The distal rectum and anal verge are normal on retroflexion view.  Past Medical History: Past Medical History:  Diagnosis Date   Aortic stenosis    a. s/p AVR on 05/18/2017 with 23 mm Edwards Magna-Ease Pericardial valve   Benign lipomatous neoplasm  of skin and subcutaneous tissue of right arm    Chronic systolic (congestive) heart failure (HCC)    a. 05/2017: TEE showing EF 40-45%   Dyspnea    GERD (gastroesophageal reflux disease)    Hypertension     Past Surgical History: Past Surgical History:  Procedure Laterality Date   AORTIC VALVE REPLACEMENT N/A 05/18/2017   Procedure: AORTIC VALVE REPLACEMENT (AVR),TEE;  Surgeon: Lucas Dorise POUR, MD;  [facility]: [facility] OR;  Service: Open Heart Surgery;  Laterality: N/A;   BIOPSY  08/27/2021   Procedure: BIOPSY;  Surgeon: Fortune Angelia Toribio, MD;  [facility];  Service: Gastroenterology;;   COLONOSCOPY WITH PROPOFOL  N/A 09/08/2020   Procedure: COLONOSCOPY WITH PROPOFOL ;  Surgeon: Fortune Angelia Toribio, MD;  [facility];  Service: Gastroenterology;  Laterality: N/A;  11:15 AM   ENDARTERECTOMY Right 01/19/2022   Procedure: RIGHT ENDARTERECTOMY CAROTID;  Surgeon: Serene Gaile ORN, MD;  Location: [facility] OR;  Service: Vascular;  Laterality: Right;   ESOPHAGEAL DILATION N/A 08/27/2021   Procedure: ESOPHAGEAL DILATION;  Surgeon: Fortune Angelia Toribio, MD;  [facility];  Service: Gastroenterology;  Laterality: N/A;   ESOPHAGOGASTRODUODENOSCOPY (EGD) WITH PROPOFOL  N/A 08/27/2021   Procedure: ESOPHAGOGASTRODUODENOSCOPY (EGD) WITH PROPOFOL ;  Surgeon: Fortune Angelia Toribio, MD;  [facility];  Service: Gastroenterology;  Laterality: N/A;  1215   HAND SURGERY     left hang following trauma   PATCH ANGIOPLASTY Right 01/19/2022   Procedure: PATCH ANGIOPLASTY OF RIGHT CAROTID ARTERY USING XENOSURE BOVINE BIOLOGIC PATCH;  Surgeon: Serene Gaile ORN, MD;  Location: [facility] OR;  Service: Vascular;  Laterality:  Right;   RIGHT/LEFT HEART CATH AND CORONARY ANGIOGRAPHY N/A 04/12/2017   Procedure: RIGHT/LEFT HEART CATH AND CORONARY ANGIOGRAPHY;  Surgeon: Verlin Lonni BIRCH, MD;  [facility]: [facility];  Service: Cardiovascular;  Laterality: N/A;   TEE WITHOUT  CARDIOVERSION N/A 05/18/2017   Procedure: TRANSESOPHAGEAL ECHOCARDIOGRAM (TEE);  Surgeon: Lucas Dorise POUR, MD;  Location: [facility] OR;  Service: Open Heart Surgery;  Laterality: N/A;    Family History: Family History  Problem Relation Age of Onset   Lung cancer Other        brother and father   Heart murmur Other        mother   Cancer Mother    Heart failure Mother    Cancer Father    Cancer Brother    Cirrhosis Brother    Alcohol abuse Brother     Social History: Social History   Tobacco Use  Smoking Status Former   Current packs/day: 1.00   Average packs/day: 1 pack/day for 10.0 years (10.0 ttl pk-yrs)   Types: Cigarettes   Passive exposure: Past  Smokeless Tobacco Former   Types: Snuff   Social History   Substance and Sexual Activity  Alcohol Use No   Social History   Substance and Sexual Activity  Drug Use No    Allergies: Allergies  Allergen Reactions   Oxycodone  Other (See Comments)    Makes me go crazy    Medications: Current Outpatient Medications  Medication Sig Dispense Refill   aspirin  EC 81 MG tablet Take 81 mg by mouth in the morning.     cetirizine (ZYRTEC) 10 MG tablet Take 10 mg by mouth in the morning.     lisinopril  (ZESTRIL ) 5 MG tablet Take 1 tablet (5 mg total) by mouth daily. 90 tablet 1   metoprolol  succinate (TOPROL -XL) 25 MG 24 hr tablet Take 1 tablet (25 mg total) by mouth daily. 90 tablet 1   Multiple Vitamin (MULTIVITAMIN WITH MINERALS) TABS tablet Take 1 tablet by mouth in the morning.     pantoprazole  (PROTONIX ) 40 MG tablet Take 40 mg by mouth in the morning. (Patient taking differently: Take 80 mg by mouth in the morning.)     rosuvastatin  (CRESTOR ) 40 MG tablet Take 1 tablet (40 mg total) by mouth at bedtime. 90 tablet 1   No current facility-administered medications for this visit.    Review of Systems: GENERAL: negative for malaise, night sweats HEENT: No changes in hearing or vision, no nose bleeds or other nasal  problems. NECK: Negative for lumps, goiter, pain and significant neck swelling RESPIRATORY: Negative for cough, wheezing CARDIOVASCULAR: Negative for chest pain, leg swelling, palpitations, orthopnea GI: SEE HPI MUSCULOSKELETAL: Negative for joint pain or swelling, back pain, and muscle pain. SKIN: Negative for lesions, rash PSYCH: Negative for sleep disturbance, mood disorder and recent psychosocial stressors. HEMATOLOGY Negative for prolonged bleeding, bruising easily, and swollen nodes. ENDOCRINE: Negative for cold or heat intolerance, polyuria, polydipsia and goiter. NEURO: negative for tremor, gait imbalance, syncope and seizures. The remainder of the review of systems is noncontributory.   Physical Exam: BP 124/75 (BP Location: Left Arm, Patient Position: Sitting, Cuff Size: Normal)   Pulse 66   Temp 98.2 F (36.8 C) (Temporal)   Ht 5' 8 (1.727 m)   Wt 170 lb 11.2 oz (77.4 kg)   BMI 25.95 kg/m  GENERAL: The patient is AO x3, in no acute distress. HEENT: Head is normocephalic and atraumatic. EOMI are intact. Mouth is well hydrated and without  lesions. NECK: Supple. No masses LUNGS: Clear to auscultation. No presence of rhonchi/wheezing/rales. Adequate chest expansion HEART: RRR, normal s1 and s2. ABDOMEN: Soft, nontender, no guarding, no peritoneal signs, and nondistended. BS +. No masses. EXTREMITIES: Without any cyanosis, clubbing, rash, lesions or edema. NEUROLOGIC: AOx3, no focal motor deficit. SKIN: no jaundice, no rashes  Imaging/Labs: as above  I personally reviewed and interpreted the available labs, imaging and endoscopic files.  Impression and Plan: SHRAGA CUSTARD is a 73 y.o. male  with past medical history of aortic stenosis status post aortic valve replacement in 2019, chronic systolic heart failure (most recent ejection fraction 60-65%) ,  who presents for follow up of dysphagia.  Patient has presented some recurrent issues with dysphagia.  He reports  having improvement after he underwent empiric dilation but his symptoms have somewhat recurred.  Due to this, we will proceed with repeat EGD with possible dilation with a larger savory dilator.  He is open to proceed with this.  He has presented improvement of his GERD with a higher dose of PPI.  However, he has not been taking this on an adequate fashion.  We discussed the possibility of continuing pantoprazole  at a lower dose but taking this at least 30 minutes before his breakfast.  He is agreeable to try this for a few weeks.  -Schedule EGD -Decrease pantoprazole  40 mg qday -Explained presumed etiology of reflux symptoms. Instruction provided in the use of antireflux medication - patient should take medication in the morning 30-45 minutes before eating breakfast. Discussed avoidance of eating within 2 hours of lying down to sleep and benefit of blocks to elevate head of bed. Also, will benefit from avoiding carbonated drinks/sodas or food that has tomatoes, spicy or greasy food.  All questions were answered.      Jeremy Fortune, MD Gastroenterology and Hepatology [facility]

## 2024-02-05 NOTE — Patient Instructions (Signed)
 Schedule EGD Decrease pantoprazole  40 mg qday Explained presumed etiology of reflux symptoms. Instruction provided in the use of antireflux medication - patient should take medication in the morning 30-45 minutes before eating breakfast. Discussed avoidance of eating within 2 hours of lying down to sleep and benefit of blocks to elevate head of bed. Also, will benefit from avoiding carbonated drinks/sodas or food that has tomatoes, spicy or greasy food.

## 2024-02-05 NOTE — Telephone Encounter (Signed)
  Notification or Prior Authorization is not required for the requested services You are not required to submit a notification/prior authorization based on the information provided. If you have general questions about the prior authorization requirements, visit [email or web address] > Clinician Resources > Advance and Admission Notification Requirements. The number above acknowledges your notification. Please write this reference number down for future reference. If you would like to request an organization determination, please call us  at [phone]. Decision ID #: [id number] The number above acknowledges your inquiry and our response. Please write this number down and refer to it for future inquiries. Coverage and payment for an item or service is governed by the member's benefit plan document, and, if applicable, the provider's participation agreement with the Health Plan.

## 2024-02-05 NOTE — H&P (View-Only) (Signed)
 Toribio Fortune, M.D. Gastroenterology & Hepatology [facility]enter[facility] [address] [place], [place] [address]  Primary Care Physician: Toribio Jerel MATSU, MD [address] [place] [address]  I will communicate my assessment and recommendations to the referring MD via EMR.  Problems: Recurrent dysphagia GERD Gastric submucosal nodule  History of Present Illness: Jeremy Larson is a 73 y.o. male  with past medical history of aortic stenosis status post aortic valve replacement in 2019, chronic systolic heart failure (most recent ejection fraction 60-65%) ,  who presents for follow up of dysphagia.  The patient was last seen on 08/26/2021. At that time, the patient was scheduled for EGD with findings described below.  Patient was referred for EUS but he canceled this appointment.  Patient reports that after his last dilation, he has felt better in general. States he may have dysphagia 1-2 times a month. He may feel food gets stuck in his neck and may need to spit it out intermittently. He may not have issues with swallowing if he has small bites. Denies any heartburn, although he takes pantoprazole  40 mg x2 at the same time.  The patient denies having any nausea, vomiting, fever, chills, hematochezia, melena, hematemesis, abdominal distention, abdominal pain, diarrhea, jaundice, pruritus or weight loss.  Last EGD: 08/27/2021 Empiric dilation performed with savory dilator up to 18 mm.  Biopsies were negative for EOE.  Single submucosal nodule measuring 10 mm in stomach.  Normal duodenum  Last Colonoscopy: 09/08/2020  - The examined portion of the ileum was normal. - Diverticulosis in the sigmoid colon. - The distal rectum and anal verge are normal on retroflexion view.  Past Medical History: Past Medical History:  Diagnosis Date   Aortic stenosis    a. s/p AVR on 05/18/2017 with 23 mm Edwards Magna-Ease Pericardial valve   Benign lipomatous neoplasm  of skin and subcutaneous tissue of right arm    Chronic systolic (congestive) heart failure (HCC)    a. 05/2017: TEE showing EF 40-45%   Dyspnea    GERD (gastroesophageal reflux disease)    Hypertension     Past Surgical History: Past Surgical History:  Procedure Laterality Date   AORTIC VALVE REPLACEMENT N/A 05/18/2017   Procedure: AORTIC VALVE REPLACEMENT (AVR),TEE;  Surgeon: Lucas Dorise POUR, MD;  [facility]: [facility] OR;  Service: Open Heart Surgery;  Laterality: N/A;   BIOPSY  08/27/2021   Procedure: BIOPSY;  Surgeon: Fortune Angelia Toribio, MD;  [facility];  Service: Gastroenterology;;   COLONOSCOPY WITH PROPOFOL  N/A 09/08/2020   Procedure: COLONOSCOPY WITH PROPOFOL ;  Surgeon: Fortune Angelia Toribio, MD;  [facility];  Service: Gastroenterology;  Laterality: N/A;  11:15 AM   ENDARTERECTOMY Right 01/19/2022   Procedure: RIGHT ENDARTERECTOMY CAROTID;  Surgeon: Serene Gaile ORN, MD;  Location: [facility] OR;  Service: Vascular;  Laterality: Right;   ESOPHAGEAL DILATION N/A 08/27/2021   Procedure: ESOPHAGEAL DILATION;  Surgeon: Fortune Angelia Toribio, MD;  [facility];  Service: Gastroenterology;  Laterality: N/A;   ESOPHAGOGASTRODUODENOSCOPY (EGD) WITH PROPOFOL  N/A 08/27/2021   Procedure: ESOPHAGOGASTRODUODENOSCOPY (EGD) WITH PROPOFOL ;  Surgeon: Fortune Angelia Toribio, MD;  [facility];  Service: Gastroenterology;  Laterality: N/A;  1215   HAND SURGERY     left hang following trauma   PATCH ANGIOPLASTY Right 01/19/2022   Procedure: PATCH ANGIOPLASTY OF RIGHT CAROTID ARTERY USING XENOSURE BOVINE BIOLOGIC PATCH;  Surgeon: Serene Gaile ORN, MD;  Location: [facility];  Service: Vascular;  Laterality:  Right;   RIGHT/LEFT HEART CATH AND CORONARY ANGIOGRAPHY N/A 04/12/2017   Procedure: RIGHT/LEFT HEART CATH AND CORONARY ANGIOGRAPHY;  Surgeon: Verlin Lonni BIRCH, MD;  [facility];  Service: Cardiovascular;  Laterality: N/A;   TEE WITHOUT  CARDIOVERSION N/A 05/18/2017   Procedure: TRANSESOPHAGEAL ECHOCARDIOGRAM (TEE);  Surgeon: Lucas Dorise POUR, MD;  Location: [facility] OR;  Service: Open Heart Surgery;  Laterality: N/A;    Family History: Family History  Problem Relation Age of Onset   Lung cancer Other        brother and father   Heart murmur Other        mother   Cancer Mother    Heart failure Mother    Cancer Father    Cancer Brother    Cirrhosis Brother    Alcohol abuse Brother     Social History: Social History   Tobacco Use  Smoking Status Former   Current packs/day: 1.00   Average packs/day: 1 pack/day for 10.0 years (10.0 ttl pk-yrs)   Types: Cigarettes   Passive exposure: Past  Smokeless Tobacco Former   Types: Snuff   Social History   Substance and Sexual Activity  Alcohol Use No   Social History   Substance and Sexual Activity  Drug Use No    Allergies: Allergies  Allergen Reactions   Oxycodone  Other (See Comments)    Makes me go crazy    Medications: Current Outpatient Medications  Medication Sig Dispense Refill   aspirin  EC 81 MG tablet Take 81 mg by mouth in the morning.     cetirizine (ZYRTEC) 10 MG tablet Take 10 mg by mouth in the morning.     lisinopril  (ZESTRIL ) 5 MG tablet Take 1 tablet (5 mg total) by mouth daily. 90 tablet 1   metoprolol  succinate (TOPROL -XL) 25 MG 24 hr tablet Take 1 tablet (25 mg total) by mouth daily. 90 tablet 1   Multiple Vitamin (MULTIVITAMIN WITH MINERALS) TABS tablet Take 1 tablet by mouth in the morning.     pantoprazole  (PROTONIX ) 40 MG tablet Take 40 mg by mouth in the morning. (Patient taking differently: Take 80 mg by mouth in the morning.)     rosuvastatin  (CRESTOR ) 40 MG tablet Take 1 tablet (40 mg total) by mouth at bedtime. 90 tablet 1   No current facility-administered medications for this visit.    Review of Systems: GENERAL: negative for malaise, night sweats HEENT: No changes in hearing or vision, no nose bleeds or other nasal  problems. NECK: Negative for lumps, goiter, pain and significant neck swelling RESPIRATORY: Negative for cough, wheezing CARDIOVASCULAR: Negative for chest pain, leg swelling, palpitations, orthopnea GI: SEE HPI MUSCULOSKELETAL: Negative for joint pain or swelling, back pain, and muscle pain. SKIN: Negative for lesions, rash PSYCH: Negative for sleep disturbance, mood disorder and recent psychosocial stressors. HEMATOLOGY Negative for prolonged bleeding, bruising easily, and swollen nodes. ENDOCRINE: Negative for cold or heat intolerance, polyuria, polydipsia and goiter. NEURO: negative for tremor, gait imbalance, syncope and seizures. The remainder of the review of systems is noncontributory.   Physical Exam: BP 124/75 (BP Location: Left Arm, Patient Position: Sitting, Cuff Size: Normal)   Pulse 66   Temp 98.2 F (36.8 C) (Temporal)   Ht 5' 8 (1.727 m)   Wt 170 lb 11.2 oz (77.4 kg)   BMI 25.95 kg/m  GENERAL: The patient is AO x3, in no acute distress. HEENT: Head is normocephalic and atraumatic. EOMI are intact. Mouth is well hydrated and without  lesions. NECK: Supple. No masses LUNGS: Clear to auscultation. No presence of rhonchi/wheezing/rales. Adequate chest expansion HEART: RRR, normal s1 and s2. ABDOMEN: Soft, nontender, no guarding, no peritoneal signs, and nondistended. BS +. No masses. EXTREMITIES: Without any cyanosis, clubbing, rash, lesions or edema. NEUROLOGIC: AOx3, no focal motor deficit. SKIN: no jaundice, no rashes  Imaging/Labs: as above  I personally reviewed and interpreted the available labs, imaging and endoscopic files.  Impression and Plan: SHRAGA CUSTARD is a 73 y.o. male  with past medical history of aortic stenosis status post aortic valve replacement in 2019, chronic systolic heart failure (most recent ejection fraction 60-65%) ,  who presents for follow up of dysphagia.  Patient has presented some recurrent issues with dysphagia.  He reports  having improvement after he underwent empiric dilation but his symptoms have somewhat recurred.  Due to this, we will proceed with repeat EGD with possible dilation with a larger savory dilator.  He is open to proceed with this.  He has presented improvement of his GERD with a higher dose of PPI.  However, he has not been taking this on an adequate fashion.  We discussed the possibility of continuing pantoprazole  at a lower dose but taking this at least 30 minutes before his breakfast.  He is agreeable to try this for a few weeks.  -Schedule EGD -Decrease pantoprazole  40 mg qday -Explained presumed etiology of reflux symptoms. Instruction provided in the use of antireflux medication - patient should take medication in the morning 30-45 minutes before eating breakfast. Discussed avoidance of eating within 2 hours of lying down to sleep and benefit of blocks to elevate head of bed. Also, will benefit from avoiding carbonated drinks/sodas or food that has tomatoes, spicy or greasy food.  All questions were answered.      Toribio Fortune, MD Gastroenterology and Hepatology [facility]

## 2024-02-05 NOTE — Telephone Encounter (Signed)
 Spoke with patient, scheduled egd for 02/14/2024 at 2:30pm, instructions sent through [facility].

## 2024-02-14 ENCOUNTER — Ambulatory Visit (HOSPITAL_COMMUNITY)
Admission: RE | Admit: 2024-02-14 | Discharge: 2024-02-14 | Disposition: A | Discharge status: Home / Self Care | Attending: Gastroenterology | Admitting: Gastroenterology

## 2024-02-14 ENCOUNTER — Ambulatory Visit (HOSPITAL_COMMUNITY): Admitting: Student

## 2024-02-14 ENCOUNTER — Encounter (HOSPITAL_COMMUNITY): Admission: RE | Disposition: A | Payer: Self-pay | Source: Home / Self Care | Attending: Gastroenterology

## 2024-02-14 ENCOUNTER — Encounter (HOSPITAL_COMMUNITY): Payer: Self-pay | Admitting: Gastroenterology

## 2024-02-14 ENCOUNTER — Other Ambulatory Visit: Payer: Self-pay

## 2024-02-14 DIAGNOSIS — Z951 Presence of aortocoronary bypass graft: Secondary | ICD-10-CM | POA: Insufficient documentation

## 2024-02-14 DIAGNOSIS — K3189 Other diseases of stomach and duodenum: Secondary | ICD-10-CM

## 2024-02-14 DIAGNOSIS — K5730 Diverticulosis of large intestine without perforation or abscess without bleeding: Secondary | ICD-10-CM | POA: Diagnosis not present

## 2024-02-14 DIAGNOSIS — I509 Heart failure, unspecified: Secondary | ICD-10-CM

## 2024-02-14 DIAGNOSIS — Z79899 Other long term (current) drug therapy: Secondary | ICD-10-CM | POA: Diagnosis not present

## 2024-02-14 DIAGNOSIS — I110 Hypertensive heart disease with heart failure: Secondary | ICD-10-CM | POA: Diagnosis not present

## 2024-02-14 DIAGNOSIS — K449 Diaphragmatic hernia without obstruction or gangrene: Secondary | ICD-10-CM | POA: Diagnosis not present

## 2024-02-14 DIAGNOSIS — Z87891 Personal history of nicotine dependence: Secondary | ICD-10-CM | POA: Diagnosis not present

## 2024-02-14 DIAGNOSIS — R1310 Dysphagia, unspecified: Secondary | ICD-10-CM | POA: Diagnosis not present

## 2024-02-14 DIAGNOSIS — K219 Gastro-esophageal reflux disease without esophagitis: Secondary | ICD-10-CM | POA: Insufficient documentation

## 2024-02-14 HISTORY — PX: ESOPHAGOGASTRODUODENOSCOPY: SHX5428

## 2024-02-14 SURGERY — EGD (ESOPHAGOGASTRODUODENOSCOPY)
Anesthesia: General

## 2024-02-14 MED ORDER — PROPOFOL 500 MG/50ML IV EMUL
INTRAVENOUS | Status: DC | PRN
  Administered 2024-02-14: 20 mg via INTRAVENOUS
  Administered 2024-02-14: 30 mg via INTRAVENOUS
  Administered 2024-02-14: 80 mg via INTRAVENOUS

## 2024-02-14 MED ORDER — LACTATED RINGERS IV SOLN: INTRAVENOUS | Status: DC | PRN

## 2024-02-14 MED ORDER — LACTATED RINGERS IV SOLN: INTRAVENOUS | Status: DC

## 2024-02-14 MED ORDER — LIDOCAINE 2% (20 MG/ML) 5 ML SYRINGE
INTRAMUSCULAR | Status: DC | PRN
Start: 2024-02-14 — End: 2024-02-14
  Administered 2024-02-14: 60 mg via INTRAVENOUS

## 2024-02-14 NOTE — Anesthesia Preprocedure Evaluation (Addendum)
 Anesthesia Evaluation  Patient identified by MRN, date of birth, ID band Patient awake    Reviewed: Allergy & Precautions, NPO status , Patient's Chart, lab work & pertinent test results, reviewed documented beta blocker date and time   Airway Mallampati: II  TM Distance: >3 FB Neck ROM: Full    Dental  (+) Upper Dentures, Lower Dentures   Pulmonary shortness of breath, former smoker   Pulmonary exam normal breath sounds clear to auscultation       Cardiovascular Exercise Tolerance: Good hypertension, Pt. on medications and Pt. on home beta blockers + CABG and +CHF  Normal cardiovascular exam+ Valvular Problems/Murmurs (AVR)  Rhythm:Regular Rate:Normal  AVR for severe AS. 60% EF with grade 1 diastolic dysfunction   Neuro/Psych Symptomatic right carotid stenosis negative neurological ROS  negative psych ROS   GI/Hepatic Neg liver ROS,GERD  Medicated and Controlled,,  Endo/Other  negative endocrine ROS    Renal/GU negative Renal ROS  negative genitourinary   Musculoskeletal negative musculoskeletal ROS (+)    Abdominal   Peds negative pediatric ROS (+)  Hematology negative hematology ROS (+)   Anesthesia Other Findings   Reproductive/Obstetrics negative OB ROS                              Anesthesia Physical Anesthesia Plan  ASA: 3  Anesthesia Plan: General   Post-op Pain Management: Minimal or no pain anticipated   Induction: Intravenous  PONV Risk Score and Plan: Propofol  infusion  Airway Management Planned: Nasal Cannula and Natural Airway  Additional Equipment:   Intra-op Plan:   Post-operative Plan:   Informed Consent: I have reviewed the patients History and Physical, chart, labs and discussed the procedure including the risks, benefits and alternatives for the proposed anesthesia with the patient or authorized representative who has indicated his/her understanding and  acceptance.       Plan Discussed with: CRNA and Surgeon  Anesthesia Plan Comments:          Anesthesia Quick Evaluation

## 2024-02-14 NOTE — Anesthesia Postprocedure Evaluation (Signed)
 Anesthesia Post Note  Patient: Jeremy Larson  Procedure(s) Performed: EGD (ESOPHAGOGASTRODUODENOSCOPY)  Patient location during evaluation: Endoscopy Anesthesia Type: General Level of consciousness: awake and alert Pain management: pain level controlled Vital Signs Assessment: post-procedure vital signs reviewed and stable Respiratory status: spontaneous breathing, nonlabored ventilation and respiratory function stable Cardiovascular status: stable Anesthetic complications: no   There were no known notable events for this encounter.   Last Vitals:  Vitals:   02/14/24 1356 02/14/24 1500  BP: (!) 160/67 (!) 113/43  Pulse:  74  Resp: 16 17  Temp: 36.7 C 36.6 C  SpO2: 97% 94%    Last Pain:  Vitals:   02/14/24 1500  TempSrc: Oral  PainSc:                  Carlin LITTIE Kawasaki

## 2024-02-14 NOTE — Transfer of Care (Signed)
 Immediate Anesthesia Transfer of Care Note  Patient: Jeremy Larson  Procedure(s) Performed: EGD (ESOPHAGOGASTRODUODENOSCOPY)  Patient Location: Short Stay  Anesthesia Type:General  Level of Consciousness: drowsy  Airway & Oxygen Therapy: Patient Spontanous Breathing  Post-op Assessment: Report given to RN and Post -op Vital signs reviewed and stable  Post vital signs: Reviewed and stable  Last Vitals:  Vitals Value Taken Time  BP 113/43 02/14/24 15:00  Temp 36.6 C 02/14/24 15:00  Pulse 74 02/14/24 15:00  Resp 17 02/14/24 15:00  SpO2 94 % 02/14/24 15:00    Last Pain:  Vitals:   02/14/24 1500  TempSrc: Oral  PainSc:       Patients Stated Pain Goal: 4 (02/14/24 1356)  Complications: No notable events documented.

## 2024-02-14 NOTE — Op Note (Signed)
 [facility] Patient Name: Jeremy Larson Procedure Date: 02/14/2024 2:42 PM MRN: [id number] Date of Birth: [birth year] Attending MD: Toribio Fortune , , [id number] CSN: [id number] Age: 73 Admit Type: Outpatient Procedure:                Upper GI endoscopy Indications:              Dysphagia Providers:                Toribio Fortune, Crystal Page, Alm Dorcas Balm., Technician Referring MD:              Medicines:                Monitored Anesthesia Care Complications:            No immediate complications. Estimated Blood Loss:     Estimated blood loss: none. Procedure:                Pre-Anesthesia Assessment:                           - Prior to the procedure, a History and Physical                            was performed, and patient medications, allergies                            and sensitivities were reviewed. The patient's                            tolerance of previous anesthesia was reviewed.                           - The risks and benefits of the procedure and the                            sedation options and risks were discussed with the                            patient. All questions were answered and informed                            consent was obtained.                           - ASA Grade Assessment: II - A patient with mild                            systemic disease.                           After obtaining informed consent, the endoscope was                            passed under direct vision. Throughout the  procedure, the patient's blood pressure, pulse, and                            oxygen saturations were monitored continuously. The                            [id number]-[id number] ([id number]) Upper was introduced through                            the mouth, and advanced to the second part of                            duodenum. The upper GI endoscopy was accomplished                             without difficulty. The patient tolerated the                            procedure well. Scope In: 2:52:53 PM Scope Out: 2:58:19 PM Total Procedure Duration: 0 hours 5 minutes 26 seconds  Findings:      No endoscopic abnormality was evident in the esophagus to explain the       patient's complaint of dysphagia. It was decided, however, to proceed       with dilation of the entire esophagus. A guidewire was placed and the       scope was withdrawn. Dilation was performed with a Savary dilator with       no resistance at 20 mm.      A 1 cm hiatal hernia was present.      A single 15 mm submucosal papule (nodule) with no bleeding was found in       the gastric body. The nodule was Paris classification IIa (superficial,       elevated).      The examined duodenum was normal. Impression:               - No endoscopic esophageal abnormality to explain                            patient's dysphagia. Esophagus dilated. Dilated.                           - 1 cm hiatal hernia.                           - No specimens collected. Moderate Sedation:      Per Anesthesia Care Recommendation:           - Discharge patient to home (ambulatory).                           - Resume previous diet.                           - Referral for EUS at [facility]. Procedure Code(s):        --- Professional ---  56751, Esophagogastroduodenoscopy, flexible,                            transoral; with insertion of guide wire followed by                            passage of dilator(s) through esophagus over guide                            wire Diagnosis Code(s):        --- Professional ---                           R13.10, Dysphagia, unspecified                           K44.9, Diaphragmatic hernia without obstruction or                            gangrene CPT copyright 2022 American [employer] Association. All rights reserved. The codes documented in this report are preliminary and upon  coder review may  be revised to meet current compliance requirements. Toribio Fortune, MD Toribio Fortune,  02/14/2024 3:09:12 PM This report has been signed electronically. Number of Addenda: 0

## 2024-02-14 NOTE — Discharge Instructions (Signed)
 You are being discharged to home.  Resume your previous diet.  Referral for EUS at [facility].

## 2024-02-14 NOTE — Interval H&P Note (Signed)
 History and Physical Interval Note:  02/14/2024 1:48 PM  Jeremy Larson  has presented today for surgery, with the diagnosis of dysphagia.  The various methods of treatment have been discussed with the patient and family. After consideration of risks, benefits and other options for treatment, the patient has consented to  Procedure(s) with comments: EGD (ESOPHAGOGASTRODUODENOSCOPY) (N/A) - 2:30pm, ASA 1-2 as a surgical intervention.  The patient's history has been reviewed, patient examined, no change in status, stable for surgery.  I have reviewed the patient's chart and labs.  Questions were answered to the patient's satisfaction.     Jeremy Larson

## 2024-02-15 ENCOUNTER — Telehealth (INDEPENDENT_AMBULATORY_CARE_PROVIDER_SITE_OTHER): Payer: Self-pay | Admitting: *Deleted

## 2024-02-15 ENCOUNTER — Encounter (HOSPITAL_COMMUNITY): Payer: Self-pay | Admitting: Gastroenterology

## 2024-02-15 NOTE — Telephone Encounter (Signed)
Message sent to [facility]

## 2024-02-15 NOTE — Telephone Encounter (Signed)
 Referral would be for submucosal gastric nodule, enlarging size.  Thanks Ann.

## 2024-02-15 NOTE — Telephone Encounter (Signed)
 Op note says refer for EUS to [facility] - what is diagnosis?

## 2024-02-22 ENCOUNTER — Telehealth: Payer: Self-pay

## 2024-02-22 ENCOUNTER — Other Ambulatory Visit: Payer: Self-pay

## 2024-02-22 DIAGNOSIS — K3189 Other diseases of stomach and duodenum: Secondary | ICD-10-CM

## 2024-02-22 NOTE — Telephone Encounter (Signed)
 Mansouraty, Aloha Raddle., MD  Eartha Angelia Sieving, MD; Anitra Odetta CROME, RN; Blanca Caldron H DCM, Thank you for this update.  Odetta, You may schedule this patient for December or January upper EUS.  Gastric nodule/subepithelial lesion. Thanks. GM

## 2024-02-22 NOTE — Telephone Encounter (Signed)
 EUS has been set up for 04/22/24 at [facility] with GM at 915 am

## 2024-02-22 NOTE — Telephone Encounter (Signed)
-----   Message from Toribio Fortune Mayorga sent at 02/16/2024  5:57 PM EDT ----- Regarding: RE:  Erskin Roers,  Thanks for your response. This is not urgent, so he can wait till December/January time.  Thanks  ----- Message ----- From: Wilhelmenia Roers Raddle., MD Sent: 02/16/2024   5:00 PM EDT To: Odetta LITTIE Curly, RN; Jenkins VEAR [place]; Daniel Cas#  DCM and AT, I am not able to accommodate this patient's EUS at this time. You will need to forward this out of the system unfortunately. If that is an issue for the patient, reach back out but it will not be scheduled until December or more likely January. Sorry. GM ----- Message ----- From: Curly Odetta LITTIE, RN Sent: 02/15/2024   9:55 AM EDT To: Jenkins VEAR Juanda Roers Wilhelmenia Raddle., MD   ----- Message ----- From: [place] Jenkins VEAR Sent: 02/15/2024   9:44 AM EDT To: Odetta LITTIE Curly, RN; Toribio Fortune Mayorga#  Per Dr Fortune - patient needs EUS for submucosal gastric nodule, enlarging size.. Thanks Jenkins

## 2024-02-23 NOTE — Telephone Encounter (Signed)
 Voice mail not set up will attempt later all information has been mailed and sent to My Chart

## 2024-02-26 NOTE — Telephone Encounter (Signed)
 Left message on machine to call back

## 2024-02-27 NOTE — Telephone Encounter (Signed)
EUS scheduled, pt instructed and medications reviewed.  Patient instructions mailed to home and sent to My Chart .  Patient to call with any questions or concerns.  

## 2024-02-28 ENCOUNTER — Encounter (INDEPENDENT_AMBULATORY_CARE_PROVIDER_SITE_OTHER): Payer: Self-pay | Admitting: Gastroenterology

## 2024-02-28 DIAGNOSIS — Z23 Encounter for immunization: Secondary | ICD-10-CM | POA: Diagnosis not present

## 2024-03-11 ENCOUNTER — Other Ambulatory Visit: Payer: Self-pay | Admitting: Cardiology

## 2024-03-16 ENCOUNTER — Other Ambulatory Visit: Payer: Self-pay | Admitting: Cardiology

## 2024-04-15 ENCOUNTER — Telehealth: Payer: Self-pay | Admitting: Gastroenterology

## 2024-04-15 ENCOUNTER — Encounter (HOSPITAL_COMMUNITY): Payer: Self-pay | Admitting: Gastroenterology

## 2024-04-15 NOTE — Progress Notes (Signed)
 Attempted to obtain medical history for pre op call via telephone, unable to reach at this time. HIPAA compliant voicemail message left requesting return call to pre surgical testing department.

## 2024-04-15 NOTE — Telephone Encounter (Addendum)
 Procedure:Upper EUS Procedure date: 04/22/24 Procedure location: [facility] Arrival Time: 7:45 am Spoke with the patient Y/N: Yes Any prep concerns? No  Has the patient obtained the prep from the pharmacy ? No prep needed Do you have a care partner and transportation: Yes Any additional concerns? No

## 2024-04-19 ENCOUNTER — Encounter (HOSPITAL_COMMUNITY): Payer: Self-pay | Admitting: Gastroenterology

## 2024-04-19 MED ORDER — SODIUM CHLORIDE 0.9 % IV SOLN: INTRAVENOUS | Status: AC

## 2024-04-22 ENCOUNTER — Other Ambulatory Visit: Payer: Self-pay

## 2024-04-22 ENCOUNTER — Encounter (HOSPITAL_COMMUNITY): Payer: Self-pay | Admitting: Gastroenterology

## 2024-04-22 ENCOUNTER — Encounter (HOSPITAL_COMMUNITY): Payer: Self-pay | Admitting: Registered Nurse

## 2024-04-22 ENCOUNTER — Ambulatory Visit (HOSPITAL_COMMUNITY): Payer: Self-pay | Admitting: Registered Nurse

## 2024-04-22 ENCOUNTER — Ambulatory Visit (HOSPITAL_COMMUNITY)
Admission: RE | Admit: 2024-04-22 | Discharge: 2024-04-22 | Disposition: A | Attending: Gastroenterology | Admitting: Gastroenterology

## 2024-04-22 ENCOUNTER — Encounter (HOSPITAL_COMMUNITY): Admission: RE | Disposition: A | Payer: Self-pay | Source: Home / Self Care | Attending: Gastroenterology

## 2024-04-22 DIAGNOSIS — K449 Diaphragmatic hernia without obstruction or gangrene: Secondary | ICD-10-CM | POA: Diagnosis not present

## 2024-04-22 DIAGNOSIS — Q399 Congenital malformation of esophagus, unspecified: Secondary | ICD-10-CM

## 2024-04-22 DIAGNOSIS — K2950 Unspecified chronic gastritis without bleeding: Secondary | ICD-10-CM

## 2024-04-22 DIAGNOSIS — K3189 Other diseases of stomach and duodenum: Secondary | ICD-10-CM

## 2024-04-22 DIAGNOSIS — K2289 Other specified disease of esophagus: Secondary | ICD-10-CM

## 2024-04-22 DIAGNOSIS — B9681 Helicobacter pylori [H. pylori] as the cause of diseases classified elsewhere: Secondary | ICD-10-CM

## 2024-04-22 DIAGNOSIS — K2970 Gastritis, unspecified, without bleeding: Secondary | ICD-10-CM

## 2024-04-22 DIAGNOSIS — K31A0 Gastric intestinal metaplasia, unspecified: Secondary | ICD-10-CM

## 2024-04-22 HISTORY — PX: ESOPHAGOGASTRODUODENOSCOPY: SHX5428

## 2024-04-22 HISTORY — PX: EUS: SHX5427

## 2024-04-22 SURGERY — EGD (ESOPHAGOGASTRODUODENOSCOPY)
Anesthesia: Monitor Anesthesia Care

## 2024-04-22 MED ORDER — PROPOFOL 1000 MG/100ML IV EMUL
INTRAVENOUS | Status: AC
  Filled 2024-04-22: qty 100

## 2024-04-22 MED ORDER — PROPOFOL 500 MG/50ML IV EMUL
INTRAVENOUS | Status: DC | PRN
  Administered 2024-04-22: 140 ug/kg/min via INTRAVENOUS
  Administered 2024-04-22: 20 mg via INTRAVENOUS

## 2024-04-22 MED ORDER — SODIUM CHLORIDE 0.9 % IV SOLN
INTRAVENOUS | Status: AC | PRN
  Administered 2024-04-22: 500 mL via INTRAMUSCULAR

## 2024-04-22 MED ORDER — SUCRALFATE 1 G PO TABS: 1.0000 g | ORAL_TABLET | Freq: Two times a day (BID) | ORAL | 0 refills | Status: AC

## 2024-04-22 MED ORDER — PROPOFOL 500 MG/50ML IV EMUL
INTRAVENOUS | Status: AC
  Filled 2024-04-22: qty 50

## 2024-04-22 NOTE — Op Note (Signed)
 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 [place], CRNA Referring MD:              Medicines:                Monitored Anesthesia Care Complications:            No immediate complications. Estimated Blood Loss:     Estimated blood loss was minimal. Procedure:                Pre-Anesthesia Assessment:                           - Prior to the procedure, a History and Physical                            was performed, and patient medications and                            allergies were reviewed. The patient's tolerance of                            previous anesthesia was also reviewed. The risks                            and benefits of the procedure and the sedation                            options and risks were discussed with the patient.                            All questions were answered, and informed consent                            was obtained. Prior Anticoagulants: The patient has                            taken no anticoagulant or antiplatelet agents                            except for aspirin . ASA Grade Assessment: III - A  patient with severe systemic disease. After                            reviewing the risks and benefits, the patient was                            deemed in satisfactory condition to undergo the                            procedure.                           After obtaining informed consent, the endoscope was                             passed under direct vision. Throughout the                            procedure, the patient's blood pressure, pulse, and                            oxygen saturations were monitored continuously. The                            GIF-H190 ([id number]) Olympus endoscope was introduced                            through the mouth, and advanced to the second part                            of duodenum. The GF-UE160-AL5 ([id number]) Olympus                            endosonoscope was introduced through the mouth, and                            advanced to the duodenum for ultrasound examination                            from the esophagus, stomach and duodenum. The upper                            EUS was accomplished without difficulty. The                            patient tolerated the procedure. The GF-[id number]                            ([id number]) Olympus endosonoscope was introduced                            through the mouth, and advanced to the duodenum for  ultrasound examination from the esophagus, stomach                            and duodenum. Scope In: Scope Out: Findings:      ENDOSCOPIC FINDING: :      The distal esophagus was mildly tortuous.      No gross mucosal lesions were noted in the entire esophagus.      The Z-line was irregular and was found 35 cm from the incisors.      A 1 cm hiatal hernia was present.      Extrinsic impression versus subepithelial lesion was found on the lesser       curvature of the stomach (approximately 52 cm from incisors).      Diffuse moderately erythematous mucosa without bleeding was found in the       entire examined stomach. Biopsies were taken with a cold forceps for       histology and Helicobacter pylori testing.      No gross lesions were noted in the duodenal bulb, in the first portion       of the duodenum and in the second portion of the duodenum.      The major papilla was normal.      ENDOSONOGRAPHIC  FINDING: :      Endosonographic imaging in the entire stomach showed no intramural       (subepithelial) lesions.      There was extrinsic compression in the lesser curve of the stomach (at       52 cm from incisors). Endosonographic examination showed this       compression to be due to a normal-appearing liver.      Endosonographic imaging in the rest of the visualized portion of the       liver showed no mass.      There was no sign of significant endosonographic abnormality in the       pancreatic head (PD = 1.8 mm), genu of the pancreas (PD = 1.5 mm),       pancreatic body (PD = 1.2 mm) and pancreatic tail (PD = 0.8 mm). No       masses, no cysts, no calcifications, the pancreatic duct was thin in       caliber.      There was no sign of significant endosonographic abnormality in the       common bile duct (3.5 mm). An unremarkable gallbladder, no stones and       ducts with regular contour were identified.      Endosonographic imaging of the ampulla showed no intramural       (subepithelial) lesion.      An anechoic lesion suggestive of a cyst was identified in the visualized       portion of the left kidney. There was a single compartment without       septae.      No malignant-appearing lymph nodes were visualized in the celiac region       (level 20), perigastric region and porta hepatis region.      Endosonographic imaging in the gastroesophageal junction showed no       intramural (subepithelial) lesion.      The celiac region was visualized.      The esophagus, stomach and duodenum were examined endosonographically. Impression:               EGD impression:                           -  Tortuous distal esophagus. No gross mucosal                            lesions in the entire esophagus. Z-line irregular,                            35 cm from the incisors.                           - 1 cm hiatal hernia.                           - Extrinsic impression versus subepithelial  lesion                            at the lesser curvature of the stomach.                           - Erythematous mucosa in the stomach. Biopsied.                           - No gross lesions in the duodenal bulb, in the                            first portion of the duodenum and in the second                            portion of the duodenum.                           - Normal major papilla.                           EUS impression:                           - Extrinsic compression was noted in the lesser                            curve of the stomach due to a normal-appearing                            liver. There is no evidence of any true                            subepithelial lesion.                           - There was no sign of significant pathology in the                            pancreatic head, genu of the pancreas, pancreatic                            body and pancreatic tail.                           -  There was no sign of significant pathology in the                            common bile duct. The gallbladder showed no                            evidence of cholelithiasis.                           - A cystic lesion was identified in the left kidney.                           - No malignant-appearing lymph nodes were                            visualized in the celiac region (level 20),                            perigastric region and porta hepatis region. Moderate Sedation:      Not Applicable - Patient had care per Anesthesia. Recommendation:           - The patient will be observed post-procedure,                            until all discharge criteria are met.                           - Discharge patient to home.                           - Patient has a contact number available for                            emergencies. The signs and symptoms of potential                            delayed complications were discussed with the                             patient. Return to normal activities tomorrow.                            Written discharge instructions were provided to the                            patient.                           - Resume previous diet.                           - Observe patient's clinical course.                           - Await path results.                           -  As the area in question is just extrinsic                            compression of the left lobe of the liver, no                            additional follow-up is required at this endoscopic                            finding.                           - Continue PPI as per primary GI.                           - Carafate  1 g twice daily for 1 month to aid in                            healing of the gastric mucosa.                           - The findings and recommendations were discussed                            with the patient.                           - The findings and recommendations were discussed                            with the patient's family. Procedure Code(s):        --- Professional ---                           [id number], Esophagogastroduodenoscopy, flexible,                            transoral; with endoscopic ultrasound examination,                            including the esophagus, stomach, and either the                            duodenum or a surgically altered stomach where the                            jejunum is examined distal to the anastomosis                           43239, Esophagogastroduodenoscopy, flexible,                            transoral; with biopsy, single or multiple Diagnosis Code(s):        --- Professional ---  Q39.9, Congenital malformation of esophagus,                            unspecified                           N28.89, Other specified disorders of kidney and                            ureter                           K22.89, Other specified disease of  esophagus                           K44.9, Diaphragmatic hernia without obstruction or                            gangrene                           K31.89, Other diseases of stomach and duodenum                           I89.9, Noninfective disorder of lymphatic vessels                            and lymph nodes, unspecified CPT copyright 2022 American Medical Association. All rights reserved. The codes documented in this report are preliminary and upon coder review may  be revised to meet current compliance requirements. Aloha Finner, MD 04/22/2024 9:36:55 AM Number of Addenda: 0

## 2024-04-22 NOTE — Anesthesia Procedure Notes (Signed)
 Procedure Name: MAC Date/Time: 04/22/2024 8:52 AM  Performed by: Memory Armida LABOR, CRNAPre-anesthesia Checklist: Patient identified, Emergency Drugs available, Suction available, Patient being monitored and Timeout performed Patient Re-evaluated:Patient Re-evaluated prior to induction Oxygen Delivery Method: Simple face mask Placement Confirmation: CO2 detector Dental Injury: Teeth and Oropharynx as per pre-operative assessment

## 2024-04-22 NOTE — H&P (Signed)
 GASTROENTEROLOGY PROCEDURE H&P NOTE   Primary Care Physician: Larson Jerel MATSU, MD  HPI: Jeremy Larson is a 73 y.o. male   Past Medical History:  Diagnosis Date   Aortic stenosis    a. 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 05/2017: TEE showing EF 40-45%   Dyspnea    GERD (gastroesophageal reflux disease)    Hypertension    Past Surgical History:  Procedure Laterality Date   AORTIC VALVE REPLACEMENT N/A 05/18/2017   Procedure: AORTIC VALVE REPLACEMENT (AVR),TEE;  Surgeon: Lucas Dorise POUR, MD;  Location: [facility] OR;  Service: Open Heart Surgery;  Laterality: N/A;   BIOPSY  08/27/2021   Procedure: BIOPSY;  Surgeon: Jeremy Angelia Toribio, MD;  Location: [facility];  Service: Gastroenterology;;   COLONOSCOPY WITH PROPOFOL  N/A 09/08/2020   Procedure: COLONOSCOPY WITH PROPOFOL ;  Surgeon: Jeremy Angelia Toribio, MD;  Location: [facility];  Service: Gastroenterology;  Laterality: N/A;  11:15 AM   ENDARTERECTOMY Right 01/19/2022   Procedure: RIGHT ENDARTERECTOMY CAROTID;  Surgeon: Serene Gaile ORN, MD;  Location: [facility] OR;  Service: Vascular;  Laterality: Right;   ESOPHAGEAL DILATION N/A 08/27/2021   Procedure: ESOPHAGEAL DILATION;  Surgeon: Jeremy Angelia Toribio, MD;  Location: [facility];  Service: Gastroenterology;  Laterality: N/A;   ESOPHAGOGASTRODUODENOSCOPY N/A 02/14/2024   Procedure: EGD (ESOPHAGOGASTRODUODENOSCOPY);  Surgeon: Jeremy Angelia, Toribio, MD;  Location: [facility];  Service: Gastroenterology;  Laterality: N/A;  2:30pm, ASA 1-2   ESOPHAGOGASTRODUODENOSCOPY (EGD) WITH PROPOFOL  N/A 08/27/2021   Procedure: ESOPHAGOGASTRODUODENOSCOPY (EGD) WITH PROPOFOL ;  Surgeon: Jeremy Angelia Toribio, MD;  Location: [facility];  Service: Gastroenterology;  Laterality: N/A;  1215   HAND SURGERY     left hang  following trauma   PATCH ANGIOPLASTY Right 01/19/2022   Procedure: PATCH ANGIOPLASTY OF RIGHT CAROTID ARTERY USING XENOSURE BOVINE BIOLOGIC PATCH;  Surgeon: Serene Gaile ORN, MD;  Location: [facility] OR;  Service: Vascular;  Laterality: Right;   RIGHT/LEFT HEART CATH AND CORONARY ANGIOGRAPHY N/A 04/12/2017   Procedure: RIGHT/LEFT HEART CATH AND CORONARY ANGIOGRAPHY;  Surgeon: Verlin Lonni BIRCH, MD;  Location: [facility];  Service: Cardiovascular;  Laterality: N/A;   TEE WITHOUT CARDIOVERSION N/A 05/18/2017   Procedure: TRANSESOPHAGEAL ECHOCARDIOGRAM (TEE);  Surgeon: Lucas Dorise POUR, MD;  Location: [facility] OR;  Service: Open Heart Surgery;  Laterality: N/A;   Current Facility-Administered Medications  Medication Dose Route Frequency Provider Last Rate Last Admin   0.9 %  sodium chloride  infusion    Continuous PRN Mansouraty, Aloha Raddle., MD 10 mL/hr at 04/22/24 0722 500 mL at 04/22/24 9277    Current Facility-Administered Medications:    0.9 %  sodium chloride  infusion, , , Continuous PRN, Mansouraty, Aloha Raddle., MD, Last Rate: 10 mL/hr at 04/22/24 0722, 500 mL at 04/22/24 9277 Allergies  Allergen Reactions   Oxycodone  Other (See Comments)    Makes me go crazy   Family History  Problem Relation Age of Onset   Lung cancer Other        brother and father   Heart murmur Other        mother   Cancer Mother    Heart failure Mother    Cancer Father    Cancer Brother    Cirrhosis Brother    Alcohol abuse Brother    Social History  Socioeconomic History   Marital status: Significant Other    Spouse name: Not on file   Number of children: Not on file   Years of education: Not on file   Highest education level: Not on file  Occupational History   Not on file  Tobacco Use   Smoking status: Former    Current packs/day: 1.00    Average packs/day: 1 pack/day for 10.0 years (10.0 ttl pk-yrs)    Types: Cigarettes    Passive exposure: Past   Smokeless tobacco: Former    Types: Snuff   Vaping Use   Vaping status: Never Used  Substance and Sexual Activity   Alcohol use: No   Drug use: No   Sexual activity: Not on file  Other Topics Concern   Not on file  Social History Narrative   Not on file   Social Drivers of Health   Financial Resource Strain: Not on file  Food Insecurity: Not on file  Transportation Needs: Not on file  Physical Activity: Not on file  Stress: Not on file  Social Connections: Not on file  Intimate Partner Violence: Not on file    Physical Exam: Today's Vitals   04/22/24 0716  BP: (!) 141/79  Pulse: 69  Resp: 15  Temp: 97.6 F (36.4 C)  TempSrc: Temporal  SpO2: 97%  Weight: 78.5 kg  Height: 5' 9 (1.753 m)   Body mass index is 25.55 kg/m. GEN: NAD EYE: Sclerae anicteric ENT: MMM CV: Non-tachycardic GI: Soft, NT/ND NEURO:  Alert & Oriented x 3  Lab Results: No results for input(s): WBC, HGB, HCT, PLT in the last 72 hours. BMET No results for input(s): NA, K, CL, CO2, GLUCOSE, BUN, CREATININE, CALCIUM  in the last 72 hours. LFT No results for input(s): PROT, ALBUMIN , AST, ALT, ALKPHOS, BILITOT, BILIDIR, IBILI in the last 72 hours. PT/INR No results for input(s): LABPROT, INR in the last 72 hours.   Impression / Plan: This is a 73 y.o.male who presents for Upper EUS to evaluate gastric nodule.  The risks of an EUS including intestinal perforation, bleeding, infection, aspiration, and medication effects were discussed as was the possibility it may not give a definitive diagnosis if a biopsy is performed.  When a biopsy of the pancreas is done as part of the EUS, there is an additional risk of pancreatitis at the rate of about 1-2%.  It was explained that procedure related pancreatitis is typically mild, although it can be severe and even life threatening, which is why we do not perform random pancreatic biopsies and only biopsy a lesion/area we feel is concerning enough to warrant  the risk.   The risks and benefits of endoscopic evaluation/treatment were discussed with the patient and/or family; these include but are not limited to the risk of perforation, infection, bleeding, missed lesions, lack of diagnosis, severe illness requiring hospitalization, as well as anesthesia and sedation related illnesses.  The patient's history has been reviewed, patient examined, no change in status, and deemed stable for procedure.  The patient and/or family was provided an opportunity to ask questions and all were answered.  The patient and/or family is agreeable to proceed.    Aloha Finner, MD [facility] Office # [phone]

## 2024-04-22 NOTE — Discharge Instructions (Signed)

## 2024-04-22 NOTE — Transfer of Care (Signed)
 Immediate Anesthesia Transfer of Care Note  Patient: Jeremy Larson  Procedure(s) Performed: ULTRASOUND, UPPER GI TRACT, ENDOSCOPIC EGD (ESOPHAGOGASTRODUODENOSCOPY)  Patient Location: PACU and Endoscopy Unit  Anesthesia Type:MAC  Level of Consciousness: drowsy and patient cooperative  Airway & Oxygen Therapy: Patient Spontanous Breathing and Patient connected to face mask oxygen  Post-op Assessment: Report given to RN, Post -op Vital signs reviewed and stable, and Patient moving all extremities  Post vital signs: Reviewed and stable  Last Vitals:  Vitals Value Taken Time  BP 90/48 04/22/24 09:31  Temp    Pulse 79 04/22/24 09:32  Resp 17 04/22/24 09:32  SpO2 97 % 04/22/24 09:32  Vitals shown include unfiled device data.  Last Pain:  Vitals:   04/22/24 0716  TempSrc: Temporal         Complications: No notable events documented.

## 2024-04-22 NOTE — Anesthesia Preprocedure Evaluation (Signed)
 Anesthesia Evaluation  Patient identified by MRN, date of birth, ID band Patient awake    Reviewed: Allergy & Precautions, NPO status , Patient's Chart, lab work & pertinent test results, reviewed documented beta blocker date and time   Airway Mallampati: II  TM Distance: >3 FB Neck ROM: Full    Dental  (+) Dental Advisory Given, Lower Dentures, Upper Dentures   Pulmonary former smoker   Pulmonary exam normal breath sounds clear to auscultation       Cardiovascular hypertension, Pt. on home beta blockers and Pt. on medications (-) angina +CHF  Normal cardiovascular exam+ Valvular Problems/Murmurs (s/p AVR)  Rhythm:Regular Rate:Normal  Echo 04/12/23: 1. Left ventricular ejection fraction, by estimation, is 60 to 65%. The  left ventricle has normal function. The left ventricle has no regional  wall motion abnormalities. There is moderate concentric left ventricular  hypertrophy. Left ventricular  diastolic parameters are consistent with Grade I diastolic dysfunction  (impaired relaxation).   2. Right ventricular systolic function is normal. The right ventricular  size is normal. Tricuspid regurgitation signal is inadequate for assessing  PA pressure.   3. The mitral valve is degenerative. Trivial mitral valve regurgitation.  The mean mitral valve gradient is 1.0 mmHg.   4. The aortic valve has been repaired/replaced. Aortic valve  regurgitation is trivial. There is a 23 mm Edwards Magna-ease pericardial  valve bioprosthetic valve present in the aortic position. Procedure Date:  2019. Aortic valve mean gradient measures  9.5 mmHg.   5. The inferior vena cava is normal in size with greater than 50%  respiratory variability, suggesting right atrial pressure of 3 mmHg.     Neuro/Psych negative neurological ROS  negative psych ROS   GI/Hepatic Neg liver ROS,GERD  Medicated,,  Endo/Other  negative endocrine ROS     Renal/GU negative Renal ROS     Musculoskeletal negative musculoskeletal ROS (+)    Abdominal   Peds  Hematology negative hematology ROS (+)   Anesthesia Other Findings Day of surgery medications reviewed with the patient.  Reproductive/Obstetrics                              Anesthesia Physical Anesthesia Plan  ASA: 3  Anesthesia Plan: MAC   Post-op Pain Management: Minimal or no pain anticipated   Induction: Intravenous  PONV Risk Score and Plan: 1 and TIVA and Treatment may vary due to age or medical condition  Airway Management Planned: Natural Airway and Simple Face Mask  Additional Equipment:   Intra-op Plan:   Post-operative Plan:   Informed Consent: I have reviewed the patients History and Physical, chart, labs and discussed the procedure including the risks, benefits and alternatives for the proposed anesthesia with the patient or authorized representative who has indicated his/her understanding and acceptance.     Dental advisory given  Plan Discussed with: CRNA  Anesthesia Plan Comments:         Anesthesia Quick Evaluation

## 2024-04-23 NOTE — Anesthesia Postprocedure Evaluation (Signed)
 Anesthesia Post Note  Patient: Jeremy Larson  Procedure(s) Performed: ULTRASOUND, UPPER GI TRACT, ENDOSCOPIC EGD (ESOPHAGOGASTRODUODENOSCOPY)     Patient location during evaluation: Endoscopy Anesthesia Type: MAC Level of consciousness: awake and alert Pain management: pain level controlled Vital Signs Assessment: post-procedure vital signs reviewed and stable Respiratory status: spontaneous breathing, nonlabored ventilation, respiratory function stable and patient connected to nasal cannula oxygen Cardiovascular status: stable and blood pressure returned to baseline Postop Assessment: no apparent nausea or vomiting Anesthetic complications: no   No notable events documented.  Last Vitals:  Vitals:   04/22/24 0956 04/22/24 1000  BP: 109/66 125/70  Pulse: 70 65  Resp: 20 20  Temp:    SpO2: 90% 92%    Last Pain:  Vitals:   04/22/24 1000  TempSrc:   PainSc: 0-No pain                 Garnette FORBES Skillern

## 2024-04-24 ENCOUNTER — Ambulatory Visit: Payer: Self-pay | Admitting: Gastroenterology

## 2024-04-24 ENCOUNTER — Other Ambulatory Visit: Payer: Self-pay

## 2024-04-24 ENCOUNTER — Encounter (HOSPITAL_COMMUNITY): Payer: Self-pay | Admitting: Gastroenterology

## 2024-04-24 LAB — SURGICAL PATHOLOGY

## 2024-04-24 MED ORDER — BISMUTH SUBSALICYLATE 262 MG PO TABS: 2.0000 | ORAL_TABLET | Freq: Four times a day (QID) | ORAL | 0 refills | Status: AC

## 2024-04-24 MED ORDER — TETRACYCLINE HCL 500 MG PO CAPS: 500.0000 mg | ORAL_CAPSULE | Freq: Four times a day (QID) | ORAL | 0 refills | Status: AC

## 2024-04-24 MED ORDER — OMEPRAZOLE 20 MG PO CPDR: 20.0000 mg | DELAYED_RELEASE_CAPSULE | Freq: Two times a day (BID) | ORAL | 0 refills | Status: AC

## 2024-04-24 MED ORDER — METRONIDAZOLE 250 MG PO TABS: 500.0000 mg | ORAL_TABLET | Freq: Four times a day (QID) | ORAL | 0 refills | Status: AC

## 2024-04-29 NOTE — Progress Notes (Signed)
 Jeremy Larson                                          MRN: [id number]   04/29/2024   The VBCI Quality Team Specialist reviewed this patient medical record for the purposes of chart review for care gap closure. The following were reviewed: chart review for care gap closure-glycemic status assessment.    VBCI Quality Team

## 2024-04-29 NOTE — Progress Notes (Signed)
 Jeremy Larson                                          MRN: [id number]   04/29/2024   The VBCI Quality Team Specialist reviewed this patient medical record for the purposes of chart review for care gap closure. The following were reviewed: chart review for care gap closure-kidney health evaluation for diabetes:eGFR  and uACR.    VBCI Quality Team

## 2024-05-01 ENCOUNTER — Ambulatory Visit: Attending: Student | Admitting: Student

## 2024-05-01 ENCOUNTER — Encounter: Payer: Self-pay | Admitting: Student

## 2024-05-01 VITALS — BP 126/74 | HR 83 | Ht 68.0 in | Wt 171.0 lb

## 2024-05-01 DIAGNOSIS — I2510 Atherosclerotic heart disease of native coronary artery without angina pectoris: Secondary | ICD-10-CM | POA: Diagnosis not present

## 2024-05-01 DIAGNOSIS — Z952 Presence of prosthetic heart valve: Secondary | ICD-10-CM | POA: Diagnosis not present

## 2024-05-01 DIAGNOSIS — I10 Essential (primary) hypertension: Secondary | ICD-10-CM | POA: Diagnosis not present

## 2024-05-01 DIAGNOSIS — I6523 Occlusion and stenosis of bilateral carotid arteries: Secondary | ICD-10-CM | POA: Diagnosis not present

## 2024-05-01 DIAGNOSIS — E782 Mixed hyperlipidemia: Secondary | ICD-10-CM | POA: Diagnosis not present

## 2024-05-01 NOTE — Patient Instructions (Signed)
 Medication Instructions:  Your physician recommends that you continue on your current medications as directed. Please refer to the Current Medication list given to you today.  *If you need a refill on your cardiac medications before your next appointment, please call your pharmacy*  Lab Work: NONE   If you have labs (blood work) drawn today and your tests are completely normal, you will receive your results only by: MyChart Message (if you have MyChart) OR A paper copy in the mail If you have any lab test that is abnormal or we need to change your treatment, we will call you to review the results.  Testing/Procedures: NONE   Follow-Up: At [facility], you and your health needs are our priority.  As part of our continuing mission to provide you with exceptional heart care, our providers are all part of one team.  This team includes your primary Cardiologist (physician) and Advanced Practice Providers or APPs (Physician Assistants and Nurse Practitioners) who all work together to provide you with the care you need, when you need it.  Your next appointment:   1 year(s)  Provider:   Dorn Ross, MD    We recommend signing up for the patient portal called MyChart.  Sign up information is provided on this After Visit Summary.  MyChart is used to connect with patients for Virtual Visits (Telemedicine).  Patients are able to view lab/test results, encounter notes, upcoming appointments, etc.  Non-urgent messages can be sent to your provider as well.   To learn more about what you can do with MyChart, go to [email or web address].   Other Instructions Thank you for choosing [facility]!

## 2024-05-01 NOTE — Progress Notes (Signed)
 Cardiology Office Note    Date:  05/01/2024  ID:  KARTHIK WHITTINGHILL, DOB [birth year], MRN [id number] Cardiologist: Alvan Carrier, MD { :  History of Present Illness:    Jeremy Larson is a 73 y.o. male with past medical history of bicuspid AV/severe AS (s/p AVR with 23 mm Edwards Magna-Ease Pericardial Valve in 05/2017), HFimpEF (EF 40-45% in 05/2017, normalized by repeat echo in 07/2017), CAD (prior cardiac catheterization in 03/2017 showing mild, nonobstructive CAD), HTN, HLD and carotid artery stenosis (s/p R CEA in 01/2022) who presents to the office today for annual follow-up.  He was examined by Dr. Alvan in 03/2023 and denied any recent chest pain or dyspnea on exertion at that time. Given that he was 5 years out from surgery, a follow-up echocardiogram was recommended and if benign, would repeat an echocardiogram in 5 years. He was continued on his current cardiac medications of ASA 81 mg daily, Lisinopril  5 mg daily, Toprol -XL 25 mg daily and Crestor  40 mg daily.  Repeat echocardiogram showed a preserved EF of 60 to 65% with no regional wall motion abnormalities. He did have moderate LVH, grade 1 diastolic dysfunction, normal RV function, trivial MR and his bioprosthetic aortic valve was functioning normally with trivial AI. Carotid Dopplers showed 1 to 39% stenosis bilaterally.  In talking with the patient today, he reports feeling great since his last office visit. He remains active around his home and enjoys going fishing routinely.  Also goes for walks. He denies any recent chest pain or dyspnea on exertion with this. No recent palpitations, orthopnea, PND or pitting edema. He was recently diagnosed with H. pylori and has been started on medical therapy for this.  Studies Reviewed:   EKG: EKG is ordered today and demonstrates:   EKG Interpretation Date/Time:  Wednesday May 01 2024 15:13:50 EST Ventricular Rate:  73 PR Interval:  174 QRS Duration:  80 QT  Interval:  380 QTC Calculation: 418 R Axis:   40  Text Interpretation: Normal sinus rhythm Normal ECG When compared with ECG of 03-Apr-2023 14:02, No significant change was found Confirmed by Johnson Grate ([id number]) on 05/01/2024 3:18:40 PM       Echocardiogram: 03/2023 IMPRESSIONS     1. Left ventricular ejection fraction, by estimation, is 60 to 65%. The  left ventricle has normal function. The left ventricle has no regional  wall motion abnormalities. There is moderate concentric left ventricular  hypertrophy. Left ventricular  diastolic parameters are consistent with Grade I diastolic dysfunction  (impaired relaxation).   2. Right ventricular systolic function is normal. The right ventricular  size is normal. Tricuspid regurgitation signal is inadequate for assessing  PA pressure.   3. The mitral valve is degenerative. Trivial mitral valve regurgitation.  The mean mitral valve gradient is 1.0 mmHg.   4. The aortic valve has been repaired/replaced. Aortic valve  regurgitation is trivial. There is a 23 mm Edwards Magna-ease pericardial  valve bioprosthetic valve present in the aortic position. Procedure Date:  2019. Aortic valve mean gradient measures  9.5 mmHg.   5. The inferior vena cava is normal in size with greater than 50%  respiratory variability, suggesting right atrial pressure of 3 mmHg.   Comparison(s): Prior images unable to be directly viewed.   Carotid Dopplers: 03/2023 Summary:  Right Carotid: Velocities in the right ICA are consistent with a 1-39%  stenosis.                Non-hemodynamically significant plaque <50%  noted in the  CCA. The                 ECA appears <50% stenosed.   Left Carotid: Velocities in the left ICA are consistent with a 1-39%  stenosis.               Non-hemodynamically significant plaque <50% noted in the  CCA. The                ECA appears <50% stenosed.   Vertebrals:  Bilateral vertebral arteries demonstrate antegrade  flow.  Subclavians: Left subclavian artery flow was disturbed. Normal flow  hemodynamics              were seen in the right subclavian artery.   *See table(s) above for measurements and observations.  Suggest follow up study in 12 months.    Physical Exam:   VS:  BP 126/74   Pulse 83   Ht 5' 8 (1.727 m)   Wt 171 lb (77.6 kg)   SpO2 95%   BMI 26.00 kg/m    Wt Readings from Last 3 Encounters:  05/01/24 171 lb (77.6 kg)  04/22/24 173 lb (78.5 kg)  02/05/24 170 lb 11.2 oz (77.4 kg)     GEN: Well nourished, well developed male appearing in no acute distress NECK: No JVD; No carotid bruits CARDIAC: RRR, 2/6 SEM along RUSB.  RESPIRATORY:  Clear to auscultation without rales, wheezing or rhonchi  ABDOMEN: Appears non-distended. No obvious abdominal masses. EXTREMITIES: No clubbing or cyanosis. No pitting edema.  Distal pedal pulses are 2+ bilaterally.   Assessment and Plan:   1. S/P AVR (aortic valve replacement) - He is s/p AVR with 23 mm Edwards Magna-Ease Pericardial Valve in 05/2017. Recent echocardiogram in 03/2023 showed his bioprosthetic aortic valve was functioning normally with only trivial AI. Would plan for follow-up imaging in 5 years unless clinically indicated in the interim.  2. Coronary artery disease involving native coronary artery of native heart without angina pectoris - Prior cardiac catheterization in 2018 showed mild, nonobstructive CAD. He remains active at baseline and denies any recent anginal symptoms.  Continue current medical therapy with ASA 81 mg daily and Crestor  40 mg daily.  3. Essential hypertension - BP was initially recorded at 146/80, rechecked and improved to 126/74. Continue current medical therapy with Toprol -XL 25 mg daily. He is no longer on Lisinopril  as it appears this was discontinued presumably by his PCP in the interim.  4. Mixed hyperlipidemia - He reports labs have been checked by his PCP and were overall well-controlled. We will  request a copy of these. Continue current medical therapy with Crestor  40 mg daily.  5. Bilateral carotid artery stenosis - He previously underwent R CEA in 01/2022. Dopplers in 03/2023 showed 1 to 39% stenosis bilaterally. Continue ASA and statin therapy.  Signed, Laymon CHRISTELLA Qua, PA-C

## 2024-05-20 ENCOUNTER — Other Ambulatory Visit: Payer: Self-pay | Admitting: Gastroenterology

## 2024-05-21 ENCOUNTER — Ambulatory Visit: Admitting: Physician Assistant

## 2024-06-13 ENCOUNTER — Other Ambulatory Visit: Payer: Self-pay | Admitting: Cardiology

## 2024-06-14 NOTE — Progress Notes (Signed)
 Jeremy Larson                                          MRN: [id number]   06/14/2024   The VBCI Quality Team Specialist reviewed this patient medical record for the purposes of chart review for care gap closure. The following were reviewed: chart review for care gap closure-glycemic status assessment.    VBCI Quality Team
